# Patient Record
Sex: Male | Born: 1945 | Race: Black or African American | Hispanic: No | Marital: Married | Smoking: Former smoker
Health system: Southern US, Community
[De-identification: ages and names within clinical notes are randomized; demographics above are authoritative.]

## PROBLEM LIST (undated history)

## (undated) DIAGNOSIS — Z9289 Personal history of other medical treatment: Secondary | ICD-10-CM

## (undated) DIAGNOSIS — I10 Essential (primary) hypertension: Secondary | ICD-10-CM

## (undated) DIAGNOSIS — I5043 Acute on chronic combined systolic (congestive) and diastolic (congestive) heart failure: Secondary | ICD-10-CM

## (undated) DIAGNOSIS — IMO0001 Reserved for inherently not codable concepts without codable children: Secondary | ICD-10-CM

## (undated) DIAGNOSIS — W3400XA Accidental discharge from unspecified firearms or gun, initial encounter: Secondary | ICD-10-CM

## (undated) DIAGNOSIS — Z87442 Personal history of urinary calculi: Secondary | ICD-10-CM

## (undated) DIAGNOSIS — S21339A Puncture wound without foreign body of unspecified front wall of thorax with penetration into thoracic cavity, initial encounter: Secondary | ICD-10-CM

## (undated) DIAGNOSIS — M1990 Unspecified osteoarthritis, unspecified site: Secondary | ICD-10-CM

## (undated) DIAGNOSIS — K759 Inflammatory liver disease, unspecified: Secondary | ICD-10-CM

## (undated) DIAGNOSIS — R768 Other specified abnormal immunological findings in serum: Secondary | ICD-10-CM

## (undated) DIAGNOSIS — I4891 Unspecified atrial fibrillation: Secondary | ICD-10-CM

## (undated) DIAGNOSIS — I499 Cardiac arrhythmia, unspecified: Secondary | ICD-10-CM

## (undated) DIAGNOSIS — J189 Pneumonia, unspecified organism: Secondary | ICD-10-CM

## (undated) DIAGNOSIS — Z9889 Other specified postprocedural states: Secondary | ICD-10-CM

## (undated) HISTORY — PX: OTHER SURGICAL HISTORY: SHX169

---

## 1974-06-04 DIAGNOSIS — S21339A Puncture wound without foreign body of unspecified front wall of thorax with penetration into thoracic cavity, initial encounter: Secondary | ICD-10-CM

## 1974-06-04 HISTORY — DX: Puncture wound without foreign body of unspecified front wall of thorax with penetration into thoracic cavity, initial encounter: S21.339A

## 1975-06-05 DIAGNOSIS — M795 Residual foreign body in soft tissue: Secondary | ICD-10-CM

## 2007-02-27 ENCOUNTER — Emergency Department (HOSPITAL_COMMUNITY): Admission: EM | Admit: 2007-02-27 | Discharge: 2007-02-27 | Payer: Self-pay | Admitting: Emergency Medicine

## 2007-04-25 ENCOUNTER — Emergency Department (HOSPITAL_COMMUNITY): Admission: EM | Admit: 2007-04-25 | Discharge: 2007-04-25 | Payer: Self-pay | Admitting: Emergency Medicine

## 2008-05-16 ENCOUNTER — Emergency Department (HOSPITAL_COMMUNITY): Admission: EM | Admit: 2008-05-16 | Discharge: 2008-05-16 | Payer: Self-pay | Admitting: Emergency Medicine

## 2009-03-24 ENCOUNTER — Emergency Department (HOSPITAL_COMMUNITY): Admission: EM | Admit: 2009-03-24 | Discharge: 2009-03-24 | Payer: Self-pay | Admitting: Emergency Medicine

## 2010-10-18 ENCOUNTER — Emergency Department (HOSPITAL_COMMUNITY)
Admission: EM | Admit: 2010-10-18 | Discharge: 2010-10-18 | Disposition: A | Discharge status: Home / Self Care | Payer: Medicare Other | Attending: Emergency Medicine | Admitting: Emergency Medicine

## 2010-10-18 ENCOUNTER — Emergency Department (HOSPITAL_COMMUNITY): Payer: Medicare Other

## 2010-10-18 DIAGNOSIS — S59919A Unspecified injury of unspecified forearm, initial encounter: Secondary | ICD-10-CM | POA: Insufficient documentation

## 2010-10-18 DIAGNOSIS — Z79899 Other long term (current) drug therapy: Secondary | ICD-10-CM | POA: Insufficient documentation

## 2010-10-18 DIAGNOSIS — W010XXA Fall on same level from slipping, tripping and stumbling without subsequent striking against object, initial encounter: Secondary | ICD-10-CM | POA: Insufficient documentation

## 2010-10-18 DIAGNOSIS — M25439 Effusion, unspecified wrist: Secondary | ICD-10-CM | POA: Insufficient documentation

## 2010-10-18 DIAGNOSIS — S52599A Other fractures of lower end of unspecified radius, initial encounter for closed fracture: Secondary | ICD-10-CM | POA: Insufficient documentation

## 2010-10-18 DIAGNOSIS — S59909A Unspecified injury of unspecified elbow, initial encounter: Secondary | ICD-10-CM | POA: Insufficient documentation

## 2010-10-18 DIAGNOSIS — M25539 Pain in unspecified wrist: Secondary | ICD-10-CM | POA: Insufficient documentation

## 2010-10-18 DIAGNOSIS — S6990XA Unspecified injury of unspecified wrist, hand and finger(s), initial encounter: Secondary | ICD-10-CM | POA: Insufficient documentation

## 2010-10-18 DIAGNOSIS — M79609 Pain in unspecified limb: Secondary | ICD-10-CM | POA: Insufficient documentation

## 2010-10-18 DIAGNOSIS — M6250 Muscle wasting and atrophy, not elsewhere classified, unspecified site: Secondary | ICD-10-CM | POA: Insufficient documentation

## 2010-11-13 ENCOUNTER — Other Ambulatory Visit: Payer: Self-pay | Admitting: Family Medicine

## 2010-11-13 ENCOUNTER — Ambulatory Visit
Admission: RE | Admit: 2010-11-13 | Discharge: 2010-11-13 | Disposition: A | Discharge status: Home / Self Care | Payer: Medicare Other | Source: Ambulatory Visit | Attending: Family Medicine | Admitting: Family Medicine

## 2010-11-13 DIAGNOSIS — R05 Cough: Secondary | ICD-10-CM

## 2010-12-08 ENCOUNTER — Ambulatory Visit (HOSPITAL_COMMUNITY)
Admission: RE | Admit: 2010-12-08 | Discharge: 2010-12-08 | Disposition: A | Discharge status: Home / Self Care | Payer: Medicare Other | Source: Ambulatory Visit | Attending: Plastic Surgery | Admitting: Plastic Surgery

## 2010-12-08 ENCOUNTER — Other Ambulatory Visit (HOSPITAL_COMMUNITY): Payer: Self-pay | Admitting: Plastic Surgery

## 2010-12-08 DIAGNOSIS — T148XXA Other injury of unspecified body region, initial encounter: Secondary | ICD-10-CM

## 2010-12-08 DIAGNOSIS — IMO0001 Reserved for inherently not codable concepts without codable children: Secondary | ICD-10-CM | POA: Insufficient documentation

## 2011-01-12 ENCOUNTER — Emergency Department (HOSPITAL_COMMUNITY): Payer: BC Managed Care – PPO

## 2011-01-12 ENCOUNTER — Emergency Department (HOSPITAL_COMMUNITY)
Admission: EM | Admit: 2011-01-12 | Discharge: 2011-01-12 | Disposition: A | Discharge status: Home / Self Care | Payer: BC Managed Care – PPO | Attending: Emergency Medicine | Admitting: Emergency Medicine

## 2011-01-12 DIAGNOSIS — M899 Disorder of bone, unspecified: Secondary | ICD-10-CM | POA: Insufficient documentation

## 2011-01-12 DIAGNOSIS — M25539 Pain in unspecified wrist: Secondary | ICD-10-CM | POA: Insufficient documentation

## 2011-03-15 LAB — DIFFERENTIAL
Basophils Absolute: 0
Basophils Relative: 0
Eosinophils Relative: 4
Monocytes Absolute: 0.9 — ABNORMAL HIGH
Monocytes Relative: 9
Neutrophils Relative %: 60

## 2011-03-15 LAB — I-STAT 8, (EC8 V) (CONVERTED LAB)
Bicarbonate: 25.4 — ABNORMAL HIGH
Glucose, Bld: 107 — ABNORMAL HIGH
HCT: 50
Hemoglobin: 17
Potassium: 4.2
pCO2, Ven: 41.7 — ABNORMAL LOW
pH, Ven: 7.392 — ABNORMAL HIGH

## 2011-03-15 LAB — CBC
RBC: 4.95
RDW: 12.1

## 2012-01-07 ENCOUNTER — Encounter (HOSPITAL_COMMUNITY): Payer: Self-pay | Admitting: *Deleted

## 2012-01-07 ENCOUNTER — Emergency Department (HOSPITAL_COMMUNITY)
Admission: EM | Admit: 2012-01-07 | Discharge: 2012-01-08 | Disposition: A | Discharge status: Home / Self Care | Payer: Medicare Other | Attending: Emergency Medicine | Admitting: Emergency Medicine

## 2012-01-07 ENCOUNTER — Emergency Department (HOSPITAL_COMMUNITY): Payer: Medicare Other

## 2012-01-07 DIAGNOSIS — K115 Sialolithiasis: Secondary | ICD-10-CM | POA: Insufficient documentation

## 2012-01-07 DIAGNOSIS — I10 Essential (primary) hypertension: Secondary | ICD-10-CM | POA: Insufficient documentation

## 2012-01-07 HISTORY — DX: Essential (primary) hypertension: I10

## 2012-01-07 LAB — BASIC METABOLIC PANEL
CO2: 28 mEq/L (ref 19–32)
Chloride: 103 mEq/L (ref 96–112)
GFR calc Af Amer: >90 mL/min (ref >90)
Sodium: 138 mEq/L (ref 135–145)

## 2012-01-07 LAB — CBC WITH DIFFERENTIAL/PLATELET
Basophils Absolute: 0 10*3/uL (ref 0.0–0.1)
Basophils Relative: 1 % (ref 0–1)
Eosinophils Absolute: 0.4 10*3/uL (ref 0.0–0.7)
HCT: 41.2 % (ref 39.0–52.0)
Lymphs Abs: 3.6 10*3/uL (ref 0.7–4.0)
MCV: 93.2 fL (ref 78.0–100.0)
Neutrophils Relative %: 41 % — ABNORMAL LOW (ref 43–77)
Platelets: 159 10*3/uL (ref 150–400)
WBC: 7.7 10*3/uL (ref 4.0–10.5)

## 2012-01-07 MED ORDER — IOHEXOL 300 MG/ML  SOLN
75.0000 mL | Freq: Once | INTRAMUSCULAR | Status: AC | PRN
  Administered 2012-01-07: 75 mL via INTRAVENOUS

## 2012-01-07 NOTE — ED Notes (Signed)
Swollen lymph node lt submandibular area x 5 days,  Has dental pain also, tongue sore.

## 2012-01-08 MED ORDER — AMOXICILLIN-POT CLAVULANATE 500-125 MG PO TABS: 1.0000 | ORAL_TABLET | Freq: Three times a day (TID) | ORAL | Status: AC

## 2012-01-08 NOTE — ED Provider Notes (Signed)
Medical screening examination/treatment/procedure(s) were conducted as a shared visit with non-physician practitioner(s) and myself.  I personally evaluated the patient during the encounter   Patient with several days swelling, left mandibular area, following onset of dental pain, lower jaw. No similar in the past. No fever, or chills. On exam, he has a firm nodule, left submandibular a couple centimeters in front of the ankle of the jaw. The mass is minimally mobile. Dental and oral exam reveals poor dentition left lower. Palpation along the medial mandible reveals a discrete mass separate from the mandible that  is very tender. No drainage in the mouth. No focal dental abscesses appreciated.  MDM : CT scan reviewed by me reveals a calcification within or adjacent to the left submandibular gland. Patient stable for discharge. Treatment will include antibiotics, heat, and  ENT referral  Flint Melter, MD 01/08/12 [phone]
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
## 2012-03-13 ENCOUNTER — Ambulatory Visit (INDEPENDENT_AMBULATORY_CARE_PROVIDER_SITE_OTHER): Payer: Medicare Other | Admitting: Otolaryngology

## 2013-08-23 ENCOUNTER — Encounter (HOSPITAL_COMMUNITY): Payer: Self-pay | Admitting: Emergency Medicine

## 2013-08-23 ENCOUNTER — Emergency Department (HOSPITAL_COMMUNITY): Payer: Medicare HMO

## 2013-08-23 ENCOUNTER — Emergency Department (HOSPITAL_COMMUNITY)
Admission: EM | Admit: 2013-08-23 | Discharge: 2013-08-23 | Disposition: A | Discharge status: Home / Self Care | Payer: Medicare HMO | Attending: Emergency Medicine | Admitting: Emergency Medicine

## 2013-08-23 DIAGNOSIS — Z792 Long term (current) use of antibiotics: Secondary | ICD-10-CM | POA: Insufficient documentation

## 2013-08-23 DIAGNOSIS — R52 Pain, unspecified: Secondary | ICD-10-CM | POA: Insufficient documentation

## 2013-08-23 DIAGNOSIS — Z79899 Other long term (current) drug therapy: Secondary | ICD-10-CM | POA: Insufficient documentation

## 2013-08-23 DIAGNOSIS — J40 Bronchitis, not specified as acute or chronic: Secondary | ICD-10-CM

## 2013-08-23 DIAGNOSIS — R5383 Other fatigue: Secondary | ICD-10-CM

## 2013-08-23 DIAGNOSIS — J209 Acute bronchitis, unspecified: Secondary | ICD-10-CM | POA: Insufficient documentation

## 2013-08-23 DIAGNOSIS — R197 Diarrhea, unspecified: Secondary | ICD-10-CM | POA: Insufficient documentation

## 2013-08-23 DIAGNOSIS — I10 Essential (primary) hypertension: Secondary | ICD-10-CM | POA: Insufficient documentation

## 2013-08-23 DIAGNOSIS — R5381 Other malaise: Secondary | ICD-10-CM | POA: Insufficient documentation

## 2013-08-23 LAB — CBC WITH DIFFERENTIAL/PLATELET
BASOS ABS: 0 10*3/uL (ref 0.0–0.1)
BASOS PCT: 1 % (ref 0–1)
Eosinophils Absolute: 0 10*3/uL (ref 0.0–0.7)
Eosinophils Relative: 0 % (ref 0–5)
HEMATOCRIT: 48.4 % (ref 39.0–52.0)
HEMOGLOBIN: 16.3 g/dL (ref 13.0–17.0)
LYMPHS ABS: 1.2 10*3/uL (ref 0.7–4.0)
LYMPHS PCT: 22 % (ref 12–46)
MCH: 31.5 pg (ref 26.0–34.0)
MCHC: 33.7 g/dL (ref 30.0–36.0)
MCV: 93.6 fL (ref 78.0–100.0)
MONO ABS: 1.3 10*3/uL — AB (ref 0.1–1.0)
MONOS PCT: 22 % — AB (ref 3–12)
NEUTROS PCT: 55 % (ref 43–77)
Neutro Abs: 3.1 10*3/uL (ref 1.7–7.7)
Platelets: 160 10*3/uL (ref 150–400)
RBC: 5.17 MIL/uL (ref 4.22–5.81)
RDW: 12.2 % (ref 11.5–15.5)
WBC: 5.7 10*3/uL (ref 4.0–10.5)

## 2013-08-23 LAB — HEPATIC FUNCTION PANEL
ALT: 28 U/L (ref 0–53)
AST: 35 U/L (ref 0–37)
Albumin: 4 g/dL (ref 3.5–5.2)
Alkaline Phosphatase: 120 U/L — ABNORMAL HIGH (ref 39–117)
BILIRUBIN INDIRECT: 1 mg/dL — AB (ref 0.3–0.9)
Bilirubin, Direct: 0.3 mg/dL (ref 0.0–0.3)
TOTAL PROTEIN: 8.3 g/dL (ref 6.0–8.3)
Total Bilirubin: 1.3 mg/dL — ABNORMAL HIGH (ref 0.3–1.2)

## 2013-08-23 LAB — BASIC METABOLIC PANEL
BUN: 14 mg/dL (ref 6–23)
CALCIUM: 9.7 mg/dL (ref 8.4–10.5)
CO2: 23 mEq/L (ref 19–32)
Chloride: 97 mEq/L (ref 96–112)
Creatinine, Ser: 0.92 mg/dL (ref 0.50–1.35)
GFR calc non Af Amer: 85 mL/min — ABNORMAL LOW (ref >90)
GLUCOSE: 104 mg/dL — AB (ref 70–99)
POTASSIUM: 4.4 meq/L (ref 3.7–5.3)
Sodium: 135 mEq/L — ABNORMAL LOW (ref 137–147)

## 2013-08-23 MED ORDER — LEVOFLOXACIN 500 MG PO TABS: 500.0000 mg | ORAL_TABLET | Freq: Every day | ORAL | Status: DC

## 2013-08-23 MED ORDER — SODIUM CHLORIDE 0.9 % IV BOLUS (SEPSIS)
1000.0000 mL | Freq: Once | INTRAVENOUS | Status: AC
  Administered 2013-08-23: 1000 mL via INTRAVENOUS

## 2013-08-23 MED ORDER — KETOROLAC TROMETHAMINE 30 MG/ML IJ SOLN
30.0000 mg | Freq: Once | INTRAMUSCULAR | Status: AC
  Administered 2013-08-23: 30 mg via INTRAVENOUS
  Filled 2013-08-23: qty 1

## 2013-08-23 NOTE — ED Notes (Signed)
Pt c/o nonproductive cough, fatigue, diarrhea, nausea, and generalized body aches x 3 days.
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
## 2013-08-23 NOTE — Discharge Instructions (Signed)
Rest at home and drink plenty of fluids.  Follow up if not improving.

## 2013-08-26 ENCOUNTER — Encounter (HOSPITAL_COMMUNITY): Payer: Self-pay | Admitting: Emergency Medicine

## 2013-08-26 ENCOUNTER — Emergency Department (HOSPITAL_COMMUNITY)
Admission: EM | Admit: 2013-08-26 | Discharge: 2013-08-26 | Disposition: A | Discharge status: Home / Self Care | Payer: Medicare HMO | Attending: Emergency Medicine | Admitting: Emergency Medicine

## 2013-08-26 DIAGNOSIS — Z79899 Other long term (current) drug therapy: Secondary | ICD-10-CM | POA: Insufficient documentation

## 2013-08-26 DIAGNOSIS — Z792 Long term (current) use of antibiotics: Secondary | ICD-10-CM | POA: Insufficient documentation

## 2013-08-26 DIAGNOSIS — J40 Bronchitis, not specified as acute or chronic: Secondary | ICD-10-CM | POA: Insufficient documentation

## 2013-08-26 DIAGNOSIS — I10 Essential (primary) hypertension: Secondary | ICD-10-CM | POA: Insufficient documentation

## 2013-08-26 MED ORDER — ALBUTEROL SULFATE HFA 108 (90 BASE) MCG/ACT IN AERS
2.0000 | INHALATION_SPRAY | RESPIRATORY_TRACT | Status: DC | PRN
  Administered 2013-08-26: 2 via RESPIRATORY_TRACT
  Filled 2013-08-26: qty 6.7

## 2013-08-26 MED ORDER — HYDROCODONE-HOMATROPINE 5-1.5 MG/5ML PO SYRP
5.0000 mL | ORAL_SOLUTION | Freq: Four times a day (QID) | ORAL | Status: DC | PRN
Start: 2013-08-26 — End: 2014-12-28

## 2013-08-26 NOTE — Discharge Instructions (Signed)

## 2013-08-26 NOTE — ED Notes (Signed)
Respiratory paged for inhaler teaching prior to discharge.

## 2013-08-26 NOTE — ED Notes (Signed)
Pt reports was diagnosed with bronchitis Sunday and is supposed to go back to work tomorrow.  PT says feels some better but doesn't think can go back to work tomorrow.  PT says still having intermittent fevers, cough, and congestion.  Also c/o headaches and unable to tolerate 3 meals/day.

## 2013-08-26 NOTE — ED Provider Notes (Signed)
CSN: [id number]     Arrival date & time 08/26/13  1724 History  This chart was scribed for Dale B. Bernette MayersSheldon, MD by Bennett Scrapehristina Taylor, ED Scribe. This patient was seen in room APA08/APA08 and the patient's care was started at 6:38 PM.   Chief Complaint  Patient presents with  . Cough  . Nasal Congestion     The history is provided by the patient. No language interpreter was used.    HPI Comments: Lynann BeaverLeroy Sanabia is a 68 y.o. male who presents to the Emergency Department complaining of gradual onset, gradually worsening cough for the past week with associated intermittent fevers, chills and congestion. He reports that he was dx with bronchitis 3 days ago in the ED. He had a benign blood work up at the time. He was given antibiotics which he has been taking without improvement. He denies being given any pain medication or cough syrup. He states that he is back, because he has continued to have fevers and chills and the cough has become productive. The symptoms have been worse at night making it difficult to sleep. He denies any other sxs currently.   Past Medical History  Diagnosis Date  . Hypertension    Past Surgical History  Procedure Laterality Date  . Cyst on back     No family history on file. History  Substance Use Topics  . Smoking status: Never Smoker   . Smokeless tobacco: Not on file  . Alcohol Use: No    Review of Systems  A complete 10 system review of systems was obtained and all systems are negative except as noted in the HPI and PMH.    Allergies  Review of patient's allergies indicates no known allergies.  Home Medications   Current Outpatient Rx  Name  Route  Sig  Dispense  Refill  . acetaminophen (TYLENOL) 500 MG tablet   Oral   Take 1,000 mg by mouth as needed for mild pain, moderate pain or headache.          . Dextromethorphan-Guaifenesin (CORICIDIN HBP CONGESTION/COUGH) 10-200 MG CAPS   Oral   Take 2 tablets by mouth daily as needed (for cold and  congestion symptoms).         . fish oil-omega-3 fatty acids 1000 MG capsule   Oral   Take 1 g by mouth daily.          Marland Kitchen. levofloxacin (LEVAQUIN) 500 MG tablet   Oral   Take 1 tablet (500 mg total) by mouth daily.   7 tablet   0   . lisinopril (PRINIVIL,ZESTRIL) 40 MG tablet   Oral   Take 40 mg by mouth daily.         Marland Kitchen. loratadine (CLARITIN) 10 MG tablet   Oral   Take 10 mg by mouth daily.         . Multiple Vitamins-Minerals (ONE-A-DAY 50 PLUS PO)   Oral   Take 1 tablet by mouth daily.         Marland Kitchen. oxyCODONE-acetaminophen (PERCOCET) 10-325 MG per tablet   Oral   Take 1 tablet by mouth every 4 (four) hours as needed. For pain          Triage Vitals: BP 105/67  Pulse 102  Temp(Src) 98.4 F (36.9 C) (Oral)  Resp 20  Ht 5\' 8"  (1.727 m)  Wt 160 lb (72.576 kg)  BMI 24.33 kg/m2  SpO2 100%  Physical Exam  Nursing note and vitals reviewed. Constitutional: He is  oriented to person, place, and time. He appears well-developed and well-nourished.  HENT:  Head: Normocephalic and atraumatic.  Eyes: EOM are normal. Pupils are equal, round, and reactive to light.  Neck: Normal range of motion. Neck supple.  Cardiovascular: Normal rate, regular rhythm, normal heart sounds and intact distal pulses.   Pulmonary/Chest: Effort normal and breath sounds normal.  Abdominal: Bowel sounds are normal. He exhibits no distension. There is no tenderness.  Musculoskeletal: Normal range of motion. He exhibits no edema and no tenderness.  Neurological: He is alert and oriented to person, place, and time. He has normal strength. No cranial nerve deficit or sensory deficit.  Skin: Skin is warm and dry. No rash noted.  Psychiatric: He has a normal mood and affect.    ED Course  Procedures (including critical care time)  DIAGNOSTIC STUDIES: Oxygen Saturation is 100% on RA, normal by my interpretation.    COORDINATION OF CARE: 6:40 PM-Informed pt that bronchitis can last 4 to 6 weeks  and stated that if symptoms are viral, the antibiotics will not help. Discussed discharge plan which includes continuing antibiotics, inhaler and work note with pt and pt agreed to plan. Also advised pt to follow up as needed and pt agreed. Addressed symptoms to return for with pt.   Labs Review Labs Reviewed - No data to display Imaging Review No results found.   EKG Interpretation None      MDM   Final diagnoses:  Bronchitis  I personally performed the services described in this documentation, which was scribed in my presence. The recorded information has been reviewed and is accurate.         Dale B. Bernette Mayers, MD 09/04/13 [phone]

## 2013-08-26 NOTE — ED Provider Notes (Signed)
CSN: [id number]     Arrival date & time 08/26/13  1724 History   First MD Initiated Contact with Patient 08/26/13 1831     Chief Complaint  Patient presents with  . Cough  . Nasal Congestion     (Consider location/radiation/quality/duration/timing/severity/associated sxs/prior Treatment) HPI  Past Medical History  Diagnosis Date  . Hypertension    Past Surgical History  Procedure Laterality Date  . Cyst on back     No family history on file. History  Substance Use Topics  . Smoking status: Never Smoker   . Smokeless tobacco: Not on file  . Alcohol Use: No    Review of Systems    Allergies  Review of patient's allergies indicates no known allergies.  Home Medications   Current Outpatient Rx  Name  Route  Sig  Dispense  Refill  . acetaminophen (TYLENOL) 500 MG tablet   Oral   Take 1,000 mg by mouth as needed for mild pain, moderate pain or headache.          . fish oil-omega-3 fatty acids 1000 MG capsule   Oral   Take 1 g by mouth daily.          Marland Kitchen HYDROcodone-homatropine (HYCODAN) 5-1.5 MG/5ML syrup   Oral   Take 5 mLs by mouth every 6 (six) hours as needed for cough.   120 mL   0   . levofloxacin (LEVAQUIN) 500 MG tablet   Oral   Take 1 tablet (500 mg total) by mouth daily.   7 tablet   0   . lisinopril (PRINIVIL,ZESTRIL) 40 MG tablet   Oral   Take 40 mg by mouth daily.         Marland Kitchen loratadine (CLARITIN) 10 MG tablet   Oral   Take 10 mg by mouth daily.         . Multiple Vitamins-Minerals (ONE-A-DAY 50 PLUS PO)   Oral   Take 1 tablet by mouth daily.          BP 105/67  Pulse 102  Temp(Src) 98.4 F (36.9 C) (Oral)  Resp 20  Ht 5\' 8"  (1.727 m)  Wt 160 lb (72.576 kg)  BMI 24.33 kg/m2  SpO2 100% Physical Exam  ED Course  Procedures (including critical care time) Labs Review Labs Reviewed - No data to display Imaging Review No results found.   EKG Interpretation None      MDM   Final diagnoses:  Bronchitis   Pt  with recent neg workup for same. Has expected course for bronchitis, advised it may be several weeks before he stops coughing. Given cough syrup and albuterol for symptom control. Advised PCP followup.   Clemmie Buelna B. Bernette Mayers, MD 08/26/13 1850

## 2013-12-03 ENCOUNTER — Emergency Department (HOSPITAL_COMMUNITY)
Admission: EM | Admit: 2013-12-03 | Discharge: 2013-12-03 | Disposition: A | Discharge status: Home / Self Care | Payer: Medicare HMO | Attending: Emergency Medicine | Admitting: Emergency Medicine

## 2013-12-03 ENCOUNTER — Encounter (HOSPITAL_COMMUNITY): Payer: Self-pay | Admitting: Emergency Medicine

## 2013-12-03 ENCOUNTER — Emergency Department (HOSPITAL_COMMUNITY): Payer: Medicare HMO

## 2013-12-03 DIAGNOSIS — Z79899 Other long term (current) drug therapy: Secondary | ICD-10-CM | POA: Insufficient documentation

## 2013-12-03 DIAGNOSIS — N201 Calculus of ureter: Secondary | ICD-10-CM | POA: Insufficient documentation

## 2013-12-03 DIAGNOSIS — R109 Unspecified abdominal pain: Secondary | ICD-10-CM | POA: Insufficient documentation

## 2013-12-03 DIAGNOSIS — Z792 Long term (current) use of antibiotics: Secondary | ICD-10-CM | POA: Insufficient documentation

## 2013-12-03 DIAGNOSIS — I10 Essential (primary) hypertension: Secondary | ICD-10-CM | POA: Insufficient documentation

## 2013-12-03 DIAGNOSIS — Z791 Long term (current) use of non-steroidal anti-inflammatories (NSAID): Secondary | ICD-10-CM | POA: Insufficient documentation

## 2013-12-03 LAB — URINALYSIS, ROUTINE W REFLEX MICROSCOPIC
Bilirubin Urine: NEGATIVE
GLUCOSE, UA: NEGATIVE mg/dL
Ketones, ur: NEGATIVE mg/dL
Leukocytes, UA: NEGATIVE
Nitrite: NEGATIVE
Protein, ur: NEGATIVE mg/dL
Specific Gravity, Urine: 1.015 (ref 1.005–1.030)
UROBILINOGEN UA: 0.2 mg/dL (ref 0.0–1.0)
pH: 6 (ref 5.0–8.0)

## 2013-12-03 LAB — BASIC METABOLIC PANEL
Anion gap: 15 (ref 5–15)
BUN: 21 mg/dL (ref 6–23)
CHLORIDE: 104 meq/L (ref 96–112)
CO2: 23 mEq/L (ref 19–32)
Calcium: 9.3 mg/dL (ref 8.4–10.5)
Creatinine, Ser: 1.15 mg/dL (ref 0.50–1.35)
GFR calc non Af Amer: 64 mL/min — ABNORMAL LOW (ref >90)
GFR, EST AFRICAN AMERICAN: 74 mL/min — AB (ref >90)
GLUCOSE: 138 mg/dL — AB (ref 70–99)
Potassium: 3.7 mEq/L (ref 3.7–5.3)
Sodium: 142 mEq/L (ref 137–147)

## 2013-12-03 LAB — CBC
HEMATOCRIT: 45.5 % (ref 39.0–52.0)
HEMOGLOBIN: 15.2 g/dL (ref 13.0–17.0)
MCH: 31 pg (ref 26.0–34.0)
MCHC: 33.4 g/dL (ref 30.0–36.0)
MCV: 92.7 fL (ref 78.0–100.0)
Platelets: 148 10*3/uL — ABNORMAL LOW (ref 150–400)
RBC: 4.91 MIL/uL (ref 4.22–5.81)
RDW: 12.3 % (ref 11.5–15.5)
WBC: 6 10*3/uL (ref 4.0–10.5)

## 2013-12-03 LAB — URINE MICROSCOPIC-ADD ON

## 2013-12-03 MED ORDER — ONDANSETRON HCL 4 MG/2ML IJ SOLN
4.0000 mg | Freq: Once | INTRAMUSCULAR | Status: AC
  Administered 2013-12-03: 4 mg via INTRAVENOUS
  Filled 2013-12-03: qty 2

## 2013-12-03 MED ORDER — HYDROMORPHONE HCL PF 1 MG/ML IJ SOLN
1.0000 mg | INTRAMUSCULAR | Status: DC | PRN
  Administered 2013-12-03: 1 mg via INTRAVENOUS
  Filled 2013-12-03: qty 1

## 2013-12-03 MED ORDER — NAPROXEN 500 MG PO TABS: 500.0000 mg | ORAL_TABLET | Freq: Two times a day (BID) | ORAL | Status: DC

## 2013-12-03 MED ORDER — OXYCODONE-ACETAMINOPHEN 5-325 MG PO TABS: 1.0000 | ORAL_TABLET | ORAL | Status: DC | PRN

## 2013-12-03 MED ORDER — TAMSULOSIN HCL 0.4 MG PO CAPS: 0.4000 mg | ORAL_CAPSULE | Freq: Every day | ORAL | Status: DC

## 2013-12-03 MED ORDER — KETOROLAC TROMETHAMINE 30 MG/ML IJ SOLN
30.0000 mg | Freq: Once | INTRAMUSCULAR | Status: AC
  Administered 2013-12-03: 30 mg via INTRAVENOUS
  Filled 2013-12-03: qty 1

## 2013-12-03 MED ORDER — ONDANSETRON 8 MG PO TBDP: 8.0000 mg | ORAL_TABLET | Freq: Three times a day (TID) | ORAL | Status: DC | PRN

## 2013-12-03 NOTE — ED Provider Notes (Signed)
CSN: [id number]     Arrival date & time 12/03/13  0056 History   First MD Initiated Contact with Patient 12/03/13 0110     Chief Complaint  Patient presents with  . Emesis     HPI Patient reports acute onset left-sided flank pain with radiation towards his left groin.  With associated nausea vomiting.  This began this evening.  He's never had pain like this before.  No history kidney stones.  Denies diarrhea.  No fevers or chills.  No urinary complaints including no urinary frequency.  Pain is moderate to severe in severity.  Having difficulty finding a comfortable position during the history   Past Medical History  Diagnosis Date  . Hypertension    Past Surgical History  Procedure Laterality Date  . Cyst on back     History reviewed. No pertinent family history. History  Substance Use Topics  . Smoking status: Never Smoker   . Smokeless tobacco: Not on file  . Alcohol Use: No    Review of Systems  All other systems reviewed and are negative.     Allergies  Review of patient's allergies indicates no known allergies.  Home Medications   Prior to Admission medications   Medication Sig Start Date End Date Taking? Authorizing Provider  acetaminophen (TYLENOL) 500 MG tablet Take 1,000 mg by mouth as needed for mild pain, moderate pain or headache.    Yes Historical Provider, MD  fish oil-omega-3 fatty acids 1000 MG capsule Take 1 g by mouth daily.    Yes Historical Provider, MD  lisinopril (PRINIVIL,ZESTRIL) 40 MG tablet Take 40 mg by mouth daily.   Yes Historical Provider, MD  loratadine (CLARITIN) 10 MG tablet Take 10 mg by mouth daily. 06/05/13  Yes Historical Provider, MD  Multiple Vitamins-Minerals (ONE-A-DAY 50 PLUS PO) Take 1 tablet by mouth daily.   Yes Historical Provider, MD  HYDROcodone-homatropine (HYCODAN) 5-1.5 MG/5ML syrup Take 5 mLs by mouth every 6 (six) hours as needed for cough. 08/26/13   Charles B. Bernette Mayers, MD  levofloxacin (LEVAQUIN) 500 MG tablet Take 1  tablet (500 mg total) by mouth daily. 08/23/13   Benny Lennert, MD  naproxen (NAPROSYN) 500 MG tablet Take 1 tablet (500 mg total) by mouth 2 (two) times daily. 12/03/13   Lyanne Co, MD  ondansetron (ZOFRAN ODT) 8 MG disintegrating tablet Take 1 tablet (8 mg total) by mouth every 8 (eight) hours as needed for nausea or vomiting. 12/03/13   Lyanne Co, MD  oxyCODONE-acetaminophen (PERCOCET/ROXICET) 5-325 MG per tablet Take 1 tablet by mouth every 4 (four) hours as needed for severe pain. 12/03/13   Lyanne Co, MD  tamsulosin (FLOMAX) 0.4 MG CAPS capsule Take 1 capsule (0.4 mg total) by mouth daily. 12/03/13   Lyanne Co, MD   BP 147/103  Pulse 86  Temp(Src) 98.1 F (36.7 C) (Oral)  Ht 5\' 8"  (1.727 m)  Wt 151 lb (68.493 kg)  BMI 22.96 kg/m2  SpO2 98% Physical Exam  Nursing note and vitals reviewed. Constitutional: He is oriented to person, place, and time. He appears well-developed and well-nourished.  Uncomfortable appearing  HENT:  Head: Normocephalic and atraumatic.  Eyes: EOM are normal.  Neck: Normal range of motion.  Cardiovascular: Normal rate, regular rhythm, normal heart sounds and intact distal pulses.   Pulmonary/Chest: Effort normal and breath sounds normal. No respiratory distress.  Abdominal: Soft. He exhibits no distension. There is no tenderness.  Musculoskeletal: Normal range of motion.  Neurological: He is alert and oriented to person, place, and time.  Skin: Skin is warm and dry.  Psychiatric: He has a normal mood and affect. Judgment normal.    ED Course  Procedures (including critical care time) Labs Review Labs Reviewed  CBC - Abnormal; Notable for the following:    Platelets 148 (*)    All other components within normal limits  BASIC METABOLIC PANEL - Abnormal; Notable for the following:    Glucose, Bld 138 (*)    GFR calc non Af Amer 64 (*)    GFR calc Af Amer 74 (*)    All other components within normal limits  URINALYSIS, ROUTINE W REFLEX  MICROSCOPIC - Abnormal; Notable for the following:    Hgb urine dipstick MODERATE (*)    All other components within normal limits  URINE MICROSCOPIC-ADD ON - Abnormal; Notable for the following:    Squamous Epithelial / LPF FEW (*)    Bacteria, UA FEW (*)    All other components within normal limits    Imaging Review Ct Abdomen Pelvis Wo Contrast  12/03/2013   CLINICAL DATA:  Left flank pain.  EXAM: CT ABDOMEN AND PELVIS WITHOUT CONTRAST  TECHNIQUE: Multidetector CT imaging of the abdomen and pelvis was performed following the standard protocol without IV contrast.  COMPARISON:  None.  FINDINGS: BODY WALL: Unremarkable.  LOWER CHEST:  Mild atelectasis or scar at the left base.  ABDOMEN/PELVIS:  Liver: No focal abnormality.  Biliary: No evidence of biliary obstruction or stone.  Pancreas: Unremarkable.  Spleen: Unremarkable.  Adrenals: Unremarkable.  Kidneys and ureters: Left hydroureter and periureteric edema secondary to a 4 x 2 mm stone in the mid left ureter. No additional urolithiasis.  Bladder: Unremarkable.  Reproductive: Unremarkable.  Bowel: No obstruction. Normal appendix. Mild distal colonic diverticulosis.  Retroperitoneum: No mass or adenopathy.  Peritoneum: No ascites or pneumoperitoneum.  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 This could be postinflammatory given there is no osteophyte formation. There could be small, likely chronic, erosions in the left SI joint. No spondyloarthropathy changes in the spine.  IMPRESSION: 1. 4 mm mid left ureteral calculus causing hydroureter. 2. 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   Electronically Signed   By: Tiburcio PeaJonathan  Watts M.D.   On: 12/03/2013 02:29  I personally reviewed the imaging tests through PACS system I reviewed available ER/hospitalization records through the EMR    EKG Interpretation None       MDM   Final diagnoses:  Left ureteral stone    3:05 AM Pain controlled at this time.  Discharge home in good condition.  Outpatient neurology followup.  Discharge home in good condition.  He understands to return to the ER for new or worsening symptoms.  Standard stone precautions given    Lyanne CoKevin M Deborra Phegley, MD 12/03/13 [phone]

## 2013-12-03 NOTE — ED Notes (Signed)
Patient and patients spouse state understanding of discharge instructions, prescription medications, and follow up care. Patient ambulatory out of department at this time escorted by spouse.

## 2013-12-03 NOTE — ED Notes (Signed)
Patient states n/v X5. Pt c/o abdominal pain to left side that radiates into back. Patient is alert and oriented at this time. Pt vomited X1 once in pt room. Family at bedside at this time.

## 2013-12-03 NOTE — ED Notes (Signed)
Pt woke up with left abd pain/flank pain and vomiting.

## 2014-05-16 ENCOUNTER — Emergency Department (HOSPITAL_COMMUNITY)
Admission: EM | Admit: 2014-05-16 | Discharge: 2014-05-16 | Disposition: A | Discharge status: Home / Self Care | Payer: Medicare HMO | Attending: Emergency Medicine | Admitting: Emergency Medicine

## 2014-05-16 ENCOUNTER — Encounter (HOSPITAL_COMMUNITY): Payer: Self-pay | Admitting: *Deleted

## 2014-05-16 ENCOUNTER — Emergency Department (HOSPITAL_COMMUNITY): Payer: Medicare HMO

## 2014-05-16 DIAGNOSIS — I10 Essential (primary) hypertension: Secondary | ICD-10-CM | POA: Insufficient documentation

## 2014-05-16 DIAGNOSIS — Z791 Long term (current) use of non-steroidal anti-inflammatories (NSAID): Secondary | ICD-10-CM | POA: Insufficient documentation

## 2014-05-16 DIAGNOSIS — Z792 Long term (current) use of antibiotics: Secondary | ICD-10-CM | POA: Insufficient documentation

## 2014-05-16 DIAGNOSIS — R109 Unspecified abdominal pain: Secondary | ICD-10-CM

## 2014-05-16 DIAGNOSIS — Z79899 Other long term (current) drug therapy: Secondary | ICD-10-CM | POA: Insufficient documentation

## 2014-05-16 DIAGNOSIS — N23 Unspecified renal colic: Secondary | ICD-10-CM | POA: Insufficient documentation

## 2014-05-16 LAB — CBC WITH DIFFERENTIAL/PLATELET
Basophils Absolute: 0 10*3/uL (ref 0.0–0.1)
Basophils Relative: 1 % (ref 0–1)
EOS ABS: 0.3 10*3/uL (ref 0.0–0.7)
EOS PCT: 4 % (ref 0–5)
HCT: 42.6 % (ref 39.0–52.0)
Hemoglobin: 14 g/dL (ref 13.0–17.0)
LYMPHS PCT: 35 % (ref 12–46)
Lymphs Abs: 2.3 10*3/uL (ref 0.7–4.0)
MCH: 30.6 pg (ref 26.0–34.0)
MCHC: 32.9 g/dL (ref 30.0–36.0)
MCV: 93.2 fL (ref 78.0–100.0)
Monocytes Absolute: 0.5 10*3/uL (ref 0.1–1.0)
Monocytes Relative: 8 % (ref 3–12)
Neutro Abs: 3.4 10*3/uL (ref 1.7–7.7)
Neutrophils Relative %: 52 % (ref 43–77)
PLATELETS: 176 10*3/uL (ref 150–400)
RBC: 4.57 MIL/uL (ref 4.22–5.81)
RDW: 11.8 % (ref 11.5–15.5)
WBC: 6.6 10*3/uL (ref 4.0–10.5)

## 2014-05-16 LAB — URINALYSIS, ROUTINE W REFLEX MICROSCOPIC
Bilirubin Urine: NEGATIVE
Glucose, UA: NEGATIVE mg/dL
KETONES UR: NEGATIVE mg/dL
NITRITE: NEGATIVE
PROTEIN: NEGATIVE mg/dL
Specific Gravity, Urine: 1.02 (ref 1.005–1.030)
Urobilinogen, UA: 0.2 mg/dL (ref 0.0–1.0)
pH: 5.5 (ref 5.0–8.0)

## 2014-05-16 LAB — BASIC METABOLIC PANEL
ANION GAP: 13 (ref 5–15)
BUN: 23 mg/dL (ref 6–23)
CALCIUM: 9.3 mg/dL (ref 8.4–10.5)
CO2: 25 meq/L (ref 19–32)
Chloride: 105 mEq/L (ref 96–112)
Creatinine, Ser: 1.49 mg/dL — ABNORMAL HIGH (ref 0.50–1.35)
GFR calc Af Amer: 53 mL/min — ABNORMAL LOW (ref >90)
GFR, EST NON AFRICAN AMERICAN: 46 mL/min — AB (ref >90)
Glucose, Bld: 132 mg/dL — ABNORMAL HIGH (ref 70–99)
Potassium: 4 mEq/L (ref 3.7–5.3)
SODIUM: 143 meq/L (ref 137–147)

## 2014-05-16 LAB — URINE MICROSCOPIC-ADD ON

## 2014-05-16 MED ORDER — MORPHINE SULFATE 4 MG/ML IJ SOLN: 6.0000 mg | Freq: Once | INTRAMUSCULAR | Status: DC

## 2014-05-16 MED ORDER — TAMSULOSIN HCL 0.4 MG PO CAPS: 0.4000 mg | ORAL_CAPSULE | Freq: Every day | ORAL | Status: DC

## 2014-05-16 MED ORDER — MORPHINE SULFATE 4 MG/ML IJ SOLN
6.0000 mg | Freq: Once | INTRAMUSCULAR | Status: AC
  Administered 2014-05-16: 6 mg via INTRAVENOUS
  Filled 2014-05-16: qty 2

## 2014-05-16 MED ORDER — HYDROMORPHONE HCL 1 MG/ML IJ SOLN
1.0000 mg | Freq: Once | INTRAMUSCULAR | Status: AC
  Administered 2014-05-16: 1 mg via INTRAVENOUS
  Filled 2014-05-16: qty 1

## 2014-05-16 MED ORDER — ONDANSETRON HCL 4 MG/2ML IJ SOLN
4.0000 mg | Freq: Once | INTRAMUSCULAR | Status: AC
  Administered 2014-05-16: 4 mg via INTRAVENOUS
  Filled 2014-05-16: qty 2

## 2014-05-16 MED ORDER — SODIUM CHLORIDE 0.9 % IV BOLUS (SEPSIS)
1000.0000 mL | Freq: Once | INTRAVENOUS | Status: AC
  Administered 2014-05-16: 1000 mL via INTRAVENOUS

## 2014-05-16 MED ORDER — OXYCODONE-ACETAMINOPHEN 5-325 MG PO TABS: 1.0000 | ORAL_TABLET | ORAL | Status: DC | PRN

## 2014-05-16 NOTE — Discharge Instructions (Signed)
Called the urologist to schedule an appointment to be seen Kidney Stones Kidney stones (urolithiasis) are deposits that form inside your kidneys. The intense pain is caused by the stone moving through the urinary tract. When the stone moves, the ureter goes into spasm around the stone. The stone is usually passed in the urine.  CAUSES   A disorder that makes certain neck glands produce too much parathyroid hormone (primary hyperparathyroidism).  A buildup of uric acid crystals, similar to gout in your joints.  Narrowing (stricture) of the ureter.  A kidney obstruction present at birth (congenital obstruction).  Previous surgery on the kidney or ureters.  Numerous kidney infections. SYMPTOMS   Feeling sick to your stomach (nauseous).  Throwing up (vomiting).  Blood in the urine (hematuria).  Pain that usually spreads (radiates) to the groin.  Frequency or urgency of urination. DIAGNOSIS   Taking a history and physical exam.  Blood or urine tests.  CT scan.  Occasionally, an examination of the inside of the urinary bladder (cystoscopy) is performed. TREATMENT   Observation.  Increasing your fluid intake.  Extracorporeal shock wave lithotripsy--This is a noninvasive procedure that uses shock waves to break up kidney stones.  Surgery may be needed if you have severe pain or persistent obstruction. There are various surgical procedures. Most of the procedures are performed with the use of small instruments. Only small incisions are needed to accommodate these instruments, so recovery time is minimized. The size, location, and chemical composition are all important variables that will determine the proper choice of action for you. Talk to your health care provider to better understand your situation so that you will minimize the risk of injury to yourself and your kidney.  HOME CARE INSTRUCTIONS   Drink enough water and fluids to keep your urine clear or pale yellow. This  will help you to pass the stone or stone fragments.  Strain all urine through the provided strainer. Keep all particulate matter and stones for your health care provider to see. The stone causing the pain may be as small as a grain of salt. It is very important to use the strainer each and every time you pass your urine. The collection of your stone will allow your health care provider to analyze it and verify that a stone has actually passed. The stone analysis will often identify what you can do to reduce the incidence of recurrences.  Only take over-the-counter or prescription medicines for pain, discomfort, or fever as directed by your health care provider.  Make a follow-up appointment with your health care provider as directed.  Get follow-up X-rays if required. The absence of pain does not always mean that the stone has passed. It may have only stopped moving. If the urine remains completely obstructed, it can cause loss of kidney function or even complete destruction of the kidney. It is your responsibility to make sure X-rays and follow-ups are completed. Ultrasounds of the kidney can show blockages and the status of the kidney. Ultrasounds are not associated with any radiation and can be performed easily in a matter of minutes. SEEK MEDICAL CARE IF:  You experience pain that is progressive and unresponsive to any pain medicine you have been prescribed. SEEK IMMEDIATE MEDICAL CARE IF:   Pain cannot be controlled with the prescribed medicine.  You have a fever or shaking chills.  The severity or intensity of pain increases over 18 hours and is not relieved by pain medicine.  You develop a new onset  of abdominal pain.  You feel faint or pass out.  You are unable to urinate. MAKE SURE YOU:   Understand these instructions.  Will watch your condition.  Will get help right away if you are not doing well or get worse. Document Released: 05/21/2005 Document Revised: 01/21/2013  Document Reviewed: 10/22/2012 [facility] Patient Information 2015 [employer] [place]. This information is not intended to replace advice given to you by your health care provider. Make sure you discuss any questions you have with your health care provider.

## 2014-05-16 NOTE — ED Provider Notes (Signed)
10:30 PM Patient signed out to me by Dr. Patria Mane. Patient's abdominal CT shows left-sided hydronephrosis. Patient given additional dose of pain medication here will be placed on Flomax and pain meds and given referral to urology on call.  Toy Baker, MD 05/16/14 [phone]

## 2014-05-16 NOTE — ED Notes (Signed)
Pt states he was seen for kidney stones last time. Pt is c/o left flank pain radiating around to his abdomen. Pt has had n/v

## 2014-05-16 NOTE — ED Provider Notes (Signed)
CSN: [id number]     Arrival date & time 05/16/14  2038 History   First MD Initiated Contact with Patient 05/16/14 2105        HPI Patient ports pain with urination over the past 3 or 4 days and over the past 6-8 hours as developed some new left flank pain with radiation around to his left abdomen.  He's had nausea and vomiting.  No fevers or chills.  He has a history of kidney stones and states this feels similar.  He denies diarrhea.  No hematemesis.  Pain is moderate in severity.  Past Medical History  Diagnosis Date  . Hypertension    Past Surgical History  Procedure Laterality Date  . Cyst on back     History reviewed. No pertinent family history. History  Substance Use Topics  . Smoking status: Never Smoker   . Smokeless tobacco: Not on file  . Alcohol Use: No    Review of Systems  All other systems reviewed and are negative.     Allergies  Review of patient's allergies indicates no known allergies.  Home Medications   Prior to Admission medications   Medication Sig Start Date End Date Taking? Authorizing Provider  acetaminophen (TYLENOL) 500 MG tablet Take 1,000 mg by mouth as needed for mild pain, moderate pain or headache.     Historical Provider, MD  fish oil-omega-3 fatty acids 1000 MG capsule Take 1 g by mouth daily.     Historical Provider, MD  HYDROcodone-homatropine (HYCODAN) 5-1.5 MG/5ML syrup Take 5 mLs by mouth every 6 (six) hours as needed for cough. 08/26/13   Charles B. Bernette Mayers, MD  levofloxacin (LEVAQUIN) 500 MG tablet Take 1 tablet (500 mg total) by mouth daily. 08/23/13   Benny Lennert, MD  lisinopril (PRINIVIL,ZESTRIL) 40 MG tablet Take 40 mg by mouth daily.    Historical Provider, MD  loratadine (CLARITIN) 10 MG tablet Take 10 mg by mouth daily. 06/05/13   Historical Provider, MD  Multiple Vitamins-Minerals (ONE-A-DAY 50 PLUS PO) Take 1 tablet by mouth daily.    Historical Provider, MD  naproxen (NAPROSYN) 500 MG tablet Take 1 tablet (500 mg total)  by mouth 2 (two) times daily. 12/03/13   Lyanne Co, MD  ondansetron (ZOFRAN ODT) 8 MG disintegrating tablet Take 1 tablet (8 mg total) by mouth every 8 (eight) hours as needed for nausea or vomiting. 12/03/13   Lyanne Co, MD  oxyCODONE-acetaminophen (PERCOCET/ROXICET) 5-325 MG per tablet Take 1 tablet by mouth every 4 (four) hours as needed for severe pain. 12/03/13   Lyanne Co, MD  tamsulosin (FLOMAX) 0.4 MG CAPS capsule Take 1 capsule (0.4 mg total) by mouth daily. 12/03/13   Lyanne Co, MD   BP 142/80 mmHg  Pulse 88  Temp(Src) 98.7 F (37.1 C)  Resp 20  Ht 5\' 8"  (1.727 m)  Wt 155 lb (70.308 kg)  BMI 23.57 kg/m2  SpO2 98% Physical Exam  Constitutional: He is oriented to person, place, and time. He appears well-developed and well-nourished.  HENT:  Head: Normocephalic and atraumatic.  Eyes: EOM are normal.  Neck: Normal range of motion.  Cardiovascular: Normal rate, regular rhythm, normal heart sounds and intact distal pulses.   Pulmonary/Chest: Effort normal and breath sounds normal. No respiratory distress.  Abdominal: Soft. He exhibits no distension.  Mild left-sided abdominal tenderness  Musculoskeletal: Normal range of motion.  Neurological: He is alert and oriented to person, place, and time.  Skin: Skin is warm  and dry.  Psychiatric: He has a normal mood and affect. Judgment normal.  Nursing note and vitals reviewed.   ED Course  Procedures (including critical care time) Labs Review Labs Reviewed  CBC WITH DIFFERENTIAL  BASIC METABOLIC PANEL  URINALYSIS, ROUTINE W REFLEX MICROSCOPIC    Imaging Review No results found.   EKG Interpretation None      MDM   Final diagnoses:  Right flank pain   Urine, labs.  CT scan pending at this time.  Will control pain.  IV fluids and nausea as well.  Follow-up on imaging, labs, urine.  Care to Dr. Freida BusmanAllen.    Lyanne CoKevin M Yulia Ulrich, MD 05/16/14 2119

## 2014-05-31 MED FILL — Oxycodone w/ Acetaminophen Tab 5-325 MG: ORAL | Qty: 6 | Status: AC

## 2014-11-19 ENCOUNTER — Ambulatory Visit
Admission: RE | Admit: 2014-11-19 | Discharge: 2014-11-19 | Disposition: A | Discharge status: Home / Self Care | Payer: Medicare HMO | Source: Ambulatory Visit | Attending: Family Medicine | Admitting: Family Medicine

## 2014-11-19 ENCOUNTER — Other Ambulatory Visit: Payer: Self-pay | Admitting: Family Medicine

## 2014-11-19 DIAGNOSIS — M25511 Pain in right shoulder: Secondary | ICD-10-CM

## 2014-11-25 ENCOUNTER — Emergency Department (HOSPITAL_COMMUNITY): Payer: Medicare HMO

## 2014-11-25 ENCOUNTER — Encounter (HOSPITAL_COMMUNITY): Payer: Self-pay | Admitting: *Deleted

## 2014-11-25 ENCOUNTER — Emergency Department (HOSPITAL_COMMUNITY)
Admission: EM | Admit: 2014-11-25 | Discharge: 2014-11-26 | Disposition: A | Discharge status: Home / Self Care | Payer: Medicare HMO | Attending: Emergency Medicine | Admitting: Emergency Medicine

## 2014-11-25 DIAGNOSIS — I10 Essential (primary) hypertension: Secondary | ICD-10-CM | POA: Insufficient documentation

## 2014-11-25 DIAGNOSIS — N390 Urinary tract infection, site not specified: Secondary | ICD-10-CM | POA: Diagnosis not present

## 2014-11-25 DIAGNOSIS — Z79899 Other long term (current) drug therapy: Secondary | ICD-10-CM | POA: Insufficient documentation

## 2014-11-25 DIAGNOSIS — J159 Unspecified bacterial pneumonia: Secondary | ICD-10-CM | POA: Diagnosis not present

## 2014-11-25 DIAGNOSIS — J189 Pneumonia, unspecified organism: Secondary | ICD-10-CM

## 2014-11-25 DIAGNOSIS — R109 Unspecified abdominal pain: Secondary | ICD-10-CM | POA: Diagnosis present

## 2014-11-25 LAB — COMPREHENSIVE METABOLIC PANEL
ALBUMIN: 3.6 g/dL (ref 3.5–5.0)
ALT: 20 U/L (ref 17–63)
ANION GAP: 8 (ref 5–15)
AST: 29 U/L (ref 15–41)
Alkaline Phosphatase: 124 U/L (ref 38–126)
BUN: 17 mg/dL (ref 6–20)
CO2: 27 mmol/L (ref 22–32)
Calcium: 8.8 mg/dL — ABNORMAL LOW (ref 8.9–10.3)
Chloride: 106 mmol/L (ref 101–111)
Creatinine, Ser: 0.99 mg/dL (ref 0.61–1.24)
GFR calc Af Amer: >60 mL/min (ref >60)
GFR calc non Af Amer: >60 mL/min (ref >60)
Glucose, Bld: 94 mg/dL (ref 65–99)
Potassium: 4.2 mmol/L (ref 3.5–5.1)
Sodium: 141 mmol/L (ref 135–145)
TOTAL PROTEIN: 7.3 g/dL (ref 6.5–8.1)
Total Bilirubin: 1.4 mg/dL — ABNORMAL HIGH (ref 0.3–1.2)

## 2014-11-25 LAB — CBC WITH DIFFERENTIAL/PLATELET
BASOS PCT: 0 % (ref 0–1)
Basophils Absolute: 0 10*3/uL (ref 0.0–0.1)
Eosinophils Absolute: 0.2 10*3/uL (ref 0.0–0.7)
Eosinophils Relative: 2 % (ref 0–5)
HCT: 40.5 % (ref 39.0–52.0)
HEMOGLOBIN: 13 g/dL (ref 13.0–17.0)
LYMPHS ABS: 3 10*3/uL (ref 0.7–4.0)
Lymphocytes Relative: 40 % (ref 12–46)
MCH: 29.4 pg (ref 26.0–34.0)
MCHC: 32.1 g/dL (ref 30.0–36.0)
MCV: 91.6 fL (ref 78.0–100.0)
MONOS PCT: 11 % (ref 3–12)
Monocytes Absolute: 0.8 10*3/uL (ref 0.1–1.0)
NEUTROS PCT: 47 % (ref 43–77)
Neutro Abs: 3.4 10*3/uL (ref 1.7–7.7)
PLATELETS: 152 10*3/uL (ref 150–400)
RBC: 4.42 MIL/uL (ref 4.22–5.81)
RDW: 13.1 % (ref 11.5–15.5)
WBC: 7.3 10*3/uL (ref 4.0–10.5)

## 2014-11-25 LAB — URINALYSIS, ROUTINE W REFLEX MICROSCOPIC
Bilirubin Urine: NEGATIVE
Glucose, UA: NEGATIVE mg/dL
Hgb urine dipstick: NEGATIVE
Ketones, ur: NEGATIVE mg/dL
Nitrite: NEGATIVE
PROTEIN: NEGATIVE mg/dL
SPECIFIC GRAVITY, URINE: 1.025 (ref 1.005–1.030)
Urobilinogen, UA: 1 mg/dL (ref 0.0–1.0)
pH: 6.5 (ref 5.0–8.0)

## 2014-11-25 LAB — URINE MICROSCOPIC-ADD ON

## 2014-11-25 LAB — LIPASE, BLOOD: LIPASE: 24 U/L (ref 22–51)

## 2014-11-25 MED ORDER — IOHEXOL 300 MG/ML  SOLN
25.0000 mL | Freq: Once | INTRAMUSCULAR | Status: AC | PRN
  Administered 2014-11-25: 25 mL via ORAL

## 2014-11-25 MED ORDER — IOHEXOL 300 MG/ML  SOLN
75.0000 mL | Freq: Once | INTRAMUSCULAR | Status: AC | PRN
  Administered 2014-11-25: 75 mL via INTRAVENOUS

## 2014-11-25 MED ORDER — DEXTROSE 5 % IV SOLN
1.0000 g | Freq: Once | INTRAVENOUS | Status: AC
  Administered 2014-11-25: 1 g via INTRAVENOUS
  Filled 2014-11-25: qty 10
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
## 2014-11-25 NOTE — ED Notes (Addendum)
Pt reporting pain abdominal pain radiating into both sides since yesterday. Also reporting nausea for past couple days. Reports he's had some URI symptoms for several days.

## 2014-11-25 NOTE — ED Notes (Signed)
   11/25/14 2016  Abdominal  Gastrointestinal (WDL) X  Abdomen Inspection Soft  Tenderness Nontender  Last Bowel Movement Date 11/25/14  pt states intermittent pain to the right flank & abdomen that started 2 days ago. Pt states pain worse when coughs or takes a deep breath.

## 2014-11-26 MED ORDER — ONDANSETRON 8 MG PO TBDP: 8.0000 mg | ORAL_TABLET | Freq: Three times a day (TID) | ORAL | Status: DC | PRN

## 2014-11-26 MED ORDER — FENTANYL CITRATE (PF) 100 MCG/2ML IJ SOLN
INTRAMUSCULAR | Status: AC
  Filled 2014-11-26: qty 2

## 2014-11-26 MED ORDER — LEVOFLOXACIN 500 MG PO TABS: 500.0000 mg | ORAL_TABLET | Freq: Every day | ORAL | Status: DC

## 2014-11-26 MED ORDER — MORPHINE SULFATE 4 MG/ML IJ SOLN: 4.0000 mg | Freq: Once | INTRAMUSCULAR | Status: DC

## 2014-11-26 MED ORDER — OXYCODONE-ACETAMINOPHEN 5-325 MG PO TABS: 1.0000 | ORAL_TABLET | Freq: Once | ORAL | Status: DC

## 2014-11-26 MED ORDER — FENTANYL CITRATE (PF) 100 MCG/2ML IJ SOLN
50.0000 ug | Freq: Once | INTRAMUSCULAR | Status: AC
  Administered 2014-11-26: 50 ug via INTRAVENOUS

## 2014-11-26 MED ORDER — DEXTROSE 5 % IV SOLN
500.0000 mg | Freq: Once | INTRAVENOUS | Status: AC
  Administered 2014-11-26: 500 mg via INTRAVENOUS
  Filled 2014-11-26: qty 500

## 2014-11-26 NOTE — Discharge Instructions (Signed)
You have pneumonia on the left lung.  Additionally you have a minor urinary infection. Prescription for antibiotic and nausea medication. You can take your pain medicine at home as needed.  Return if worse or follow-up your primary care physician

## 2014-11-27 LAB — URINE CULTURE
Culture: NO GROWTH
Special Requests: NORMAL

## 2014-12-03 DIAGNOSIS — J189 Pneumonia, unspecified organism: Secondary | ICD-10-CM

## 2014-12-03 HISTORY — DX: Pneumonia, unspecified organism: J18.9

## 2014-12-28 ENCOUNTER — Encounter (HOSPITAL_COMMUNITY): Payer: Self-pay

## 2014-12-28 ENCOUNTER — Emergency Department (HOSPITAL_COMMUNITY): Payer: Medicare HMO

## 2014-12-28 ENCOUNTER — Emergency Department (HOSPITAL_COMMUNITY)
Admission: EM | Admit: 2014-12-28 | Discharge: 2014-12-28 | Disposition: A | Discharge status: Home / Self Care | Payer: Medicare HMO | Attending: Emergency Medicine | Admitting: Emergency Medicine

## 2014-12-28 DIAGNOSIS — Z87828 Personal history of other (healed) physical injury and trauma: Secondary | ICD-10-CM | POA: Insufficient documentation

## 2014-12-28 DIAGNOSIS — R0600 Dyspnea, unspecified: Secondary | ICD-10-CM | POA: Diagnosis not present

## 2014-12-28 DIAGNOSIS — I10 Essential (primary) hypertension: Secondary | ICD-10-CM | POA: Diagnosis not present

## 2014-12-28 DIAGNOSIS — R079 Chest pain, unspecified: Secondary | ICD-10-CM | POA: Diagnosis not present

## 2014-12-28 DIAGNOSIS — R05 Cough: Secondary | ICD-10-CM | POA: Diagnosis not present

## 2014-12-28 DIAGNOSIS — Z792 Long term (current) use of antibiotics: Secondary | ICD-10-CM | POA: Insufficient documentation

## 2014-12-28 DIAGNOSIS — M1990 Unspecified osteoarthritis, unspecified site: Secondary | ICD-10-CM | POA: Diagnosis not present

## 2014-12-28 DIAGNOSIS — Z79899 Other long term (current) drug therapy: Secondary | ICD-10-CM | POA: Diagnosis not present

## 2014-12-28 DIAGNOSIS — R7989 Other specified abnormal findings of blood chemistry: Secondary | ICD-10-CM | POA: Diagnosis not present

## 2014-12-28 DIAGNOSIS — R0602 Shortness of breath: Secondary | ICD-10-CM | POA: Diagnosis present

## 2014-12-28 HISTORY — DX: Accidental discharge from unspecified firearms or gun, initial encounter: W34.00XA

## 2014-12-28 HISTORY — DX: Puncture wound without foreign body of unspecified front wall of thorax with penetration into thoracic cavity, initial encounter: S21.339A

## 2014-12-28 HISTORY — DX: Unspecified osteoarthritis, unspecified site: M19.90

## 2014-12-28 LAB — CBC WITH DIFFERENTIAL/PLATELET
Basophils Absolute: 0 10*3/uL (ref 0.0–0.1)
Basophils Relative: 0 % (ref 0–1)
Eosinophils Absolute: 0.2 10*3/uL (ref 0.0–0.7)
Eosinophils Relative: 4 % (ref 0–5)
HEMATOCRIT: 39.1 % (ref 39.0–52.0)
Hemoglobin: 12.5 g/dL — ABNORMAL LOW (ref 13.0–17.0)
Lymphocytes Relative: 50 % — ABNORMAL HIGH (ref 12–46)
Lymphs Abs: 3 10*3/uL (ref 0.7–4.0)
MCH: 28.8 pg (ref 26.0–34.0)
MCHC: 32 g/dL (ref 30.0–36.0)
MCV: 90.1 fL (ref 78.0–100.0)
Monocytes Absolute: 0.6 10*3/uL (ref 0.1–1.0)
Monocytes Relative: 9 % (ref 3–12)
Neutro Abs: 2.2 10*3/uL (ref 1.7–7.7)
Neutrophils Relative %: 37 % — ABNORMAL LOW (ref 43–77)
Platelets: 148 10*3/uL — ABNORMAL LOW (ref 150–400)
RBC: 4.34 MIL/uL (ref 4.22–5.81)
RDW: 13 % (ref 11.5–15.5)
WBC: 6 10*3/uL (ref 4.0–10.5)

## 2014-12-28 LAB — BASIC METABOLIC PANEL
ANION GAP: 7 (ref 5–15)
BUN: 22 mg/dL — ABNORMAL HIGH (ref 6–20)
CALCIUM: 8.7 mg/dL — AB (ref 8.9–10.3)
CO2: 24 mmol/L (ref 22–32)
Chloride: 107 mmol/L (ref 101–111)
Creatinine, Ser: 1.01 mg/dL (ref 0.61–1.24)
GFR calc non Af Amer: >60 mL/min (ref >60)
GLUCOSE: 93 mg/dL (ref 65–99)
POTASSIUM: 3.9 mmol/L (ref 3.5–5.1)
Sodium: 138 mmol/L (ref 135–145)

## 2014-12-28 LAB — BRAIN NATRIURETIC PEPTIDE: B NATRIURETIC PEPTIDE 5: 338 pg/mL — AB (ref 0.0–100.0)

## 2014-12-28 LAB — TROPONIN I: Troponin I: <0.03 ng/mL (ref <0.031)

## 2014-12-28 LAB — LACTIC ACID, PLASMA: Lactic Acid, Venous: 0.7 mmol/L (ref 0.5–2.0)

## 2014-12-28 MED ORDER — ALBUTEROL SULFATE (2.5 MG/3ML) 0.083% IN NEBU
5.0000 mg | INHALATION_SOLUTION | Freq: Once | RESPIRATORY_TRACT | Status: AC
  Administered 2014-12-28: 5 mg via RESPIRATORY_TRACT
  Filled 2014-12-28: qty 6

## 2014-12-28 MED ORDER — FUROSEMIDE 20 MG PO TABS: 20.0000 mg | ORAL_TABLET | Freq: Every day | ORAL | Status: DC

## 2014-12-28 MED ORDER — ALBUTEROL SULFATE HFA 108 (90 BASE) MCG/ACT IN AERS
2.0000 | INHALATION_SPRAY | RESPIRATORY_TRACT | Status: DC | PRN
  Administered 2014-12-28: 2 via RESPIRATORY_TRACT
  Filled 2014-12-28: qty 6.7

## 2014-12-28 NOTE — ED Notes (Signed)
Patient states he was diagnosed with pneumonia 3 weeks ago and prescribed antibiotics and an inhaler. Patient states he improved initially, but has had an increase in shortness of breath and pain to the right chest.

## 2014-12-28 NOTE — Discharge Instructions (Signed)
Albuterol MDI: 2 puffs every 4 hours as needed for shortness of breath or wheezing.  Lasix as prescribed.  Follow-up with your primary Dr. in the next several days. He may want to order an echocardiogram to further evaluate her cardiac function.   Shortness of Breath Shortness of breath means you have trouble breathing. It could also mean that you have a medical problem. You should get immediate medical care for shortness of breath. CAUSES   Not enough oxygen in the air such as with high altitudes or a smoke-filled room.  Certain lung diseases, infections, or problems.  Heart disease or conditions, such as angina or heart failure.  Low red blood cells (anemia).  Poor physical fitness, which can cause shortness of breath when you exercise.  Chest or back injuries or stiffness.  Being overweight.  Smoking.  Anxiety, which can make you feel like you are not getting enough air. DIAGNOSIS  Serious medical problems can often be found during your physical exam. Tests may also be done to determine why you are having shortness of breath. Tests may include:  Chest X-rays.  Lung function tests.  Blood tests.  An electrocardiogram (ECG).  An ambulatory electrocardiogram. An ambulatory ECG records your heartbeat patterns over a 24-hour period.  Exercise testing.  A transthoracic echocardiogram (TTE). During echocardiography, sound waves are used to evaluate how blood flows through your heart.  A transesophageal echocardiogram (TEE).  Imaging scans. Your health care provider may not be able to find a cause for your shortness of breath after your exam. In this case, it is important to have a follow-up exam with your health care provider as directed.  TREATMENT  Treatment for shortness of breath depends on the cause of your symptoms and can vary greatly. HOME CARE INSTRUCTIONS   Do not smoke. Smoking is a common cause of shortness of breath. If you smoke, ask for help to  quit.  Avoid being around chemicals or things that may bother your breathing, such as paint fumes and dust.  Rest as needed. Slowly resume your usual activities.  If medicines were prescribed, take them as directed for the full length of time directed. This includes oxygen and any inhaled medicines.  Keep all follow-up appointments as directed by your health care provider. SEEK MEDICAL CARE IF:   Your condition does not improve in the time expected.  You have a hard time doing your normal activities even with rest.  You have any new symptoms. SEEK IMMEDIATE MEDICAL CARE IF:   Your shortness of breath gets worse.  You feel light-headed, faint, or develop a cough not controlled with medicines.  You start coughing up blood.  You have pain with breathing.  You have chest pain or pain in your arms, shoulders, or abdomen.  You have a fever.  You are unable to walk up stairs or exercise the way you normally do. MAKE SURE YOU:  Understand these instructions.  Will watch your condition.  Will get help right away if you are not doing well or get worse. Document Released: 02/13/2001 Document Revised: 05/26/2013 Document Reviewed: 08/06/2011 [facility] Patient Information 2015 [place], [place]. This information is not intended to replace advice given to you by your health care provider. Make sure you discuss any questions you have with your health care provider.
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
## 2015-03-05 ENCOUNTER — Emergency Department (HOSPITAL_COMMUNITY): Payer: Medicare HMO

## 2015-03-05 ENCOUNTER — Observation Stay (HOSPITAL_COMMUNITY)
Admission: EM | Admit: 2015-03-05 | Discharge: 2015-03-09 | Disposition: A | Discharge status: Home / Self Care | Payer: Medicare HMO | Attending: Cardiovascular Disease | Admitting: Cardiovascular Disease

## 2015-03-05 ENCOUNTER — Encounter (HOSPITAL_COMMUNITY): Payer: Self-pay | Admitting: Emergency Medicine

## 2015-03-05 DIAGNOSIS — Z87891 Personal history of nicotine dependence: Secondary | ICD-10-CM | POA: Diagnosis not present

## 2015-03-05 DIAGNOSIS — M1990 Unspecified osteoarthritis, unspecified site: Secondary | ICD-10-CM | POA: Insufficient documentation

## 2015-03-05 DIAGNOSIS — I428 Other cardiomyopathies: Secondary | ICD-10-CM | POA: Diagnosis not present

## 2015-03-05 DIAGNOSIS — I480 Paroxysmal atrial fibrillation: Principal | ICD-10-CM | POA: Insufficient documentation

## 2015-03-05 DIAGNOSIS — I5043 Acute on chronic combined systolic (congestive) and diastolic (congestive) heart failure: Secondary | ICD-10-CM | POA: Diagnosis not present

## 2015-03-05 DIAGNOSIS — Z792 Long term (current) use of antibiotics: Secondary | ICD-10-CM | POA: Insufficient documentation

## 2015-03-05 DIAGNOSIS — R109 Unspecified abdominal pain: Secondary | ICD-10-CM | POA: Diagnosis not present

## 2015-03-05 DIAGNOSIS — I10 Essential (primary) hypertension: Secondary | ICD-10-CM | POA: Diagnosis present

## 2015-03-05 DIAGNOSIS — I4891 Unspecified atrial fibrillation: Secondary | ICD-10-CM

## 2015-03-05 DIAGNOSIS — R0602 Shortness of breath: Secondary | ICD-10-CM | POA: Diagnosis present

## 2015-03-05 DIAGNOSIS — Z79899 Other long term (current) drug therapy: Secondary | ICD-10-CM | POA: Diagnosis not present

## 2015-03-05 DIAGNOSIS — R0600 Dyspnea, unspecified: Secondary | ICD-10-CM

## 2015-03-05 DIAGNOSIS — I5042 Chronic combined systolic (congestive) and diastolic (congestive) heart failure: Secondary | ICD-10-CM

## 2015-03-05 DIAGNOSIS — G8194 Hemiplegia, unspecified affecting left nondominant side: Secondary | ICD-10-CM | POA: Insufficient documentation

## 2015-03-05 DIAGNOSIS — I248 Other forms of acute ischemic heart disease: Secondary | ICD-10-CM | POA: Insufficient documentation

## 2015-03-05 DIAGNOSIS — Z79891 Long term (current) use of opiate analgesic: Secondary | ICD-10-CM | POA: Diagnosis not present

## 2015-03-05 HISTORY — DX: Acute on chronic combined systolic (congestive) and diastolic (congestive) heart failure: I50.43

## 2015-03-05 HISTORY — DX: Unspecified atrial fibrillation: I48.91

## 2015-03-05 LAB — CBC WITH DIFFERENTIAL/PLATELET
Basophils Absolute: 0 10*3/uL (ref 0.0–0.1)
Basophils Relative: 0 %
Eosinophils Absolute: 0.2 10*3/uL (ref 0.0–0.7)
Eosinophils Relative: 3 %
HEMATOCRIT: 38.9 % — AB (ref 39.0–52.0)
HEMOGLOBIN: 12.3 g/dL — AB (ref 13.0–17.0)
Lymphocytes Relative: 42 %
Lymphs Abs: 2.2 10*3/uL (ref 0.7–4.0)
MCH: 28.3 pg (ref 26.0–34.0)
MCHC: 31.6 g/dL (ref 30.0–36.0)
MCV: 89.6 fL (ref 78.0–100.0)
MONOS PCT: 10 %
Monocytes Absolute: 0.5 10*3/uL (ref 0.1–1.0)
NEUTROS ABS: 2.4 10*3/uL (ref 1.7–7.7)
NEUTROS PCT: 45 %
Platelets: 149 10*3/uL — ABNORMAL LOW (ref 150–400)
RBC: 4.34 MIL/uL (ref 4.22–5.81)
RDW: 13.5 % (ref 11.5–15.5)
WBC: 5.4 10*3/uL (ref 4.0–10.5)

## 2015-03-05 LAB — D-DIMER, QUANTITATIVE: D-Dimer, Quant: 0.59 ug/mL-FEU — ABNORMAL HIGH (ref 0.00–0.48)

## 2015-03-05 MED ORDER — IPRATROPIUM-ALBUTEROL 0.5-2.5 (3) MG/3ML IN SOLN
3.0000 mL | Freq: Once | RESPIRATORY_TRACT | Status: DC
  Filled 2015-03-05: qty 3

## 2015-03-05 MED ORDER — DILTIAZEM HCL 100 MG IV SOLR
5.0000 mg/h | INTRAVENOUS | Status: DC
  Administered 2015-03-05: 5 mg/h via INTRAVENOUS
  Filled 2015-03-05: qty 100

## 2015-03-05 MED ORDER — DILTIAZEM LOAD VIA INFUSION
10.0000 mg | Freq: Once | INTRAVENOUS | Status: AC
  Administered 2015-03-05: 10 mg via INTRAVENOUS
  Filled 2015-03-05: qty 10

## 2015-03-05 NOTE — ED Notes (Signed)
Patient complaining of shortness of breath that has been progressively worsening over the past 3 weeks. States is worse tonight. Denies pain. Also complains of dizziness.

## 2015-03-05 NOTE — ED Notes (Signed)
   03/05/15 2322  Respiratory  Respiratory (WDL) X  Bilateral Breath Sounds Diminished (in bases)  Respiratory Pattern Regular;Unlabored  Chest Assessment Chest expansion symmetrical  O2 Device Room Air  pt states increased SOB for the past several weeks & getting worse. Pt says sweating & a slight cough.
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
## 2015-03-05 NOTE — Progress Notes (Signed)
HR 214 neb held patient placed on oxygen

## 2015-03-06 ENCOUNTER — Emergency Department (HOSPITAL_COMMUNITY): Payer: Medicare HMO

## 2015-03-06 ENCOUNTER — Observation Stay (HOSPITAL_BASED_OUTPATIENT_CLINIC_OR_DEPARTMENT_OTHER): Payer: Medicare HMO

## 2015-03-06 DIAGNOSIS — I10 Essential (primary) hypertension: Secondary | ICD-10-CM | POA: Diagnosis not present

## 2015-03-06 DIAGNOSIS — I248 Other forms of acute ischemic heart disease: Secondary | ICD-10-CM | POA: Diagnosis not present

## 2015-03-06 DIAGNOSIS — S21339A Puncture wound without foreign body of unspecified front wall of thorax with penetration into thoracic cavity, initial encounter: Secondary | ICD-10-CM

## 2015-03-06 DIAGNOSIS — I5043 Acute on chronic combined systolic (congestive) and diastolic (congestive) heart failure: Secondary | ICD-10-CM | POA: Diagnosis not present

## 2015-03-06 DIAGNOSIS — I5042 Chronic combined systolic (congestive) and diastolic (congestive) heart failure: Secondary | ICD-10-CM

## 2015-03-06 DIAGNOSIS — W3400XA Accidental discharge from unspecified firearms or gun, initial encounter: Secondary | ICD-10-CM | POA: Insufficient documentation

## 2015-03-06 DIAGNOSIS — R0600 Dyspnea, unspecified: Secondary | ICD-10-CM | POA: Diagnosis present

## 2015-03-06 DIAGNOSIS — M1990 Unspecified osteoarthritis, unspecified site: Secondary | ICD-10-CM | POA: Insufficient documentation

## 2015-03-06 DIAGNOSIS — I4891 Unspecified atrial fibrillation: Secondary | ICD-10-CM | POA: Diagnosis present

## 2015-03-06 LAB — CBC
HEMATOCRIT: 37.2 % — AB (ref 39.0–52.0)
HEMOGLOBIN: 11.8 g/dL — AB (ref 13.0–17.0)
MCH: 28.5 pg (ref 26.0–34.0)
MCHC: 31.7 g/dL (ref 30.0–36.0)
MCV: 89.9 fL (ref 78.0–100.0)
PLATELETS: 156 10*3/uL (ref 150–400)
RBC: 4.14 MIL/uL — AB (ref 4.22–5.81)
RDW: 13.6 % (ref 11.5–15.5)
WBC: 5.6 10*3/uL (ref 4.0–10.5)

## 2015-03-06 LAB — BASIC METABOLIC PANEL
Anion gap: 6 (ref 5–15)
Anion gap: 7 (ref 5–15)
BUN: 16 mg/dL (ref 6–20)
BUN: 19 mg/dL (ref 6–20)
CHLORIDE: 108 mmol/L (ref 101–111)
CHLORIDE: 109 mmol/L (ref 101–111)
CO2: 24 mmol/L (ref 22–32)
CO2: 23 mmol/L (ref 22–32)
CREATININE: 1.21 mg/dL (ref 0.61–1.24)
Calcium: 8.1 mg/dL — ABNORMAL LOW (ref 8.9–10.3)
Calcium: 8.7 mg/dL — ABNORMAL LOW (ref 8.9–10.3)
Creatinine, Ser: 1.02 mg/dL (ref 0.61–1.24)
GFR calc Af Amer: >60 mL/min (ref >60)
GFR calc non Af Amer: 59 mL/min — ABNORMAL LOW (ref >60)
GFR calc non Af Amer: >60 mL/min (ref >60)
Glucose, Bld: 133 mg/dL — ABNORMAL HIGH (ref 65–99)
Glucose, Bld: 162 mg/dL — ABNORMAL HIGH (ref 65–99)
POTASSIUM: 4.5 mmol/L (ref 3.5–5.1)
Potassium: 3.8 mmol/L (ref 3.5–5.1)
SODIUM: 138 mmol/L (ref 135–145)
SODIUM: 139 mmol/L (ref 135–145)

## 2015-03-06 LAB — TSH: TSH: 2.025 u[IU]/mL (ref 0.350–4.500)

## 2015-03-06 LAB — TROPONIN I
TROPONIN I: 0.13 ng/mL — AB (ref <0.031)
TROPONIN I: 0.19 ng/mL — AB (ref <0.031)
TROPONIN I: 0.18 ng/mL — AB (ref <0.031)
Troponin I: 0.04 ng/mL — ABNORMAL HIGH (ref <0.031)

## 2015-03-06 LAB — LIPID PANEL
CHOLESTEROL: 139 mg/dL (ref 0–200)
HDL: 38 mg/dL — ABNORMAL LOW (ref >40)
LDL Cholesterol: 94 mg/dL (ref 0–99)
Total CHOL/HDL Ratio: 3.7 RATIO
Triglycerides: 37 mg/dL (ref <150)
VLDL: 7 mg/dL (ref 0–40)

## 2015-03-06 LAB — BRAIN NATRIURETIC PEPTIDE: B Natriuretic Peptide: 823 pg/mL — ABNORMAL HIGH (ref 0.0–100.0)

## 2015-03-06 MED ORDER — FUROSEMIDE 20 MG PO TABS
20.0000 mg | ORAL_TABLET | Freq: Every day | ORAL | Status: DC
  Administered 2015-03-06 – 2015-03-09 (×4): 20 mg via ORAL
  Filled 2015-03-06 (×4): qty 1

## 2015-03-06 MED ORDER — ONDANSETRON HCL 4 MG/2ML IJ SOLN: 4.0000 mg | Freq: Once | INTRAMUSCULAR | Status: DC

## 2015-03-06 MED ORDER — LISINOPRIL 5 MG PO TABS
5.0000 mg | ORAL_TABLET | Freq: Every day | ORAL | Status: DC
  Filled 2015-03-06: qty 1

## 2015-03-06 MED ORDER — ENOXAPARIN SODIUM 80 MG/0.8ML ~~LOC~~ SOLN
1.0000 mg/kg | Freq: Two times a day (BID) | SUBCUTANEOUS | Status: DC
  Administered 2015-03-06 (×2): 65 mg via SUBCUTANEOUS
  Filled 2015-03-06 (×2): qty 0.8

## 2015-03-06 MED ORDER — SODIUM CHLORIDE 0.9 % IV SOLN: 250.0000 mL | INTRAVENOUS | Status: DC | PRN

## 2015-03-06 MED ORDER — LISINOPRIL 10 MG PO TABS
40.0000 mg | ORAL_TABLET | Freq: Every day | ORAL | Status: DC
  Administered 2015-03-06: 40 mg via ORAL
  Filled 2015-03-06: qty 4

## 2015-03-06 MED ORDER — ACETAMINOPHEN 325 MG PO TABS: 650.0000 mg | ORAL_TABLET | ORAL | Status: DC | PRN

## 2015-03-06 MED ORDER — DILTIAZEM HCL 60 MG PO TABS
60.0000 mg | ORAL_TABLET | Freq: Three times a day (TID) | ORAL | Status: DC
  Administered 2015-03-06: 60 mg via ORAL
  Filled 2015-03-06: qty 1

## 2015-03-06 MED ORDER — ALBUTEROL SULFATE (2.5 MG/3ML) 0.083% IN NEBU
3.0000 mL | INHALATION_SOLUTION | RESPIRATORY_TRACT | Status: DC | PRN
Start: 2015-03-06 — End: 2015-03-09

## 2015-03-06 MED ORDER — HYDROMORPHONE HCL 1 MG/ML IJ SOLN
1.0000 mg | Freq: Once | INTRAMUSCULAR | Status: AC
  Administered 2015-03-06: 1 mg via INTRAVENOUS
  Filled 2015-03-06: qty 1

## 2015-03-06 MED ORDER — ASPIRIN EC 81 MG PO TBEC
81.0000 mg | DELAYED_RELEASE_TABLET | Freq: Every day | ORAL | Status: DC
  Administered 2015-03-06 – 2015-03-07 (×2): 81 mg via ORAL
  Filled 2015-03-06 (×2): qty 1

## 2015-03-06 MED ORDER — OXYCODONE-ACETAMINOPHEN 5-325 MG PO TABS
1.0000 | ORAL_TABLET | ORAL | Status: DC | PRN
  Administered 2015-03-06 – 2015-03-07 (×2): 1 via ORAL
  Filled 2015-03-06 (×2): qty 1

## 2015-03-06 MED ORDER — LORATADINE 10 MG PO TABS: 10.0000 mg | ORAL_TABLET | Freq: Every day | ORAL | Status: DC | PRN

## 2015-03-06 MED ORDER — SODIUM CHLORIDE 0.9 % IJ SOLN
3.0000 mL | Freq: Two times a day (BID) | INTRAMUSCULAR | Status: DC
  Administered 2015-03-06 – 2015-03-07 (×4): 3 mL via INTRAVENOUS

## 2015-03-06 MED ORDER — ATORVASTATIN CALCIUM 40 MG PO TABS
40.0000 mg | ORAL_TABLET | Freq: Every day | ORAL | Status: DC
  Administered 2015-03-06 – 2015-03-07 (×2): 40 mg via ORAL
  Filled 2015-03-06 (×2): qty 1

## 2015-03-06 MED ORDER — AMIODARONE HCL 200 MG PO TABS
400.0000 mg | ORAL_TABLET | Freq: Two times a day (BID) | ORAL | Status: DC
  Administered 2015-03-06 – 2015-03-09 (×7): 400 mg via ORAL
  Filled 2015-03-06 (×9): qty 2

## 2015-03-06 MED ORDER — ONDANSETRON HCL 4 MG/2ML IJ SOLN
4.0000 mg | Freq: Four times a day (QID) | INTRAMUSCULAR | Status: DC | PRN
  Administered 2015-03-06 – 2015-03-08 (×4): 4 mg via INTRAVENOUS
  Filled 2015-03-06 (×4): qty 2

## 2015-03-06 MED ORDER — ASPIRIN 81 MG PO CHEW: 324.0000 mg | CHEWABLE_TABLET | Freq: Once | ORAL | Status: DC

## 2015-03-06 MED ORDER — IOHEXOL 350 MG/ML SOLN
100.0000 mL | Freq: Once | INTRAVENOUS | Status: AC | PRN
  Administered 2015-03-06: 100 mL via INTRAVENOUS

## 2015-03-06 MED ORDER — ENOXAPARIN SODIUM 80 MG/0.8ML ~~LOC~~ SOLN
1.0000 mg/kg | Freq: Once | SUBCUTANEOUS | Status: AC
  Administered 2015-03-06: 70 mg via SUBCUTANEOUS
  Filled 2015-03-06: qty 0.8

## 2015-03-06 MED ORDER — SODIUM CHLORIDE 0.9 % IJ SOLN: 3.0000 mL | INTRAMUSCULAR | Status: DC | PRN

## 2015-03-06 NOTE — Progress Notes (Signed)
Amiodarone dropped in floor, another dose removed and given.
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
## 2015-03-06 NOTE — Progress Notes (Signed)
  Echocardiogram 2D Echocardiogram has been performed.  Aris Everts 03/06/2015, 9:41 AM

## 2015-03-06 NOTE — Progress Notes (Signed)
Vomited large amount of yellow fluid with undigested eggs and sausage.  Zofran given.
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
## 2015-03-06 NOTE — Progress Notes (Addendum)
PROGRESS NOTE  Dale [name] FAO:[id number] DOB: 1945/12/12 DOA: 03/05/2015 PCP: Karie Chimera, MD  Summary: 68yom presented with SOB/DOE for weeks and was found to be in rapid afib on admission with VR over 200. Subsequently converted to SR with diltiazem.   Assessment/Plan: 1. Atrial fibrillation with RVR converted to SR with diltiazem. CHA2DS2-VASc = 2. TSH WNL. Likely long-standing by history. On therapeutic Lovenox. 2. Profound systolic dysfunction, acute systolic/diastolic CHF, LVEF 15% with diffuse hypokinesis, grade 2 diastolic dysfunction. BP stable, no evidence of volume overload. Suspect tachycardia induced.  3. Elevated troponin, suspect demand ischemia/rate-related. No evidence of ACS. EKG with LVH with repolarization abnormalities which are chronic. 4. HTN 5. Tobacco use disorder in remission   Appears stable, pain free, no evidence of volume overload.  Continue to trend troponin  Add low dose ASA  Continue lisinopril and Lasix  Discussed above with Dr. Gala Romney, he recommends:  Amiodarone 400 mg BID  NOAC after cath  Remain at [facility] today, cardiology consult in AM with planned transfer to [facility] under cardiology.  Code Status: FULL DVT prophylaxis: Lovenox Family Communication: patient is alone, care plan was discussed. No questions at this time.  Disposition Plan: Home once improved   Brendia Sacks, MD  [facility]  Pager [phone] If 7PM-7AM, please contact night-coverage at [email or web address], password [facility] 03/06/2015, 6:12 AM    Consultants:  Cardiology  Procedures:  Echo Study Conclusions  - Left ventricle: The cavity size was severely dilated. Systolic function was normal. The estimated ejection fraction was 15%. Diffuse hypokinesis. There is akinesis of the entireinferoseptal myocardium. There is akinesis of the basalinferior myocardium. There is akinesis of the basal-midanteroseptal myocardium. Features are consistent with a  pseudonormal left ventricular filling pattern, with concomitant abnormal relaxation and increased filling pressure (grade 2 diastolic dysfunction). Doppler parameters are consistent with high ventricular filling pressure. - Aortic valve: Trileaflet; mildly thickened leaflets. - Mitral valve: There was mild regurgitation. - Left atrium: The atrium was severely dilated. - Recommendations: Cannot evaluate apex adequately enough to comment on apical LV thrombus. recommend limited study with definity contrast to further delineate.  Antibiotics:    HPI/Subjective: Feels okay, got some rest last night. Breathing better. No CP. Episode of emesis, thinks because of the pain medicine. Eating okay.    Objective: Filed Vitals:   03/06/15 0102 03/06/15 0130 03/06/15 0240 03/06/15 0243  BP: 100/73 112/75 113/81   Pulse: 100 103 100   Temp:   97.7 F (36.5 C)   TempSrc:   Oral   Resp: Height:    (1.727 m)   Weight:   65.59 kg (144 lb 9.6 oz)   SpO2: 100% 99% 100% 99%    Intake/Output Summary (Last 24 hours) at 03/06/15 0612 Last data filed at 03/06/15 0405  Gross per 24 hour  Intake      0 ml  Output    250 ml  Net   -250 ml     Filed Weights   03/05/15 2327 03/06/15 0240  Weight: 70.308 kg (155 lb) 65.59 kg (144 lb 9.6 oz)    Exam:     VSS, afebrile  General:  Appears comfortable, calm. Sitting up in chair  Eyes: PERRL, normal lids, irises ENT: grossly normal hearing, lips, tongue Cardiovascular: Regular rate and rhythm, no murmur, rub or gallop. No lower extremity edema. Telemetry: Sinus rhythm, 6 beat VT Respiratory: Clear to auscultation bilaterally, no wheezes, rales or rhonchi. Normal respiratory effort. Psychiatric: grossly normal  mood and affect, speech fluent and appropriate   New data reviewed:  EKG 10/2: SR, PAC, LVH with repolarization abnormality, LAE, cannot r/o septal MI, old  Troponin trending up 0.19  BMP and CBC  unremarkable  ECHO - Left ventricle: The cavity size was severely dilated. Systolic function was normal. The estimated ejection fraction was 15%. Diffuse hypokinesis. There is akinesis of the entireinferoseptal myocardium. There is akinesis of the basalinferior myocardium. There is akinesis of the basal-midanteroseptal myocardium. Features are consistent with a pseudonormal left ventricular filling pattern, with concomitant abnormal relaxation and increased filling pressure (grade 2 diastolic dysfunction). Doppler parameters are consistent with high ventricular filling pressure. - Aortic valve: Trileaflet; mildly thickened leaflets. - Mitral valve: There was mild regurgitation. - Left atrium: The atrium was severely dilated. - Recommendations: Cannot evaluate apex adequately enough to comment on apical LV thrombus. recommend limited study with definity contrast to further delineate.  Pertinent data since admission:  CTA chest: no PE, no CHF  CXR: independently reviewed, minimal interstitial edema, metal fragments, s/p sternotomy, thoracic scoliosis, osteopenia.  EKG 10/1: afib VR 206, LAD, LVH with repol abnormality, septal MI, lateral ST depression consider ischemia. Compared to 12/28/14, LVH with repol abn and lateral ST depression is old  Pending data:    Scheduled Meds: . aspirin EC  81 mg Oral Daily  . diltiazem  60 mg Oral 3 times per day  . furosemide  20 mg Oral Daily  . lisinopril  40 mg Oral Daily  . sodium chloride  3 mL Intravenous Q12H   Continuous Infusions:   Principal Problem:   Atrial fibrillation with RVR (HCC) Active Problems:   Hypertension   Acute on chronic combined systolic and diastolic CHF (congestive heart failure) (HCC)   Demand ischemia (HCC)   Time spent 35 minutes, >50% in counseling and coordination of care  By signing my name below, I, Arielle Khosrowpour, attest that this documentation has been prepared under the  direction and in the presence of Elfie Costanza P. Irene Limbo, MD. Electronically signed: Dawayne Cirri. 03/06/2015 10:31 AM   I personally performed the services described in this documentation. All medical record entries made by the scribe were at my direction. I have reviewed the chart and agree that the record reflects my personal performance and is accurate and complete. Brendia Sacks, MD

## 2015-03-07 ENCOUNTER — Encounter (HOSPITAL_COMMUNITY)
Admission: EM | Disposition: A | Discharge status: Home / Self Care | Payer: Self-pay | Source: Home / Self Care | Attending: Cardiovascular Disease

## 2015-03-07 ENCOUNTER — Encounter (HOSPITAL_COMMUNITY): Payer: Self-pay | Admitting: Adult Health

## 2015-03-07 DIAGNOSIS — I5043 Acute on chronic combined systolic (congestive) and diastolic (congestive) heart failure: Secondary | ICD-10-CM

## 2015-03-07 DIAGNOSIS — I739 Peripheral vascular disease, unspecified: Secondary | ICD-10-CM

## 2015-03-07 DIAGNOSIS — R0600 Dyspnea, unspecified: Secondary | ICD-10-CM

## 2015-03-07 DIAGNOSIS — I4891 Unspecified atrial fibrillation: Secondary | ICD-10-CM

## 2015-03-07 DIAGNOSIS — I248 Other forms of acute ischemic heart disease: Secondary | ICD-10-CM

## 2015-03-07 DIAGNOSIS — I519 Heart disease, unspecified: Secondary | ICD-10-CM

## 2015-03-07 DIAGNOSIS — I10 Essential (primary) hypertension: Secondary | ICD-10-CM

## 2015-03-07 HISTORY — PX: CARDIAC CATHETERIZATION: SHX172

## 2015-03-07 LAB — BASIC METABOLIC PANEL
Anion gap: 6 (ref 5–15)
BUN: 22 mg/dL — ABNORMAL HIGH (ref 6–20)
CALCIUM: 8.4 mg/dL — AB (ref 8.9–10.3)
CO2: 26 mmol/L (ref 22–32)
CREATININE: 1.09 mg/dL (ref 0.61–1.24)
Chloride: 108 mmol/L (ref 101–111)
Glucose, Bld: 116 mg/dL — ABNORMAL HIGH (ref 65–99)
Potassium: 4.3 mmol/L (ref 3.5–5.1)
SODIUM: 140 mmol/L (ref 135–145)

## 2015-03-07 LAB — CBC
HCT: 38.4 % — ABNORMAL LOW (ref 39.0–52.0)
Hemoglobin: 11.8 g/dL — ABNORMAL LOW (ref 13.0–17.0)
MCH: 27.9 pg (ref 26.0–34.0)
MCHC: 30.7 g/dL (ref 30.0–36.0)
MCV: 90.8 fL (ref 78.0–100.0)
PLATELETS: 160 10*3/uL (ref 150–400)
RBC: 4.23 MIL/uL (ref 4.22–5.81)
RDW: 13.7 % (ref 11.5–15.5)
WBC: 5.2 10*3/uL (ref 4.0–10.5)

## 2015-03-07 LAB — POCT I-STAT 3, ART BLOOD GAS (G3+)
ACID-BASE DEFICIT: 1 mmol/L (ref 0.0–2.0)
BICARBONATE: 25.1 meq/L — AB (ref 20.0–24.0)
O2 Saturation: 98 %
PH ART: 7.36 (ref 7.350–7.450)
TCO2: 26 mmol/L (ref 0–100)
pCO2 arterial: 44.4 mmHg (ref 35.0–45.0)
pO2, Arterial: 106 mmHg — ABNORMAL HIGH (ref 80.0–100.0)

## 2015-03-07 LAB — POCT I-STAT 3, VENOUS BLOOD GAS (G3P V)
BICARBONATE: 25.9 meq/L — AB (ref 20.0–24.0)
O2 SAT: 59 %
PCO2 VEN: 48 mmHg (ref 45.0–50.0)
PH VEN: 7.339 — AB (ref 7.250–7.300)
TCO2: 27 mmol/L (ref 0–100)
pO2, Ven: 33 mmHg (ref 30.0–45.0)

## 2015-03-07 SURGERY — RIGHT/LEFT HEART CATH AND CORONARY ANGIOGRAPHY

## 2015-03-07 MED ORDER — SODIUM CHLORIDE 0.9 % WEIGHT BASED INFUSION
1.0000 mL/kg/h | INTRAVENOUS | Status: AC
  Administered 2015-03-07: 1 mL/kg/h via INTRAVENOUS

## 2015-03-07 MED ORDER — CALCIUM CARBONATE ANTACID 500 MG PO CHEW
1.0000 | CHEWABLE_TABLET | ORAL | Status: DC | PRN
  Administered 2015-03-07: 200 mg via ORAL
  Filled 2015-03-07: qty 1

## 2015-03-07 MED ORDER — FENTANYL CITRATE (PF) 100 MCG/2ML IJ SOLN
INTRAMUSCULAR | Status: AC
  Filled 2015-03-07: qty 4

## 2015-03-07 MED ORDER — METOPROLOL SUCCINATE ER 25 MG PO TB24
25.0000 mg | ORAL_TABLET | Freq: Every day | ORAL | Status: DC
Start: 2015-03-08 — End: 2015-03-08
  Administered 2015-03-08: 25 mg via ORAL
  Filled 2015-03-07: qty 1

## 2015-03-07 MED ORDER — LIDOCAINE HCL (PF) 1 % IJ SOLN
INTRAMUSCULAR | Status: AC
  Filled 2015-03-07: qty 30

## 2015-03-07 MED ORDER — SODIUM CHLORIDE 0.9 % IJ SOLN: 3.0000 mL | INTRAMUSCULAR | Status: DC | PRN

## 2015-03-07 MED ORDER — MIDAZOLAM HCL 2 MG/2ML IJ SOLN
INTRAMUSCULAR | Status: DC | PRN
  Administered 2015-03-07: 1 mg via INTRAVENOUS

## 2015-03-07 MED ORDER — HEPARIN SODIUM (PORCINE) 5000 UNIT/ML IJ SOLN
5000.0000 [IU] | Freq: Three times a day (TID) | INTRAMUSCULAR | Status: DC
  Administered 2015-03-08: 5000 [IU] via SUBCUTANEOUS
  Filled 2015-03-07: qty 1

## 2015-03-07 MED ORDER — ONDANSETRON HCL 4 MG/2ML IJ SOLN
4.0000 mg | Freq: Four times a day (QID) | INTRAMUSCULAR | Status: DC | PRN
  Filled 2015-03-07: qty 2

## 2015-03-07 MED ORDER — MIDAZOLAM HCL 2 MG/2ML IJ SOLN
INTRAMUSCULAR | Status: AC
  Filled 2015-03-07: qty 4

## 2015-03-07 MED ORDER — METOPROLOL TARTRATE 25 MG PO TABS
12.5000 mg | ORAL_TABLET | Freq: Two times a day (BID) | ORAL | Status: DC
  Administered 2015-03-07: 12.5 mg via ORAL
  Filled 2015-03-07: qty 1

## 2015-03-07 MED ORDER — ASPIRIN 81 MG PO CHEW
81.0000 mg | CHEWABLE_TABLET | ORAL | Status: DC
  Filled 2015-03-07: qty 1

## 2015-03-07 MED ORDER — BISACODYL 10 MG RE SUPP: 10.0000 mg | Freq: Every day | RECTAL | Status: DC | PRN

## 2015-03-07 MED ORDER — MORPHINE SULFATE (PF) 2 MG/ML IV SOLN
2.0000 mg | INTRAVENOUS | Status: DC | PRN
  Administered 2015-03-07: 2 mg via INTRAVENOUS
  Filled 2015-03-07: qty 1

## 2015-03-07 MED ORDER — SODIUM CHLORIDE 0.9 % IV SOLN: INTRAVENOUS | Status: DC

## 2015-03-07 MED ORDER — ACETAMINOPHEN 325 MG PO TABS: 650.0000 mg | ORAL_TABLET | ORAL | Status: DC | PRN

## 2015-03-07 MED ORDER — OXYCODONE-ACETAMINOPHEN 10-325 MG PO TABS
1.0000 | ORAL_TABLET | ORAL | Status: DC | PRN
  Filled 2015-03-07: qty 2

## 2015-03-07 MED ORDER — SODIUM CHLORIDE 0.9 % IJ SOLN
3.0000 mL | Freq: Two times a day (BID) | INTRAMUSCULAR | Status: DC
  Administered 2015-03-07 – 2015-03-08 (×3): 3 mL via INTRAVENOUS

## 2015-03-07 MED ORDER — SODIUM CHLORIDE 0.9 % IV SOLN: 250.0000 mL | INTRAVENOUS | Status: DC | PRN

## 2015-03-07 MED ORDER — LIDOCAINE HCL (PF) 1 % IJ SOLN
INTRAMUSCULAR | Status: DC | PRN
  Administered 2015-03-07: 16:00:00

## 2015-03-07 MED ORDER — FENTANYL CITRATE (PF) 100 MCG/2ML IJ SOLN
INTRAMUSCULAR | Status: DC | PRN
  Administered 2015-03-07: 25 ug via INTRAVENOUS

## 2015-03-07 MED ORDER — SIMETHICONE 80 MG PO CHEW: 80.0000 mg | CHEWABLE_TABLET | Freq: Four times a day (QID) | ORAL | Status: DC | PRN

## 2015-03-07 MED ORDER — ASPIRIN 81 MG PO CHEW
81.0000 mg | CHEWABLE_TABLET | Freq: Every day | ORAL | Status: DC
  Administered 2015-03-08: 81 mg via ORAL
  Filled 2015-03-07: qty 1

## 2015-03-07 MED ORDER — HEPARIN (PORCINE) IN NACL 2-0.9 UNIT/ML-% IJ SOLN
INTRAMUSCULAR | Status: AC
  Filled 2015-03-07: qty 1000

## 2015-03-07 MED ORDER — SODIUM CHLORIDE 0.9 % IJ SOLN: 3.0000 mL | Freq: Two times a day (BID) | INTRAMUSCULAR | Status: DC

## 2015-03-07 MED ORDER — LIDOCAINE HCL (PF) 1 % IJ SOLN
INTRAMUSCULAR | Status: DC | PRN
  Administered 2015-03-07: 15 mL

## 2015-03-07 MED ORDER — OXYCODONE-ACETAMINOPHEN 5-325 MG PO TABS: 1.0000 | ORAL_TABLET | ORAL | Status: DC | PRN

## 2015-03-07 MED ORDER — FLUTICASONE PROPIONATE 50 MCG/ACT NA SUSP
1.0000 | Freq: Every day | NASAL | Status: DC
  Administered 2015-03-08: 1 via NASAL
  Filled 2015-03-07: qty 16

## 2015-03-07 MED ORDER — OXYCODONE HCL 5 MG PO TABS
5.0000 mg | ORAL_TABLET | ORAL | Status: DC | PRN
  Administered 2015-03-07: 10 mg via ORAL
  Administered 2015-03-08: 5 mg via ORAL
  Filled 2015-03-07 (×2): qty 2

## 2015-03-07 MED ORDER — LISINOPRIL 5 MG PO TABS
2.5000 mg | ORAL_TABLET | Freq: Every day | ORAL | Status: DC
Start: 2015-03-07 — End: 2015-03-07
  Filled 2015-03-07: qty 1

## 2015-03-07 SURGICAL SUPPLY — 16 items
CATH INFINITI 5FR MULTPACK ANG (CATHETERS) ×3 IMPLANT
CATH SWAN GANZ 7F STRAIGHT (CATHETERS) ×3 IMPLANT
DEVICE CLOSURE MYNXGRIP 5F (Vascular Products) ×3 IMPLANT
DEVICE RAD COMP TR BAND LRG (VASCULAR PRODUCTS) ×3 IMPLANT
GLIDESHEATH SLEND A-KIT 6F 22G (SHEATH) ×3 IMPLANT
KIT HEART LEFT (KITS) ×3 IMPLANT
KIT HEART RIGHT NAMIC (KITS) ×3 IMPLANT
PACK CARDIAC CATHETERIZATION (CUSTOM PROCEDURE TRAY) ×3 IMPLANT
SHEATH PINNACLE 5F 10CM (SHEATH) ×3 IMPLANT
SHEATH PINNACLE 7F 10CM (SHEATH) ×3 IMPLANT
SYR MEDRAD MARK V 150ML (SYRINGE) ×3 IMPLANT
TRANSDUCER W/STOPCOCK (MISCELLANEOUS) ×3 IMPLANT
TUBING CIL FLEX 10 FLL-RA (TUBING) ×3 IMPLANT
WIRE EMERALD 3MM-J .025X260CM (WIRE) ×3 IMPLANT
WIRE EMERALD 3MM-J .035X150CM (WIRE) ×3 IMPLANT
WIRE HI TORQ VERSACORE-J 145CM (WIRE) ×3 IMPLANT

## 2015-03-07 NOTE — Care Management Note (Signed)
Case Management Note  Patient Details  Name: Dale [name] MRN: [id number] Date of Birth: 03-13-1946  Subjective/Objective:                  Pt admitted from home with CP. Pt lives with his wife and will return home at discharge. Pt is independent with ADL's.  Action/Plan: Pt for transfer to [facility] for cath. CM on receiving unit to follow for discharge planning needs.  Expected Discharge Date:  03/08/15               Expected Discharge Plan:  Acute to Acute Transfer  In-House Referral:  NA  Discharge planning Services  CM Consult  Post Acute Care Choice:  NA Choice offered to:  NA  DME Arranged:    DME Agency:     HH Arranged:    HH Agency:     Status of Service:  Completed, signed off  Medicare Important Message Given:    Date Medicare IM Given:    Medicare IM give by:    Date Additional Medicare IM Given:    Additional Medicare Important Message give by:     If discussed at Long Length of Stay Meetings, dates discussed:    Additional Comments:  Cheryl Flash, RN 03/07/2015, 10:35 AM
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
## 2015-03-07 NOTE — Progress Notes (Signed)
PROGRESS NOTE  Dale [name] ZOX:[id number] DOB: 08-01-1945 DOA: 03/05/2015 PCP: Karie Chimera, MD  Summary: 68yom presented with SOB/DOE for weeks and was found to be in rapid afib on admission with VR over 200. Subsequently converted to SR with diltiazem.   Assessment/Plan: 1. Atrial fibrillation with RVR converted to SR with diltiazem. Remains in SR. CHA2DS2-VASc = 4. TSH WNL. Likely long-standing by history. On therapeutic Lovenox. 2. Profound systolic dysfunction, acute systolic/diastolic CHF, LVEF 15% with diffuse hypokinesis, grade 2 diastolic dysfunction. BP remains stable, and there continues to be no evidence of volume overload. Suspect tachycardia induced. Appears compensated.  3. Elevated troponin, suspect demand ischemia/rate-related. Remains SR/ST. No evidence of ACS. EKG with LVH with repolarization abnormalities which are chronic. 4. HTN 5. Tobacco use disorder in remission.    Overall doing well.  Continue cardiac medications  Transfer to [facility] under cardiac service.   Code Status: FULL DVT prophylaxis: Lovenox Family Communication: patient is alone, care plan was discussed. No questions at this time.  Disposition Plan: Transfer to [facility]   Brendia Sacks, MD  [facility]  Pager [phone] If 7PM-7AM, please contact night-coverage at [email or web address], password [facility] 03/07/2015, 6:33 AM    Consultants:  Cardiology  Procedures:  Echo Study Conclusions  - Left ventricle: The cavity size was severely dilated. Systolic function was normal. The estimated ejection fraction was 15%. Diffuse hypokinesis. There is akinesis of the entireinferoseptal myocardium. There is akinesis of the basalinferior myocardium. There is akinesis of the basal-midanteroseptal myocardium. Features are consistent with a pseudonormal left ventricular filling pattern, with concomitant abnormal relaxation and increased filling pressure (grade 2 diastolic  dysfunction). Doppler parameters are consistent with high ventricular filling pressure. - Aortic valve: Trileaflet; mildly thickened leaflets. - Mitral valve: There was mild regurgitation. - Left atrium: The atrium was severely dilated. - Recommendations: Cannot evaluate apex adequately enough to comment on apical LV thrombus. recommend limited study with definity contrast to further delineate.  Antibiotics:    HPI/Subjective: Breathing has improved. Denies any nausea, vomiting. Was given a TUMs this morning that seemed to help.   Objective: Filed Vitals:   03/06/15 1514 03/06/15 2147 03/07/15 0542 03/07/15 0554  BP: 110/69 103/73 105/70   Pulse: 95 94 96   Temp: 97.7 F (36.5 C) 98.2 F (36.8 C) 98.6 F (37 C)   TempSrc: Oral Oral Oral   Resp: 20 20    Height:      Weight:    64.5 kg (142 lb 3.2 oz)  SpO2: 99% 99% 99%     Intake/Output Summary (Last 24 hours) at 03/07/15 0633 Last data filed at 03/06/15 1747  Gross per 24 hour  Intake    723 ml  Output    300 ml  Net    423 ml     Filed Weights   03/05/15 2327 03/06/15 0240 03/07/15 0554  Weight: 70.308 kg (155 lb) 65.59 kg (144 lb 9.6 oz) 64.5 kg (142 lb 3.2 oz)    Exam:  VSS, afebrile, [place]  General:  Appears calm and comfortable. Sitting on the edge of the bed.  Cardiovascular: RRR, no m/r/g. No LE edema. Telemetry: SR, no arrhythmias  Respiratory: CTA bilaterally, no w/r/r. Normal respiratory effort. Psychiatric: grossly normal mood and affect, speech fluent and appropriate  New data reviewed:  BMP and CBC unremarkable  Troponin remains flat .18  Pertinent data since admission:  CTA chest: no PE, no CHF  CXR: independently reviewed, minimal interstitial edema, metal fragments, s/p sternotomy, thoracic scoliosis,  osteopenia.  EKG 10/1: afib VR 206, LAD, LVH with repol abnormality, septal MI, lateral ST depression consider ischemia. Compared to 12/28/14, LVH with repol abn and lateral ST  depression is old  Pending data:    Scheduled Meds: . amiodarone  400 mg Oral BID  . aspirin EC  81 mg Oral Daily  . atorvastatin  40 mg Oral q1800  . enoxaparin (LOVENOX) injection  1 mg/kg Subcutaneous Q12H  . furosemide  20 mg Oral Daily  . lisinopril  5 mg Oral Daily  . sodium chloride  3 mL Intravenous Q12H   Continuous Infusions:   Principal Problem:   Atrial fibrillation with RVR (HCC) Active Problems:   Hypertension   Acute on chronic combined systolic and diastolic CHF (congestive heart failure) (HCC)   Demand ischemia (HCC)  Time spent: 25 minutes    By signing my name below, I, Zadie Cleverly attest that this documentation has been prepared under the direction and in the presence of Brendia Sacks, MD Electronically signed: Zadie Cleverly  03/07/2015 9:40AM    I personally performed the services described in this documentation. All medical record entries made by the scribe were at my direction. I have reviewed the chart and agree that the record reflects my personal performance and is accurate and complete. Brendia Sacks, MD

## 2015-03-07 NOTE — Interval H&P Note (Signed)
Cath Lab Visit (complete for each Cath Lab visit)  Clinical Evaluation Leading to the Procedure:   ACS: Yes.    Non-ACS:    Anginal Classification: CCS IV  Anti-ischemic medical therapy: No Therapy  Non-Invasive Test Results: No non-invasive testing performed  Prior CABG: No previous CABG      History and Physical Interval Note:  03/07/2015 3:18 PM  Dale [name]  has presented today for surgery, with the diagnosis of chf  The various methods of treatment have been discussed with the patient and family. After consideration of risks, benefits and other options for treatment, the patient has consented to  Procedure(s): Right/Left Heart Cath and Coronary Angiography (N/A) as a surgical intervention .  The patient's history has been reviewed, patient examined, no change in status, stable for surgery.  I have reviewed the patient's chart and labs.  Questions were answered to the patient's satisfaction.     Nanetta Batty
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
## 2015-03-08 ENCOUNTER — Encounter (HOSPITAL_COMMUNITY): Payer: Self-pay | Admitting: Cardiovascular Disease

## 2015-03-08 DIAGNOSIS — I4891 Unspecified atrial fibrillation: Secondary | ICD-10-CM | POA: Diagnosis not present

## 2015-03-08 LAB — BASIC METABOLIC PANEL
ANION GAP: 8 (ref 5–15)
BUN: 24 mg/dL — AB (ref 6–20)
CO2: 26 mmol/L (ref 22–32)
Calcium: 9 mg/dL (ref 8.9–10.3)
Chloride: 106 mmol/L (ref 101–111)
Creatinine, Ser: 1.29 mg/dL — ABNORMAL HIGH (ref 0.61–1.24)
GFR, EST NON AFRICAN AMERICAN: 55 mL/min — AB (ref >60)
Glucose, Bld: 100 mg/dL — ABNORMAL HIGH (ref 65–99)
POTASSIUM: 4.2 mmol/L (ref 3.5–5.1)
SODIUM: 140 mmol/L (ref 135–145)

## 2015-03-08 LAB — CBC
HEMATOCRIT: 37.8 % — AB (ref 39.0–52.0)
HEMOGLOBIN: 11.7 g/dL — AB (ref 13.0–17.0)
MCH: 28.1 pg (ref 26.0–34.0)
MCHC: 31 g/dL (ref 30.0–36.0)
MCV: 90.6 fL (ref 78.0–100.0)
PLATELETS: 163 10*3/uL (ref 150–400)
RBC: 4.17 MIL/uL — ABNORMAL LOW (ref 4.22–5.81)
RDW: 13.9 % (ref 11.5–15.5)
WBC: 6.7 10*3/uL (ref 4.0–10.5)

## 2015-03-08 MED ORDER — SENNOSIDES-DOCUSATE SODIUM 8.6-50 MG PO TABS
2.0000 | ORAL_TABLET | Freq: Once | ORAL | Status: AC
  Administered 2015-03-08: 2 via ORAL
  Filled 2015-03-08: qty 2

## 2015-03-08 MED ORDER — LISINOPRIL 2.5 MG PO TABS
2.5000 mg | ORAL_TABLET | Freq: Every day | ORAL | Status: DC
  Administered 2015-03-09: 2.5 mg via ORAL
  Filled 2015-03-08 (×2): qty 1

## 2015-03-08 MED ORDER — OFF THE BEAT BOOK
Freq: Once | Status: AC
  Administered 2015-03-08: 17:00:00
  Filled 2015-03-08: qty 1

## 2015-03-08 MED ORDER — APIXABAN 5 MG PO TABS
5.0000 mg | ORAL_TABLET | Freq: Two times a day (BID) | ORAL | Status: DC
  Administered 2015-03-08 – 2015-03-09 (×3): 5 mg via ORAL
  Filled 2015-03-08 (×3): qty 1

## 2015-03-08 MED ORDER — CARVEDILOL 3.125 MG PO TABS
3.1250 mg | ORAL_TABLET | Freq: Two times a day (BID) | ORAL | Status: DC
  Administered 2015-03-08 – 2015-03-09 (×2): 3.125 mg via ORAL
  Filled 2015-03-08 (×2): qty 1

## 2015-03-08 NOTE — Progress Notes (Signed)
Patient complaining of constipation.  Patient stated he would like to try something oral to help relieve his constipation.  RN text paged this information to [facility].

## 2015-03-08 NOTE — Progress Notes (Signed)
RN into administer evening medications and patient stated he needed to throw up.  RN offered PRN Zofran.  Patient asked if that medicine would keep him from throwing up and RN stated it should.  Patient refused the Zofran stating he wanted to throw up stating if he did then he would feel better.  RN instructed patient if that's what he wanted to do and if he was able to throw up to please not flush the toilet so RN could assess vomit.  Patient stated understanding and RN left the room.  RN returned a bit later and patient stated it was a false alarm and he did not throw up.  RN asked patient if he wanted to try the Zofran at this time and patient continued to refuse Zofran.  RN asked patient if he wanted to try some graham crackers or regular crackers to snack on with his evening medications because one of them was his amiodarone and that medication can cause patient nausea/vomiting and patient replied no he would take the pills by themselves.  Patient stated if he ate anything at this time that would just make the nausea worse.  Patient then took evening medications.  RN instructed patient that if he did get sick to please call for RN and do not flush the toilet.  Patient stated understanding.
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
## 2015-03-08 NOTE — Care Management Note (Addendum)
Case Management Note  Patient Details  Name: Dale [name] MRN: [id number] Date of Birth: 1946-02-09  Subjective/Objective:    Pt admitted for A Fib RVR-Plan to d/c home on Eliquis.             Action/Plan: CM did provide pt with 30 day free card. Benefits check in process and will make pt aware of cost once completed. CM did call [facility] in [place] and medication is available. No further needs from CM at this time.    Expected Discharge Date:  03/08/15               Expected Discharge Plan:  Acute to Acute Transfer  In-House Referral:  NA  Discharge planning Services  CM Consult  Post Acute Care Choice:  NA Choice offered to:  NA  DME Arranged:  N/A DME Agency:  NA  HH Arranged:  NA HH Agency:  NA  Status of Service:  Completed, signed off  Medicare Important Message Given:    Date Medicare IM Given:    Medicare IM give by:    Date Additional Medicare IM Given:    Additional Medicare Important Message give by:     If discussed at Long Length of Stay Meetings, dates discussed:    Additional Comments: 1200 03-08-15 Tomi Bamberger, RN,BSN [phone] Pt copay will be $47- prior auth is not required  Gala Lewandowsky, RN 03/08/2015, 9:49 AM

## 2015-03-08 NOTE — Progress Notes (Signed)
Patient had a 12 beat run of Vtach.  RN into check on patient and patient was asymptomatic.  No BMP noted to be ordered to check with AM labs in the morning.  Cardiology text paged with all information in this note.

## 2015-03-09 ENCOUNTER — Encounter (HOSPITAL_COMMUNITY): Payer: Self-pay | Admitting: Student

## 2015-03-09 DIAGNOSIS — I4891 Unspecified atrial fibrillation: Secondary | ICD-10-CM | POA: Diagnosis not present

## 2015-03-09 LAB — BASIC METABOLIC PANEL
Anion gap: 9 (ref 5–15)
BUN: 24 mg/dL — AB (ref 6–20)
CALCIUM: 8.9 mg/dL (ref 8.9–10.3)
CO2: 26 mmol/L (ref 22–32)
CREATININE: 1.19 mg/dL (ref 0.61–1.24)
Chloride: 100 mmol/L — ABNORMAL LOW (ref 101–111)
GFR calc non Af Amer: >60 mL/min (ref >60)
Glucose, Bld: 124 mg/dL — ABNORMAL HIGH (ref 65–99)
Potassium: 4.5 mmol/L (ref 3.5–5.1)
SODIUM: 135 mmol/L (ref 135–145)

## 2015-03-09 LAB — CBC
HCT: 36.1 % — ABNORMAL LOW (ref 39.0–52.0)
Hemoglobin: 11.4 g/dL — ABNORMAL LOW (ref 13.0–17.0)
MCH: 28.6 pg (ref 26.0–34.0)
MCHC: 31.6 g/dL (ref 30.0–36.0)
MCV: 90.7 fL (ref 78.0–100.0)
Platelets: 145 10*3/uL — ABNORMAL LOW (ref 150–400)
RBC: 3.98 MIL/uL — ABNORMAL LOW (ref 4.22–5.81)
RDW: 13.8 % (ref 11.5–15.5)
WBC: 5.2 10*3/uL (ref 4.0–10.5)

## 2015-03-09 LAB — MAGNESIUM: MAGNESIUM: 1.9 mg/dL (ref 1.7–2.4)

## 2015-03-09 MED ORDER — APIXABAN 5 MG PO TABS: 5.0000 mg | ORAL_TABLET | Freq: Two times a day (BID) | ORAL | Status: DC

## 2015-03-09 MED ORDER — AMIODARONE HCL 200 MG PO TABS: ORAL_TABLET | ORAL | Status: DC

## 2015-03-09 MED ORDER — CARVEDILOL 3.125 MG PO TABS: 3.1250 mg | ORAL_TABLET | Freq: Two times a day (BID) | ORAL | Status: DC

## 2015-03-09 MED ORDER — LISINOPRIL 2.5 MG PO TABS: 2.5000 mg | ORAL_TABLET | Freq: Every day | ORAL | Status: DC

## 2015-03-09 NOTE — Progress Notes (Signed)
Patient discharge instructions and medications reviewed in detail with patient. Patient's questions answered to patients satisfaction. Patient discharged to home with wife by wheelchair.
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
## 2015-03-09 NOTE — Discharge Instructions (Addendum)
Information on my medicine - ELIQUIS (apixaban)  This medication education was reviewed with me or my healthcare representative as part of my discharge preparation.  The pharmacist that spoke with me during my hospital stay was:  Arman Filter, [facility]  Why was Eliquis prescribed for you? Eliquis was prescribed for you to reduce the risk of a blood clot forming that can cause a stroke if you have a medical condition called atrial fibrillation (a type of irregular heartbeat).  What do You need to know about Eliquis ? Take your Eliquis TWICE DAILY - one tablet in the morning and one tablet in the evening with or without food. If you have difficulty swallowing the tablet whole please discuss with your pharmacist how to take the medication safely.  Take Eliquis exactly as prescribed by your doctor and DO NOT stop taking Eliquis without talking to the doctor who prescribed the medication.  Stopping may increase your risk of developing a stroke.  Refill your prescription before you run out.  After discharge, you should have regular check-up appointments with your healthcare provider that is prescribing your Eliquis.  In the future your dose may need to be changed if your kidney function or weight changes by a significant amount or as you get older.  What do you do if you miss a dose? If you miss a dose, take it as soon as you remember on the same day and resume taking twice daily.  Do not take more than one dose of ELIQUIS at the same time to make up a missed dose.  Important Safety Information A possible side effect of Eliquis is bleeding. You should call your healthcare provider right away if you experience any of the following: ? Bleeding from an injury or your nose that does not stop. ? Unusual colored urine (red or dark brown) or unusual colored stools (red or black). ? Unusual bruising for unknown reasons. ? A serious fall or if you hit your head (even if there is no  bleeding).  Some medicines may interact with Eliquis and might increase your risk of bleeding or clotting while on Eliquis. To help avoid this, consult your healthcare provider or pharmacist prior to using any new prescription or non-prescription medications, including herbals, vitamins, non-steroidal anti-inflammatory drugs (NSAIDs) and supplements.  This website has more information on Eliquis (apixaban): http://www.eliquis.com/eliquis/home      PLEASE REMEMBER TO BRING ALL OF YOUR MEDICATIONS TO EACH OF YOUR FOLLOW-UP OFFICE VISITS.  PLEASE ATTEND ALL SCHEDULED FOLLOW-UP APPOINTMENTS.   Activity: Increase activity slowly as tolerated. You may shower, but no soaking baths (or swimming) for 1 week. No driving for 24 hours. No lifting over 5 lbs for 1 week. No sexual activity for 1 week.   You May Return to Work: in 1 week (if applicable)  Wound Care: You may wash cath site gently with soap and water. Keep cath site clean and dry. If you notice pain, swelling, bleeding or pus at your cath site, please call [phone]. Atrial Fibrillation Atrial fibrillation is a type of irregular or rapid heartbeat (arrhythmia). In atrial fibrillation, the heart quivers continuously in a chaotic pattern. This occurs when parts of the heart receive disorganized signals that make the heart unable to pump blood normally. This can increase the risk for stroke, heart failure, and other heart-related conditions. There are different types of atrial fibrillation, including:  Paroxysmal atrial fibrillation. This type starts suddenly, and it usually stops on its own shortly after it starts.  Persistent atrial fibrillation. This type often lasts longer than a week. It may stop on its own or with treatment.  Long-lasting persistent atrial fibrillation. This type lasts longer than 12 months.  Permanent atrial fibrillation. This type does not go away. Talk with your health care provider to learn about the type of  atrial fibrillation that you have. CAUSES This condition is caused by some heart-related conditions or procedures, including:  A heart attack.  Coronary artery disease.  Heart failure.  Heart valve conditions.  High blood pressure.  Inflammation of the sac that surrounds the heart (pericarditis).  Heart surgery.  Certain heart rhythm disorders, such as Wolf-Parkinson-White syndrome. Other causes include:  Pneumonia.  Obstructive sleep apnea.  Blockage of an artery in the lungs (pulmonary embolism, or PE).  Lung cancer.  Chronic lung disease.  Thyroid problems, especially if the thyroid is overactive (hyperthyroidism).  Caffeine.  Excessive alcohol use or illegal drug use.  Use of some medicines, including certain decongestants and diet pills. Sometimes, the cause cannot be found. RISK FACTORS This condition is more likely to develop in:  People who are older in age.  People who smoke.  People who have diabetes mellitus.  People who are overweight (obese).  Athletes who exercise vigorously. SYMPTOMS Symptoms of this condition include:  A feeling that your heart is beating rapidly or irregularly.  A feeling of discomfort or pain in your chest.  Shortness of breath.  Sudden light-headedness or weakness.  Getting tired easily during exercise. In some cases, there are no symptoms. DIAGNOSIS Your health care provider may be able to detect atrial fibrillation when taking your pulse. If detected, this condition may be diagnosed with:  An electrocardiogram (ECG).  A Holter monitor test that records your heartbeat patterns over a 24-hour period.  Transthoracic echocardiogram (TTE) to evaluate how blood flows through your heart.  Transesophageal echocardiogram (TEE) to view more detailed images of your heart.  A stress test.  Imaging tests, such as a CT scan or chest X-ray.  Blood tests. TREATMENT The main goals of treatment are to prevent blood  clots from forming and to keep your heart beating at a normal rate and rhythm. The type of treatment that you receive depends on many factors, such as your underlying medical conditions and how you feel when you are experiencing atrial fibrillation. This condition may be treated with:  Medicine to slow down the heart rate, bring the heart's rhythm back to normal, or prevent clots from forming.  Electrical cardioversion. This is a procedure that resets your heart's rhythm by delivering a controlled, low-energy shock to the heart through your skin.  Different types of ablation, such as catheter ablation, catheter ablation with pacemaker, or surgical ablation. These procedures destroy the heart tissues that send abnormal signals. When the pacemaker is used, it is placed under your skin to help your heart beat in a regular rhythm. HOME CARE INSTRUCTIONS  Take over-the counter and prescription medicines only as told by your health care provider.  If your health care provider prescribed a blood-thinning medicine (anticoagulant), take it exactly as told. Taking too much blood-thinning medicine can cause bleeding. If you do not take enough blood-thinning medicine, you will not have the protection that you need against stroke and other problems.  Do not use tobacco products, including cigarettes, chewing tobacco, and e-cigarettes. If you need help quitting, ask your health care provider.  If you have obstructive sleep apnea, manage your condition as told by your health care  provider.  Do not drink alcohol.  Do not drink beverages that contain caffeine, such as coffee, soda, and tea.  Maintain a healthy weight. Do not use diet pills unless your health care provider approves. Diet pills may make heart problems worse.  Follow diet instructions as told by your health care provider.  Exercise regularly as told by your health care provider.  Keep all follow-up visits as told by your health care  provider. This is important. PREVENTION  Avoid drinking beverages that contain caffeine or alcohol.  Avoid certain medicines, especially medicines that are used for breathing problems.  Avoid certain herbs and herbal medicines, such as those that contain ephedra or ginseng.  Do not use illegal drugs, such as cocaine and amphetamines.  Do not smoke.  Manage your high blood pressure. SEEK MEDICAL CARE IF:  You notice a change in the rate, rhythm, or strength of your heartbeat.  You are taking an anticoagulant and you notice increased bruising.  You tire more easily when you exercise or exert yourself. SEEK IMMEDIATE MEDICAL CARE IF:  You have chest pain, abdominal pain, sweating, or weakness.  You feel nauseous.  You notice blood in your vomit, bowel movement, or urine.  You have shortness of breath.  You suddenly have swollen feet and ankles.  You feel dizzy.  You have sudden weakness or numbness of the face, arm, or leg, especially on one side of the body.  You have trouble speaking, trouble understanding, or both (aphasia).  Your face or your eyelid droops on one side. These symptoms may represent a serious problem that is an emergency. Do not wait to see if the symptoms will go away. Get medical help right away. Call your local emergency services (911 in the [place] Do not drive yourself to the hospital.   This information is not intended to replace advice given to you by your health care provider. Make sure you discuss any questions you have with your health care provider.   Document Released: 05/21/2005 Document Revised: 02/09/2015 Document Reviewed: 09/15/2014 Else[employer] Interactive Patient Education [employer]
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
## 2015-03-19 ENCOUNTER — Emergency Department (HOSPITAL_COMMUNITY)
Admission: EM | Admit: 2015-03-19 | Discharge: 2015-03-19 | Disposition: A | Discharge status: Home / Self Care | Payer: Medicare HMO | Attending: Emergency Medicine | Admitting: Emergency Medicine

## 2015-03-19 ENCOUNTER — Emergency Department (HOSPITAL_COMMUNITY): Payer: Medicare HMO

## 2015-03-19 ENCOUNTER — Encounter (HOSPITAL_COMMUNITY): Payer: Self-pay | Admitting: Emergency Medicine

## 2015-03-19 DIAGNOSIS — R0600 Dyspnea, unspecified: Secondary | ICD-10-CM | POA: Diagnosis present

## 2015-03-19 DIAGNOSIS — Z9889 Other specified postprocedural states: Secondary | ICD-10-CM | POA: Diagnosis not present

## 2015-03-19 DIAGNOSIS — I4891 Unspecified atrial fibrillation: Secondary | ICD-10-CM | POA: Diagnosis not present

## 2015-03-19 DIAGNOSIS — Z7951 Long term (current) use of inhaled steroids: Secondary | ICD-10-CM | POA: Insufficient documentation

## 2015-03-19 DIAGNOSIS — M1990 Unspecified osteoarthritis, unspecified site: Secondary | ICD-10-CM | POA: Insufficient documentation

## 2015-03-19 DIAGNOSIS — R0602 Shortness of breath: Secondary | ICD-10-CM | POA: Diagnosis not present

## 2015-03-19 DIAGNOSIS — I5043 Acute on chronic combined systolic (congestive) and diastolic (congestive) heart failure: Secondary | ICD-10-CM | POA: Diagnosis not present

## 2015-03-19 DIAGNOSIS — Z87828 Personal history of other (healed) physical injury and trauma: Secondary | ICD-10-CM | POA: Diagnosis not present

## 2015-03-19 DIAGNOSIS — R109 Unspecified abdominal pain: Secondary | ICD-10-CM | POA: Diagnosis not present

## 2015-03-19 DIAGNOSIS — Z7901 Long term (current) use of anticoagulants: Secondary | ICD-10-CM | POA: Diagnosis not present

## 2015-03-19 DIAGNOSIS — I10 Essential (primary) hypertension: Secondary | ICD-10-CM | POA: Insufficient documentation

## 2015-03-19 DIAGNOSIS — Z79899 Other long term (current) drug therapy: Secondary | ICD-10-CM | POA: Insufficient documentation

## 2015-03-19 DIAGNOSIS — R630 Anorexia: Secondary | ICD-10-CM | POA: Diagnosis not present

## 2015-03-19 DIAGNOSIS — K5900 Constipation, unspecified: Secondary | ICD-10-CM | POA: Insufficient documentation

## 2015-03-19 HISTORY — DX: Other specified postprocedural states: Z98.890

## 2015-03-19 LAB — COMPREHENSIVE METABOLIC PANEL
ALK PHOS: 85 U/L (ref 38–126)
ALT: 21 U/L (ref 17–63)
ANION GAP: 7 (ref 5–15)
AST: 28 U/L (ref 15–41)
Albumin: 3.7 g/dL (ref 3.5–5.0)
BILIRUBIN TOTAL: 1.3 mg/dL — AB (ref 0.3–1.2)
BUN: 21 mg/dL — AB (ref 6–20)
CALCIUM: 8.8 mg/dL — AB (ref 8.9–10.3)
CO2: 24 mmol/L (ref 22–32)
Chloride: 106 mmol/L (ref 101–111)
Creatinine, Ser: 1.24 mg/dL (ref 0.61–1.24)
GFR calc Af Amer: >60 mL/min (ref >60)
GFR, EST NON AFRICAN AMERICAN: 58 mL/min — AB (ref >60)
Glucose, Bld: 92 mg/dL (ref 65–99)
POTASSIUM: 4.4 mmol/L (ref 3.5–5.1)
Sodium: 137 mmol/L (ref 135–145)
TOTAL PROTEIN: 6.9 g/dL (ref 6.5–8.1)

## 2015-03-19 LAB — CBC WITH DIFFERENTIAL/PLATELET
Basophils Absolute: 0 10*3/uL (ref 0.0–0.1)
Basophils Relative: 0 %
Eosinophils Absolute: 0.1 10*3/uL (ref 0.0–0.7)
Eosinophils Relative: 2 %
HEMATOCRIT: 31.9 % — AB (ref 39.0–52.0)
HEMOGLOBIN: 10.2 g/dL — AB (ref 13.0–17.0)
LYMPHS PCT: 37 %
Lymphs Abs: 2.1 10*3/uL (ref 0.7–4.0)
MCH: 28.1 pg (ref 26.0–34.0)
MCHC: 32 g/dL (ref 30.0–36.0)
MCV: 87.9 fL (ref 78.0–100.0)
MONO ABS: 0.5 10*3/uL (ref 0.1–1.0)
MONOS PCT: 10 %
NEUTROS ABS: 2.9 10*3/uL (ref 1.7–7.7)
NEUTROS PCT: 51 %
Platelets: 174 10*3/uL (ref 150–400)
RBC: 3.63 MIL/uL — ABNORMAL LOW (ref 4.22–5.81)
RDW: 13.8 % (ref 11.5–15.5)
WBC: 5.6 10*3/uL (ref 4.0–10.5)

## 2015-03-19 LAB — BRAIN NATRIURETIC PEPTIDE: B NATRIURETIC PEPTIDE 5: 1285 pg/mL — AB (ref 0.0–100.0)

## 2015-03-19 LAB — TROPONIN I: Troponin I: <0.03 ng/mL (ref <0.031)

## 2015-03-19 MED ORDER — FUROSEMIDE 10 MG/ML IJ SOLN
40.0000 mg | Freq: Once | INTRAMUSCULAR | Status: AC
  Administered 2015-03-19: 40 mg via INTRAVENOUS
  Filled 2015-03-19: qty 4

## 2015-03-19 MED ORDER — SENNOSIDES-DOCUSATE SODIUM 8.6-50 MG PO TABS: 2.0000 | ORAL_TABLET | Freq: Every day | ORAL | Status: DC

## 2015-03-19 MED ORDER — FUROSEMIDE 20 MG PO TABS: 40.0000 mg | ORAL_TABLET | Freq: Every day | ORAL | Status: DC

## 2015-03-19 NOTE — ED Notes (Signed)
PT c/o increasing SOB with exertion with non-productive cough x3 days with tightness feeling in chest. PT states recent admission to hospital for Afib.

## 2015-03-19 NOTE — Discharge Instructions (Signed)
As discussed, it is important that you follow up Wednesday with your physician for continued management of your condition.  If you develop any new, or concerning changes in your condition, please return to the emergency department immediately.

## 2015-03-19 NOTE — ED Notes (Signed)
Discharge papers reviewed -- new script for constipation obtained from MD as well as lasixs - instructed on use of both . Pt and wife verbalized understanding. Ambulated off unit
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
## 2015-03-23 ENCOUNTER — Encounter: Payer: Self-pay | Admitting: Physician Assistant

## 2015-03-23 ENCOUNTER — Encounter: Payer: Self-pay | Admitting: *Deleted

## 2015-03-23 ENCOUNTER — Ambulatory Visit (INDEPENDENT_AMBULATORY_CARE_PROVIDER_SITE_OTHER): Payer: Medicare HMO | Admitting: Physician Assistant

## 2015-03-23 VITALS — BP 100/60 | HR 69 | Ht 68.0 in | Wt 145.2 lb

## 2015-03-23 DIAGNOSIS — I5043 Acute on chronic combined systolic (congestive) and diastolic (congestive) heart failure: Secondary | ICD-10-CM

## 2015-03-23 DIAGNOSIS — I4891 Unspecified atrial fibrillation: Secondary | ICD-10-CM

## 2015-03-23 DIAGNOSIS — I5042 Chronic combined systolic (congestive) and diastolic (congestive) heart failure: Secondary | ICD-10-CM

## 2015-03-23 DIAGNOSIS — I480 Paroxysmal atrial fibrillation: Secondary | ICD-10-CM

## 2015-03-23 DIAGNOSIS — I429 Cardiomyopathy, unspecified: Secondary | ICD-10-CM

## 2015-03-23 DIAGNOSIS — I428 Other cardiomyopathies: Secondary | ICD-10-CM | POA: Insufficient documentation

## 2015-03-23 NOTE — Assessment & Plan Note (Signed)
EF 15%, normal coronary arteries. We'll repeat echo in 3 months. If his EF is still down he may need a defibrillator. Follow-up with Dr. Kirtland Bouchard in 6 weeks.

## 2015-03-23 NOTE — Assessment & Plan Note (Signed)
Patient converted to normal sinus rhythm on amiodarone 200 mg once daily and low-dose Coreg.

## 2015-03-23 NOTE — Patient Instructions (Addendum)
Your physician recommends that you schedule a follow-up appointment in: 6 Weeks with Dr. Purvis Sheffield  Your physician recommends that you weigh, daily, at the same time every day, and in the same amount of clothing. Please record your daily weights on the handout provided and bring it to your next appointment. Please call if you have a weight gain of 2 or more pounds over night.   You have been given a copy of a Low Sodium Diet  You have been a note for work today.   Your physician has requested that you have an echocardiogram in 3 Months. Echocardiography is a painless test that uses sound waves to create images of your heart. It provides your doctor with information about the size and shape of your heart and how well your heart's chambers and valves are working. This procedure takes approximately one hour. There are no restrictions for this procedure.  If you need a refill on your cardiac medications before your next appointment, please call your pharmacy.  Thank you for choosing [facility]!
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
## 2015-03-23 NOTE — Assessment & Plan Note (Signed)
Patient's heart failure is compensated. His blood pressure is quite low. He has chronic dyspnea on exertion due to his nonischemic cardiomyopathy EF of 15%. I can't titrate his Coreg up today because of his low blood pressure. Continue low-dose Lasix. 2 g sodium diet. Will not repeat labs today because he just had a mini emergency room 3 days ago. Renal function was stable. Will give a note for him to stay out of work at this time.

## 2015-04-01 ENCOUNTER — Other Ambulatory Visit: Payer: Self-pay | Admitting: *Deleted

## 2015-04-04 MED ORDER — FUROSEMIDE 20 MG PO TABS: 40.0000 mg | ORAL_TABLET | Freq: Every day | ORAL | Status: DC

## 2015-04-05 NOTE — Telephone Encounter (Signed)
Yes, you may refill his Lasix. Same dose

## 2015-04-07 ENCOUNTER — Other Ambulatory Visit: Payer: Self-pay | Admitting: Physician Assistant

## 2015-04-07 NOTE — Telephone Encounter (Signed)
Pt calling stating that he is having problems getting his furosemide 20 mg prescription filled. Pt was in the ED on 03/19/15 and his furosemide 20 mg stated to take 1 tablet by mouth daily. Herma Carson, PA saw the pt on 03/23/15 and it was ordered as furosemide 20 mg taking 2 tablet by mouth daily dispensing 14 tablets. Please clarify how pt is suppose to be taking this medication and how much is suppose to be dispense. Please advise

## 2015-04-11 MED ORDER — FUROSEMIDE 20 MG PO TABS: 40.0000 mg | ORAL_TABLET | Freq: Every day | ORAL | Status: DC

## 2015-04-11 NOTE — Telephone Encounter (Signed)
Patient was on Lasix 20 mg 2 a day. Please verify what he is taking. May give a script for 30 or 90 day supply whichever he prefers

## 2015-04-11 NOTE — Telephone Encounter (Signed)
Called pt and left message asking pt to give our office a call back to clarify how the pt is taking the medication furosemide 20 mg, is pt taking 1 tablet daily or 2 tablets daily.

## 2015-04-11 NOTE — Telephone Encounter (Signed)
Pt called back to clarify that he is taking Lasix 20 mg twice a day. Pt's Rx was sent to his pharmacy

## 2015-04-18 ENCOUNTER — Telehealth: Payer: Self-pay | Admitting: Physician Assistant

## 2015-04-18 NOTE — Telephone Encounter (Signed)
Pt has had low BP all morning w/ readings at 9:30 93/54 and at 12:30 99/54

## 2015-04-18 NOTE — Telephone Encounter (Signed)
Called patient.  No answer.

## 2015-04-19 ENCOUNTER — Telehealth: Payer: Self-pay

## 2015-04-19 NOTE — Telephone Encounter (Signed)
If he is not dizzy/lightheaded, would not change meds. His heart function is very weak and he is on a very low dose of medications to help strengthen heart function.

## 2015-04-19 NOTE — Telephone Encounter (Signed)
Spoke to pt, and he was a little concerned about his blood pressures. He was thinking that 99/64 was a little too low. He stated that he has been having the readings that low for the past couple of days, but does not have any problems with it.

## 2015-04-19 NOTE — Telephone Encounter (Signed)
Pt has had low BP all morning w/ readings at 9:30 93/54 and at 12:30 99/54. ( THIS WAS THE PHONE NOTE FROM YESTERDAY. NOT SURE WHY IT DID NOT COPY INTO THE 1 THAT I SENT.Marland Kitchen)

## 2015-04-19 NOTE — Telephone Encounter (Signed)
Pt denies any sx's ,will continue coreg as directed

## 2015-04-27 ENCOUNTER — Ambulatory Visit (INDEPENDENT_AMBULATORY_CARE_PROVIDER_SITE_OTHER): Payer: Medicare HMO | Admitting: Cardiovascular Disease

## 2015-04-27 VITALS — BP 88/50 | HR 80 | Ht 68.0 in | Wt 142.6 lb

## 2015-04-27 DIAGNOSIS — Z79899 Other long term (current) drug therapy: Secondary | ICD-10-CM | POA: Diagnosis not present

## 2015-04-27 DIAGNOSIS — I519 Heart disease, unspecified: Secondary | ICD-10-CM

## 2015-04-27 DIAGNOSIS — R531 Weakness: Secondary | ICD-10-CM

## 2015-04-27 DIAGNOSIS — I9589 Other hypotension: Secondary | ICD-10-CM

## 2015-04-27 DIAGNOSIS — I4891 Unspecified atrial fibrillation: Secondary | ICD-10-CM

## 2015-04-27 DIAGNOSIS — I428 Other cardiomyopathies: Secondary | ICD-10-CM

## 2015-04-27 DIAGNOSIS — I429 Cardiomyopathy, unspecified: Secondary | ICD-10-CM

## 2015-04-27 DIAGNOSIS — I5042 Chronic combined systolic (congestive) and diastolic (congestive) heart failure: Secondary | ICD-10-CM | POA: Diagnosis not present

## 2015-04-27 MED ORDER — METOPROLOL SUCCINATE ER 25 MG PO TB24: 12.5000 mg | ORAL_TABLET | Freq: Every day | ORAL | Status: DC

## 2015-04-27 NOTE — Patient Instructions (Addendum)
Your physician recommends that you schedule a follow-up appointment in: 3 months with Dr Reggy Eye have been referred to [facility] Failure Clinic in [place]    STOP Lisinopril  STOP Coreg   START Toprol XL 12.5 mg daily    Your physician recommends that you return for lab work in: TSH and LFT's next week     If you need a refill on your cardiac medications before your next appointment, please call your pharmacy.      Thank you for choosing [facility] !
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
## 2015-05-11 ENCOUNTER — Encounter: Payer: Self-pay | Admitting: Cardiovascular Disease

## 2015-05-25 ENCOUNTER — Ambulatory Visit (HOSPITAL_COMMUNITY)
Admission: RE | Admit: 2015-05-25 | Discharge: 2015-05-25 | Disposition: A | Discharge status: Home / Self Care | Payer: Medicare HMO | Source: Ambulatory Visit | Attending: Internal Medicine | Admitting: Internal Medicine

## 2015-05-25 VITALS — BP 112/69 | HR 71 | Wt 149.4 lb

## 2015-05-25 DIAGNOSIS — I4891 Unspecified atrial fibrillation: Secondary | ICD-10-CM | POA: Diagnosis not present

## 2015-05-25 DIAGNOSIS — I429 Cardiomyopathy, unspecified: Secondary | ICD-10-CM

## 2015-05-25 DIAGNOSIS — I480 Paroxysmal atrial fibrillation: Secondary | ICD-10-CM | POA: Diagnosis not present

## 2015-05-25 DIAGNOSIS — I428 Other cardiomyopathies: Secondary | ICD-10-CM

## 2015-05-25 DIAGNOSIS — I5042 Chronic combined systolic (congestive) and diastolic (congestive) heart failure: Secondary | ICD-10-CM | POA: Diagnosis present

## 2015-05-25 MED ORDER — FUROSEMIDE 40 MG PO TABS: 40.0000 mg | ORAL_TABLET | Freq: Every day | ORAL | Status: DC

## 2015-05-25 MED ORDER — SPIRONOLACTONE 25 MG PO TABS: 12.5000 mg | ORAL_TABLET | Freq: Every day | ORAL | Status: DC

## 2015-05-25 MED ORDER — DIGOXIN 125 MCG PO TABS: 0.1250 mg | ORAL_TABLET | Freq: Every day | ORAL | Status: DC
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
## 2015-05-25 NOTE — Patient Instructions (Addendum)
Start Spironolactone 12.5 mg (1/2 tab) daily  Start Digoxin 0.125 mg daily  Change Furosemide (Lasix) to 40 mg daily, we have sent you in a new prescription for 40 mg tablets  Labs today  Your physician has requested that you have a cardiac MRI. Cardiac MRI uses a computer to create images of your heart as its beating, producing both still and moving pictures of your heart and major blood vessels. For further information please visit [email or web address]. Please follow the instruction sheet given to you today for more information.  Your physician recommends that you schedule a follow-up appointment in: 2-3 weeks

## 2015-05-25 NOTE — Addendum Note (Signed)
Encounter addended by: Chyrl Civatte, RN on: 05/25/2015  1:05 PM<BR>     Documentation filed: Visit Diagnoses, Dx Association, Patient Instructions Section, Orders

## 2015-05-25 NOTE — Progress Notes (Signed)
Advanced Heart Failure Medication Review by a Pharmacist  Does the patient  feel that his/her medications are working for him/her?  yes  Has the patient been experiencing any side effects to the medications prescribed?  no  Does the patient measure his/her own blood pressure or blood glucose at home?  no   Does the patient have any problems obtaining medications due to transportation or finances?   no  Understanding of regimen: good Understanding of indications: good Potential of compliance: good Patient understands to avoid NSAIDs. Patient understands to avoid decongestants.  Issues to address at subsequent visits: None   Pharmacist comments:  Dale [name] is pleasant 69 yo M presenting with his wife and a current medication list. He reports excellent compliance with his regimen and did not have any specific medication-related questions or concerns for me at this time.   Dale [name]. Bonnye Fava, PharmD, BCPS, CPP Clinical Pharmacist Pager: [phone] Phone: [phone] 05/25/2015 12:27 PM      Time with patient: 8 minutes Preparation and documentation time: 2 minutes Total time: 10 minutes

## 2015-05-26 LAB — PROTEIN ELECTRO, RANDOM URINE
Albumin ELP, Urine: 100 %
Alpha-1-Globulin, U: 0 %
Alpha-2-Globulin, U: 0 %
Beta Globulin, U: 0 %
GAMMA GLOBULIN, U: 0 %
Total Protein, Urine: 5.2 mg/dL

## 2015-06-05 DIAGNOSIS — R768 Other specified abnormal immunological findings in serum: Secondary | ICD-10-CM

## 2015-06-05 HISTORY — DX: Other specified abnormal immunological findings in serum: R76.8

## 2015-06-08 ENCOUNTER — Telehealth (HOSPITAL_COMMUNITY): Payer: Self-pay | Admitting: *Deleted

## 2015-06-08 NOTE — Telephone Encounter (Signed)
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  [employer] had faxed in a release of info for pt on 06/02/15 for dates 05/05/15-07/02/15 our only records during that time frame where OV note from 12/21 that was faxed back to Candis Musa at [phone], pt aware

## 2015-06-09 ENCOUNTER — Encounter (HOSPITAL_COMMUNITY): Payer: Self-pay | Admitting: Internal Medicine

## 2015-06-09 ENCOUNTER — Ambulatory Visit (HOSPITAL_COMMUNITY)
Admission: RE | Admit: 2015-06-09 | Discharge: 2015-06-09 | Disposition: A | Discharge status: Home / Self Care | Payer: Medicare HMO | Source: Ambulatory Visit | Attending: Internal Medicine | Admitting: Internal Medicine

## 2015-06-09 VITALS — BP 98/56 | HR 62 | Wt 151.2 lb

## 2015-06-09 DIAGNOSIS — I5042 Chronic combined systolic (congestive) and diastolic (congestive) heart failure: Secondary | ICD-10-CM

## 2015-06-09 DIAGNOSIS — B1920 Unspecified viral hepatitis C without hepatic coma: Secondary | ICD-10-CM | POA: Diagnosis not present

## 2015-06-09 DIAGNOSIS — I5043 Acute on chronic combined systolic (congestive) and diastolic (congestive) heart failure: Secondary | ICD-10-CM | POA: Insufficient documentation

## 2015-06-09 DIAGNOSIS — I428 Other cardiomyopathies: Secondary | ICD-10-CM | POA: Diagnosis present

## 2015-06-09 DIAGNOSIS — B182 Chronic viral hepatitis C: Secondary | ICD-10-CM

## 2015-06-09 DIAGNOSIS — I5023 Acute on chronic systolic (congestive) heart failure: Secondary | ICD-10-CM | POA: Diagnosis not present

## 2015-06-09 DIAGNOSIS — I429 Cardiomyopathy, unspecified: Secondary | ICD-10-CM

## 2015-06-09 DIAGNOSIS — I110 Hypertensive heart disease with heart failure: Secondary | ICD-10-CM | POA: Diagnosis not present

## 2015-06-09 DIAGNOSIS — Z7902 Long term (current) use of antithrombotics/antiplatelets: Secondary | ICD-10-CM | POA: Diagnosis not present

## 2015-06-09 DIAGNOSIS — Z79899 Other long term (current) drug therapy: Secondary | ICD-10-CM | POA: Diagnosis not present

## 2015-06-09 DIAGNOSIS — Z87891 Personal history of nicotine dependence: Secondary | ICD-10-CM | POA: Diagnosis not present

## 2015-06-09 DIAGNOSIS — I480 Paroxysmal atrial fibrillation: Secondary | ICD-10-CM | POA: Diagnosis not present

## 2015-06-09 LAB — BASIC METABOLIC PANEL
Anion gap: 5 (ref 5–15)
BUN: 15 mg/dL (ref 6–20)
CHLORIDE: 106 mmol/L (ref 101–111)
CO2: 27 mmol/L (ref 22–32)
CREATININE: 1.29 mg/dL — AB (ref 0.61–1.24)
Calcium: 9 mg/dL (ref 8.9–10.3)
GFR calc Af Amer: >60 mL/min (ref >60)
GFR calc non Af Amer: 55 mL/min — ABNORMAL LOW (ref >60)
GLUCOSE: 93 mg/dL (ref 65–99)
POTASSIUM: 4.3 mmol/L (ref 3.5–5.1)
SODIUM: 138 mmol/L (ref 135–145)
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
## 2015-06-09 NOTE — Patient Instructions (Signed)
Labs today  Your physician has recommended that you have a cardiopulmonary stress test (CPX). CPX testing is a non-invasive measurement of heart and lung function. It replaces a traditional treadmill stress test. This type of test provides a tremendous amount of information that relates not only to your present condition but also for future outcomes. This test combines measurements of you ventilation, respiratory gas exchange in the lungs, electrocardiogram (EKG), blood pressure and physical response before, during, and following an exercise protocol.  Your physician recommends that you schedule a follow-up appointment in: 2 weeks

## 2015-06-09 NOTE — Progress Notes (Signed)
REDS VEST READING= 23 CHEST RULER=13  VEST FITTING TASKS: POSTURE=standing HEIGHT MARKER=tall CENTER STRIP=align  COMMENTS:sensible approved

## 2015-06-10 DIAGNOSIS — I5023 Acute on chronic systolic (congestive) heart failure: Secondary | ICD-10-CM | POA: Insufficient documentation

## 2015-06-10 DIAGNOSIS — B182 Chronic viral hepatitis C: Secondary | ICD-10-CM | POA: Insufficient documentation

## 2015-06-10 LAB — HIV ANTIBODY (ROUTINE TESTING W REFLEX): HIV Screen 4th Generation wRfx: NONREACTIVE

## 2015-06-14 ENCOUNTER — Telehealth (HOSPITAL_COMMUNITY): Payer: Self-pay

## 2015-06-14 NOTE — Telephone Encounter (Signed)
Disability and Leave forms along with requested medical records faxed to [facility] at provided # [phone] Claim # [id number] Copy of request scanned into patient's electronic medical record.  Ave Filter

## 2015-06-15 ENCOUNTER — Telehealth (HOSPITAL_COMMUNITY): Payer: Self-pay | Admitting: Cardiology

## 2015-06-15 NOTE — Telephone Encounter (Signed)
Patients wife called requesting why patient was told to pack a bag for procedure advised dr. Gala Romney is expecting him to no perform well on cpx based on symptoms and finding at last office visit Patient may need RHC/admission for further answers and that can be done right after the CPX, provider wanted patient/family to be prepared in the event admission is needed  Patients wife aware and voiced understanding

## 2015-06-16 ENCOUNTER — Inpatient Hospital Stay (HOSPITAL_COMMUNITY)
Admission: AD | Admit: 2015-06-16 | Discharge: 2015-06-24 | DRG: 286 | Disposition: A | Discharge status: Home Health | Payer: Medicare HMO | Source: Ambulatory Visit | Attending: Internal Medicine | Admitting: Internal Medicine

## 2015-06-16 ENCOUNTER — Encounter (HOSPITAL_COMMUNITY): Payer: Medicare HMO

## 2015-06-16 ENCOUNTER — Other Ambulatory Visit (HOSPITAL_COMMUNITY): Payer: Self-pay | Admitting: Adult Health

## 2015-06-16 ENCOUNTER — Telehealth (HOSPITAL_COMMUNITY): Payer: Self-pay | Admitting: *Deleted

## 2015-06-16 DIAGNOSIS — Z515 Encounter for palliative care: Secondary | ICD-10-CM | POA: Diagnosis not present

## 2015-06-16 DIAGNOSIS — I509 Heart failure, unspecified: Secondary | ICD-10-CM | POA: Diagnosis not present

## 2015-06-16 DIAGNOSIS — Z23 Encounter for immunization: Secondary | ICD-10-CM | POA: Diagnosis not present

## 2015-06-16 DIAGNOSIS — R0600 Dyspnea, unspecified: Secondary | ICD-10-CM

## 2015-06-16 DIAGNOSIS — I5043 Acute on chronic combined systolic (congestive) and diastolic (congestive) heart failure: Secondary | ICD-10-CM | POA: Diagnosis not present

## 2015-06-16 DIAGNOSIS — N183 Chronic kidney disease, stage 3 (moderate): Secondary | ICD-10-CM | POA: Diagnosis present

## 2015-06-16 DIAGNOSIS — Z87891 Personal history of nicotine dependence: Secondary | ICD-10-CM

## 2015-06-16 DIAGNOSIS — Z7901 Long term (current) use of anticoagulants: Secondary | ICD-10-CM | POA: Diagnosis not present

## 2015-06-16 DIAGNOSIS — Z6823 Body mass index (BMI) 23.0-23.9, adult: Secondary | ICD-10-CM | POA: Diagnosis not present

## 2015-06-16 DIAGNOSIS — K31819 Angiodysplasia of stomach and duodenum without bleeding: Secondary | ICD-10-CM | POA: Diagnosis not present

## 2015-06-16 DIAGNOSIS — D649 Anemia, unspecified: Secondary | ICD-10-CM | POA: Diagnosis not present

## 2015-06-16 DIAGNOSIS — I480 Paroxysmal atrial fibrillation: Secondary | ICD-10-CM | POA: Diagnosis present

## 2015-06-16 DIAGNOSIS — K921 Melena: Secondary | ICD-10-CM | POA: Diagnosis not present

## 2015-06-16 DIAGNOSIS — Z8249 Family history of ischemic heart disease and other diseases of the circulatory system: Secondary | ICD-10-CM

## 2015-06-16 DIAGNOSIS — Q2733 Arteriovenous malformation of digestive system vessel: Secondary | ICD-10-CM

## 2015-06-16 DIAGNOSIS — N179 Acute kidney failure, unspecified: Secondary | ICD-10-CM | POA: Diagnosis present

## 2015-06-16 DIAGNOSIS — B1920 Unspecified viral hepatitis C without hepatic coma: Secondary | ICD-10-CM | POA: Diagnosis present

## 2015-06-16 DIAGNOSIS — Z79899 Other long term (current) drug therapy: Secondary | ICD-10-CM | POA: Diagnosis not present

## 2015-06-16 DIAGNOSIS — I130 Hypertensive heart and chronic kidney disease with heart failure and stage 1 through stage 4 chronic kidney disease, or unspecified chronic kidney disease: Secondary | ICD-10-CM | POA: Diagnosis present

## 2015-06-16 DIAGNOSIS — E440 Moderate protein-calorie malnutrition: Secondary | ICD-10-CM | POA: Diagnosis present

## 2015-06-16 DIAGNOSIS — G8324 Monoplegia of upper limb affecting left nondominant side: Secondary | ICD-10-CM | POA: Diagnosis present

## 2015-06-16 DIAGNOSIS — B182 Chronic viral hepatitis C: Secondary | ICD-10-CM | POA: Diagnosis not present

## 2015-06-16 DIAGNOSIS — D509 Iron deficiency anemia, unspecified: Secondary | ICD-10-CM | POA: Diagnosis present

## 2015-06-16 DIAGNOSIS — Z7189 Other specified counseling: Secondary | ICD-10-CM | POA: Insufficient documentation

## 2015-06-16 DIAGNOSIS — I429 Cardiomyopathy, unspecified: Secondary | ICD-10-CM | POA: Diagnosis present

## 2015-06-16 DIAGNOSIS — I5023 Acute on chronic systolic (congestive) heart failure: Secondary | ICD-10-CM | POA: Diagnosis not present

## 2015-06-16 DIAGNOSIS — Z91030 Bee allergy status: Secondary | ICD-10-CM | POA: Diagnosis not present

## 2015-06-16 DIAGNOSIS — R195 Other fecal abnormalities: Secondary | ICD-10-CM | POA: Diagnosis not present

## 2015-06-16 HISTORY — DX: Other specified abnormal immunological findings in serum: R76.8

## 2015-06-16 LAB — CBC
HCT: 25.2 % — ABNORMAL LOW (ref 39.0–52.0)
Hemoglobin: 7.1 g/dL — ABNORMAL LOW (ref 13.0–17.0)
MCH: 19.8 pg — ABNORMAL LOW (ref 26.0–34.0)
MCHC: 28.2 g/dL — AB (ref 30.0–36.0)
MCV: 70.2 fL — ABNORMAL LOW (ref 78.0–100.0)
PLATELETS: 193 10*3/uL (ref 150–400)
RBC: 3.59 MIL/uL — ABNORMAL LOW (ref 4.22–5.81)
RDW: 18.8 % — AB (ref 11.5–15.5)
WBC: 6.5 10*3/uL (ref 4.0–10.5)

## 2015-06-16 LAB — CBC WITH DIFFERENTIAL/PLATELET
BASOS ABS: 0 10*3/uL (ref 0.0–0.1)
Basophils Relative: 0 %
EOS ABS: 0.2 10*3/uL (ref 0.0–0.7)
EOS PCT: 3 %
HCT: 27 % — ABNORMAL LOW (ref 39.0–52.0)
Hemoglobin: 7.7 g/dL — ABNORMAL LOW (ref 13.0–17.0)
LYMPHS ABS: 2.3 10*3/uL (ref 0.7–4.0)
Lymphocytes Relative: 39 %
MCH: 20.1 pg — ABNORMAL LOW (ref 26.0–34.0)
MCHC: 28.5 g/dL — ABNORMAL LOW (ref 30.0–36.0)
MCV: 70.3 fL — AB (ref 78.0–100.0)
MONO ABS: 0.6 10*3/uL (ref 0.1–1.0)
Monocytes Relative: 11 %
NEUTROS PCT: 47 %
Neutro Abs: 2.8 10*3/uL (ref 1.7–7.7)
PLATELETS: 209 10*3/uL (ref 150–400)
RBC: 3.84 MIL/uL — AB (ref 4.22–5.81)
RDW: 18.7 % — AB (ref 11.5–15.5)
WBC: 5.9 10*3/uL (ref 4.0–10.5)

## 2015-06-16 LAB — COMPREHENSIVE METABOLIC PANEL
ALT: 20 U/L (ref 17–63)
AST: 34 U/L (ref 15–41)
Albumin: 3.2 g/dL — ABNORMAL LOW (ref 3.5–5.0)
Alkaline Phosphatase: 115 U/L (ref 38–126)
Anion gap: 9 (ref 5–15)
BUN: 17 mg/dL (ref 6–20)
CHLORIDE: 106 mmol/L (ref 101–111)
CO2: 26 mmol/L (ref 22–32)
Calcium: 8.6 mg/dL — ABNORMAL LOW (ref 8.9–10.3)
Creatinine, Ser: 1.34 mg/dL — ABNORMAL HIGH (ref 0.61–1.24)
GFR calc Af Amer: >60 mL/min (ref >60)
GFR, EST NON AFRICAN AMERICAN: 52 mL/min — AB (ref >60)
Glucose, Bld: 100 mg/dL — ABNORMAL HIGH (ref 65–99)
POTASSIUM: 4.2 mmol/L (ref 3.5–5.1)
SODIUM: 141 mmol/L (ref 135–145)
Total Bilirubin: 0.9 mg/dL (ref 0.3–1.2)
Total Protein: 6.6 g/dL (ref 6.5–8.1)

## 2015-06-16 LAB — DIFFERENTIAL
Basophils Absolute: 0.1 10*3/uL (ref 0.0–0.1)
Basophils Relative: 1 %
EOS PCT: 3 %
Eosinophils Absolute: 0.2 10*3/uL (ref 0.0–0.7)
LYMPHS PCT: 41 %
Lymphs Abs: 2.7 10*3/uL (ref 0.7–4.0)
MONOS PCT: 10 %
Monocytes Absolute: 0.7 10*3/uL (ref 0.1–1.0)
NEUTROS ABS: 2.8 10*3/uL (ref 1.7–7.7)
NEUTROS PCT: 45 %

## 2015-06-16 LAB — BASIC METABOLIC PANEL
Anion gap: 6 (ref 5–15)
BUN: 18 mg/dL (ref 6–20)
CALCIUM: 8.5 mg/dL — AB (ref 8.9–10.3)
CO2: 28 mmol/L (ref 22–32)
Chloride: 106 mmol/L (ref 101–111)
Creatinine, Ser: 1.45 mg/dL — ABNORMAL HIGH (ref 0.61–1.24)
GFR calc Af Amer: 55 mL/min — ABNORMAL LOW (ref >60)
GFR, EST NON AFRICAN AMERICAN: 47 mL/min — AB (ref >60)
GLUCOSE: 128 mg/dL — AB (ref 65–99)
Potassium: 3.9 mmol/L (ref 3.5–5.1)
SODIUM: 140 mmol/L (ref 135–145)

## 2015-06-16 LAB — PREPARE RBC (CROSSMATCH)

## 2015-06-16 LAB — IRON AND TIBC
Iron: 19 ug/dL — ABNORMAL LOW (ref 45–182)
SATURATION RATIOS: 4 % — AB (ref 17.9–39.5)
TIBC: 493 ug/dL — AB (ref 250–450)
UIBC: 474 ug/dL

## 2015-06-16 LAB — MRSA PCR SCREENING: MRSA by PCR: NEGATIVE

## 2015-06-16 LAB — FERRITIN: FERRITIN: 6 ng/mL — AB (ref 24–336)

## 2015-06-16 LAB — DIGOXIN LEVEL: Digoxin Level: 1 ng/mL (ref 0.8–2.0)

## 2015-06-16 LAB — PROTIME-INR
INR: 1.3 (ref 0.00–1.49)
PROTHROMBIN TIME: 16.3 s — AB (ref 11.6–15.2)

## 2015-06-16 LAB — TSH: TSH: 1.239 u[IU]/mL (ref 0.350–4.500)

## 2015-06-16 LAB — BRAIN NATRIURETIC PEPTIDE: B NATRIURETIC PEPTIDE 5: 208.4 pg/mL — AB (ref 0.0–100.0)

## 2015-06-16 LAB — MAGNESIUM: MAGNESIUM: 2 mg/dL (ref 1.7–2.4)

## 2015-06-16 MED ORDER — ASPIRIN 81 MG PO CHEW
81.0000 mg | CHEWABLE_TABLET | ORAL | Status: AC
  Administered 2015-06-17: 81 mg via ORAL
  Filled 2015-06-16: qty 1

## 2015-06-16 MED ORDER — OXYCODONE-ACETAMINOPHEN 5-325 MG PO TABS
1.0000 | ORAL_TABLET | ORAL | Status: DC | PRN
  Administered 2015-06-22: 1 via ORAL
  Filled 2015-06-16: qty 1

## 2015-06-16 MED ORDER — SODIUM CHLORIDE 0.9 % IJ SOLN
3.0000 mL | Freq: Two times a day (BID) | INTRAMUSCULAR | Status: DC
  Administered 2015-06-17 – 2015-06-24 (×12): 3 mL via INTRAVENOUS

## 2015-06-16 MED ORDER — DIGOXIN 125 MCG PO TABS
0.1250 mg | ORAL_TABLET | Freq: Every day | ORAL | Status: DC
  Administered 2015-06-17 – 2015-06-24 (×8): 0.125 mg via ORAL
  Filled 2015-06-16 (×8): qty 1

## 2015-06-16 MED ORDER — ALBUTEROL SULFATE (2.5 MG/3ML) 0.083% IN NEBU
2.5000 mg | INHALATION_SOLUTION | RESPIRATORY_TRACT | Status: DC | PRN
Start: 2015-06-16 — End: 2015-06-24

## 2015-06-16 MED ORDER — SODIUM CHLORIDE 0.9 % IV SOLN: Freq: Once | INTRAVENOUS | Status: DC

## 2015-06-16 MED ORDER — AMIODARONE HCL 200 MG PO TABS
200.0000 mg | ORAL_TABLET | Freq: Every day | ORAL | Status: DC
Start: 2015-06-17 — End: 2015-06-24
  Administered 2015-06-17 – 2015-06-24 (×8): 200 mg via ORAL
  Filled 2015-06-16 (×8): qty 1

## 2015-06-16 MED ORDER — SODIUM CHLORIDE 0.9 % IJ SOLN
3.0000 mL | Freq: Two times a day (BID) | INTRAMUSCULAR | Status: DC
Start: 2015-06-16 — End: 2015-06-17
  Administered 2015-06-16: 3 mL via INTRAVENOUS

## 2015-06-16 MED ORDER — FLUTICASONE PROPIONATE 50 MCG/ACT NA SUSP
1.0000 | Freq: Every day | NASAL | Status: DC
  Administered 2015-06-19 – 2015-06-24 (×6): 1 via NASAL
  Filled 2015-06-16: qty 16

## 2015-06-16 MED ORDER — OXYCODONE HCL 5 MG PO TABS: 5.0000 mg | ORAL_TABLET | ORAL | Status: DC | PRN

## 2015-06-16 MED ORDER — ALPRAZOLAM 0.5 MG PO TABS
1.0000 mg | ORAL_TABLET | Freq: Three times a day (TID) | ORAL | Status: DC
  Administered 2015-06-16 – 2015-06-24 (×17): 1 mg via ORAL
  Filled 2015-06-16 (×19): qty 2

## 2015-06-16 MED ORDER — OXYCODONE-ACETAMINOPHEN 10-325 MG PO TABS
1.0000 | ORAL_TABLET | ORAL | Status: DC | PRN
Start: 2015-06-16 — End: 2015-06-16

## 2015-06-16 MED ORDER — APIXABAN 5 MG PO TABS: 5.0000 mg | ORAL_TABLET | Freq: Two times a day (BID) | ORAL | Status: DC

## 2015-06-16 MED ORDER — ONDANSETRON HCL 4 MG/2ML IJ SOLN
4.0000 mg | Freq: Four times a day (QID) | INTRAMUSCULAR | Status: DC | PRN
  Filled 2015-06-16: qty 2

## 2015-06-16 MED ORDER — ACETAMINOPHEN 325 MG PO TABS: 650.0000 mg | ORAL_TABLET | ORAL | Status: DC | PRN

## 2015-06-16 MED ORDER — SODIUM CHLORIDE 0.9 % IV SOLN: 250.0000 mL | INTRAVENOUS | Status: DC | PRN

## 2015-06-16 MED ORDER — ASPIRIN 81 MG PO CHEW: 81.0000 mg | CHEWABLE_TABLET | Freq: Once | ORAL | Status: DC

## 2015-06-16 MED ORDER — SODIUM CHLORIDE 0.9 % IJ SOLN
3.0000 mL | INTRAMUSCULAR | Status: DC | PRN
Start: 2015-06-16 — End: 2015-06-17

## 2015-06-16 MED ORDER — SODIUM CHLORIDE 0.9 % IJ SOLN: 3.0000 mL | INTRAMUSCULAR | Status: DC | PRN

## 2015-06-16 MED ORDER — ALBUTEROL SULFATE 108 (90 BASE) MCG/ACT IN AEPB: 1.0000 | INHALATION_SPRAY | RESPIRATORY_TRACT | Status: DC | PRN

## 2015-06-16 MED ORDER — SODIUM CHLORIDE 0.9 % IV SOLN
INTRAVENOUS | Status: DC
Start: 2015-06-17 — End: 2015-06-17

## 2015-06-16 MED ORDER — SODIUM CHLORIDE 0.9 % IV SOLN
250.0000 mL | INTRAVENOUS | Status: DC | PRN
Start: 2015-06-16 — End: 2015-06-17

## 2015-06-16 NOTE — Telephone Encounter (Signed)
Pt's wife called concerned about CPX test, she feels pt is much worse than last week and feels he is not able to walk on treadmill.  She states she has noticed pt is more SOB with the slightest little activity and is much more weak and tired than he has been.  Discussed w/Dr Bensimhon via phone, he recommends pt can be direct admitted today if feeling that bad.  Pt's wife aware, she discussed w/pt and they are in agreement for admit today.  2H stepdown bed has been requested, they will await call to come to hospital, cpx cancelled
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
## 2015-06-16 NOTE — Progress Notes (Signed)
@  approx 2330 Dr. Gala Romney paged this RN to telephone order 2 Units PRBC for pt's anemia and to cancel Heart Cath in AM. MD also requested pt stool be tested for occult blood.
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
## 2015-06-17 ENCOUNTER — Encounter (HOSPITAL_COMMUNITY)
Admission: AD | Disposition: A | Discharge status: Home Health | Payer: Self-pay | Source: Ambulatory Visit | Attending: Internal Medicine

## 2015-06-17 ENCOUNTER — Inpatient Hospital Stay (HOSPITAL_COMMUNITY): Payer: Medicare HMO

## 2015-06-17 ENCOUNTER — Ambulatory Visit (HOSPITAL_COMMUNITY): Admission: RE | Admit: 2015-06-17 | Payer: Medicare HMO | Source: Ambulatory Visit | Admitting: Internal Medicine

## 2015-06-17 ENCOUNTER — Encounter (HOSPITAL_COMMUNITY): Payer: Self-pay | Admitting: *Deleted

## 2015-06-17 ENCOUNTER — Other Ambulatory Visit (HOSPITAL_COMMUNITY): Payer: Self-pay | Admitting: Internal Medicine

## 2015-06-17 DIAGNOSIS — D509 Iron deficiency anemia, unspecified: Secondary | ICD-10-CM

## 2015-06-17 DIAGNOSIS — I509 Heart failure, unspecified: Secondary | ICD-10-CM

## 2015-06-17 DIAGNOSIS — B182 Chronic viral hepatitis C: Secondary | ICD-10-CM

## 2015-06-17 DIAGNOSIS — D649 Anemia, unspecified: Secondary | ICD-10-CM

## 2015-06-17 DIAGNOSIS — R195 Other fecal abnormalities: Secondary | ICD-10-CM

## 2015-06-17 LAB — BASIC METABOLIC PANEL
ANION GAP: 7 (ref 5–15)
BUN: 15 mg/dL (ref 6–20)
CALCIUM: 8.6 mg/dL — AB (ref 8.9–10.3)
CO2: 25 mmol/L (ref 22–32)
CREATININE: 1.19 mg/dL (ref 0.61–1.24)
Chloride: 108 mmol/L (ref 101–111)
GFR calc Af Amer: >60 mL/min (ref >60)
GLUCOSE: 87 mg/dL (ref 65–99)
Potassium: 4.1 mmol/L (ref 3.5–5.1)
Sodium: 140 mmol/L (ref 135–145)

## 2015-06-17 LAB — CBC
HCT: 33.3 % — ABNORMAL LOW (ref 39.0–52.0)
HEMOGLOBIN: 9.9 g/dL — AB (ref 13.0–17.0)
MCH: 21.8 pg — ABNORMAL LOW (ref 26.0–34.0)
MCHC: 29.7 g/dL — AB (ref 30.0–36.0)
MCV: 73.2 fL — ABNORMAL LOW (ref 78.0–100.0)
PLATELETS: 217 10*3/uL (ref 150–400)
RBC: 4.55 MIL/uL (ref 4.22–5.81)
RDW: 20.9 % — AB (ref 11.5–15.5)
WBC: 5.4 10*3/uL (ref 4.0–10.5)

## 2015-06-17 LAB — CARBOXYHEMOGLOBIN
Carboxyhemoglobin: 2.1 % — ABNORMAL HIGH (ref 0.5–1.5)
Methemoglobin: 1.2 % (ref 0.0–1.5)
O2 SAT: 65.2 %
TOTAL HEMOGLOBIN: 10 g/dL — AB (ref 13.5–18.0)

## 2015-06-17 LAB — ABO/RH: ABO/RH(D): O POS

## 2015-06-17 LAB — OCCULT BLOOD X 1 CARD TO LAB, STOOL: FECAL OCCULT BLD: POSITIVE — AB

## 2015-06-17 SURGERY — RIGHT HEART CATH

## 2015-06-17 MED ORDER — SODIUM CHLORIDE 0.9 % IJ SOLN
10.0000 mL | INTRAMUSCULAR | Status: DC | PRN
  Administered 2015-06-21: 10 mL
  Filled 2015-06-17: qty 40

## 2015-06-17 MED ORDER — PEG-KCL-NACL-NASULF-NA ASC-C 100 G PO SOLR: 1.0000 | Freq: Once | ORAL | Status: DC

## 2015-06-17 MED ORDER — PEG 3350-KCL-NABCB-NACL-NASULF 236 G PO SOLR: 4000.0000 mL | Freq: Once | ORAL | Status: DC

## 2015-06-17 MED ORDER — PEG-KCL-NACL-NASULF-NA ASC-C 100 G PO SOLR
0.5000 | Freq: Once | ORAL | Status: AC
  Administered 2015-06-17: 100 g via ORAL
  Filled 2015-06-17: qty 1

## 2015-06-17 MED ORDER — SODIUM CHLORIDE 0.9 % IV SOLN
510.0000 mg | Freq: Once | INTRAVENOUS | Status: AC
  Administered 2015-06-17: 510 mg via INTRAVENOUS
  Filled 2015-06-17: qty 17

## 2015-06-17 MED ORDER — PEG 3350-KCL-NABCB-NACL-NASULF 236 G PO SOLR
4000.0000 mL | Freq: Once | ORAL | Status: DC
  Filled 2015-06-17: qty 4000

## 2015-06-17 MED ORDER — PEG-KCL-NACL-NASULF-NA ASC-C 100 G PO SOLR
0.5000 | Freq: Once | ORAL | Status: AC
  Administered 2015-06-18: 100 g via ORAL

## 2015-06-17 MED ORDER — PANTOPRAZOLE SODIUM 40 MG IV SOLR
40.0000 mg | Freq: Every day | INTRAVENOUS | Status: DC
  Administered 2015-06-17 – 2015-06-20 (×4): 40 mg via INTRAVENOUS
  Filled 2015-06-17 (×4): qty 40

## 2015-06-17 MED ORDER — PNEUMOCOCCAL VAC POLYVALENT 25 MCG/0.5ML IJ INJ
0.5000 mL | INJECTION | INTRAMUSCULAR | Status: AC
  Administered 2015-06-18: 0.5 mL via INTRAMUSCULAR
  Filled 2015-06-17: qty 0.5

## 2015-06-17 NOTE — Progress Notes (Signed)
Pt denies hematemesis or current black tarry stools but endorses that concurrently with the start of Eliquis in October his stools did become black but have since returned brown, hard, and small. Will convey to day RN and MD during rounds.

## 2015-06-17 NOTE — Progress Notes (Signed)
Initial Encounter with LVAD Team and MCS Introduction:  Dale [name] is a 14 yr. year old African American.   Clinical course with heart failure: Admitted with acute on chronic systolic HF r/t NICM. Was being managed by cardiology in [place], became hypotensive and intolerant of ACE/ARB etc. Chronic AFib. Referred to Dr. Gala [name] recently and has been seen in the clinic a few times.   VAD educational packet including "HM II Patient Handbook", "HM II Left Ventricular Assist System" packet, and "[place] HM II Patient Education" reviewed in detail with me and left at bedside for continued reference. Patient education DVD was given to her as well for reference should she want to see more about the equipment at home after reviewing the information I left her.   Explained that LVAD can be implanted for two indications in the setting of advanced left ventricular heart failure treatment:  1. Bridge to transplant - used for patients who cannot safely wait for heart transplant without this device.  Or   2. Destination therapy - used for patients until end of life or recovery of heart function.  Discussed that at this point the patient would be considered for Destination therapy should the patient be deemed an acceptable VAD candidate.   Provided brief equipment overview of the HeartMate II pump and discussed placement, surgical procedure, peripheral equipment, life-long coumadin therapy, importance of medication adherence and clinic follow up for as long as patient is living on support, life-style modifications, as well as need for caregiver to be successful with this therapy.   The patient is hoping it will not come to this but is willing to do whatever Dr. Gala [name] recommends; she was initially very tearful when she asked to see the pump, however we discussed that he is fortunate to have options at this time considering his goals and wishes for quality and longevity of life. We discussed the  process of the evaluation period and how a decision was made by the [facility] team whether she would be an appropriate candidate for therapy or not. Evaluation consent was reviewed and given for reference while the patient makes his/her decision to proceed with candidacy evaluation.   Caregiver Support: Wife   Home Inspection Checklist: not discussed at this time  Advised the patient review the materials, contact either myself or Dale [name] with questions and we will plan on meeting with her at next scheduled clinic appointment. Verbalized he/she would review the evaluation consent and make a decision regarding the evaluation process.   At this point after briefly reviewing her chart some points that would exclude the patient or make them extremely high risk to have LVAD surgery include:   Limitations with LUE however this has been his normal for >30 years and he has adjusted and is very independent  GI bleeding currently  Will FU with them again next week after RHC is performed to discuss options. 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      Session Time: 40 min  Dale Alberts, RN VAD Coordinator   Office: [phone] 24/7 VAD Pager: [phone]

## 2015-06-17 NOTE — Progress Notes (Signed)
UR COMPLETED  

## 2015-06-17 NOTE — Progress Notes (Signed)
Peripherally Inserted Central Catheter/Midline Placement  The IV Nurse has discussed with the patient and/or persons authorized to consent for the patient, the purpose of this procedure and the potential benefits and risks involved with this procedure.  The benefits include less needle sticks, lab draws from the catheter and patient may be discharged home with the catheter.  Risks include, but not limited to, infection, bleeding, blood clot (thrombus formation), and puncture of an artery; nerve damage and irregular heat beat.  Alternatives to this procedure were also discussed.  Consent obtained by Reginia Forts, RN  PICC/Midline Placement Documentation        Dale [name], Dale [name] 06/17/2015, 3:00 PM
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
## 2015-06-17 NOTE — Progress Notes (Signed)
Pt provided with cups of jello, and he has clear beverage from home.

## 2015-06-17 NOTE — Progress Notes (Signed)
  Patient's hemoglobin continues to drop. No active source of bleeding at this moment.  Transfuse 2u RBCs. Stop Eliquis. Check FOBT. Appears iron deficient - will likely need Feraheme in am.  Will consult Gi in am.  Cancel RHC.   Lacrystal Barbe,MD 12:07 AM

## 2015-06-17 NOTE — Progress Notes (Signed)
  Echocardiogram 2D Echocardiogram has been performed.  Dale [name] 06/17/2015, 10:48 AM
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
## 2015-06-18 ENCOUNTER — Encounter (HOSPITAL_COMMUNITY): Payer: Self-pay | Admitting: *Deleted

## 2015-06-18 ENCOUNTER — Encounter (HOSPITAL_COMMUNITY)
Admission: AD | Disposition: A | Discharge status: Home Health | Payer: Self-pay | Source: Ambulatory Visit | Attending: Internal Medicine

## 2015-06-18 DIAGNOSIS — I5043 Acute on chronic combined systolic (congestive) and diastolic (congestive) heart failure: Secondary | ICD-10-CM

## 2015-06-18 DIAGNOSIS — I130 Hypertensive heart and chronic kidney disease with heart failure and stage 1 through stage 4 chronic kidney disease, or unspecified chronic kidney disease: Secondary | ICD-10-CM | POA: Diagnosis not present

## 2015-06-18 DIAGNOSIS — N179 Acute kidney failure, unspecified: Secondary | ICD-10-CM

## 2015-06-18 HISTORY — PX: COLONOSCOPY: SHX5424

## 2015-06-18 HISTORY — PX: ESOPHAGOGASTRODUODENOSCOPY: SHX5428

## 2015-06-18 LAB — BASIC METABOLIC PANEL
ANION GAP: 9 (ref 5–15)
BUN: 10 mg/dL (ref 6–20)
CHLORIDE: 110 mmol/L (ref 101–111)
CO2: 21 mmol/L — AB (ref 22–32)
Calcium: 8.7 mg/dL — ABNORMAL LOW (ref 8.9–10.3)
Creatinine, Ser: 1.06 mg/dL (ref 0.61–1.24)
GFR calc non Af Amer: >60 mL/min (ref >60)
Glucose, Bld: 74 mg/dL (ref 65–99)
Potassium: 4.4 mmol/L (ref 3.5–5.1)
Sodium: 140 mmol/L (ref 135–145)

## 2015-06-18 LAB — TYPE AND SCREEN
ABO/RH(D): O POS
ANTIBODY SCREEN: NEGATIVE
UNIT DIVISION: 0
Unit division: 0

## 2015-06-18 LAB — HEMOGLOBIN AND HEMATOCRIT, BLOOD
HEMATOCRIT: 33.1 % — AB (ref 39.0–52.0)
Hemoglobin: 10.2 g/dL — ABNORMAL LOW (ref 13.0–17.0)

## 2015-06-18 SURGERY — COLONOSCOPY
Anesthesia: Moderate Sedation

## 2015-06-18 MED ORDER — SODIUM CHLORIDE 0.9 % IV SOLN: INTRAVENOUS | Status: DC

## 2015-06-18 MED ORDER — FENTANYL CITRATE (PF) 100 MCG/2ML IJ SOLN
INTRAMUSCULAR | Status: DC | PRN
  Administered 2015-06-18 (×3): 25 ug via INTRAVENOUS

## 2015-06-18 MED ORDER — ENOXAPARIN SODIUM 40 MG/0.4ML ~~LOC~~ SOLN
40.0000 mg | SUBCUTANEOUS | Status: DC
  Administered 2015-06-18: 40 mg via SUBCUTANEOUS
  Filled 2015-06-18: qty 0.4

## 2015-06-18 MED ORDER — MIDAZOLAM HCL 5 MG/5ML IJ SOLN
INTRAMUSCULAR | Status: DC | PRN
  Administered 2015-06-18: 1 mg via INTRAVENOUS
  Administered 2015-06-18: 2 mg via INTRAVENOUS

## 2015-06-18 MED ORDER — MIDAZOLAM HCL 10 MG/2ML IJ SOLN
INTRAMUSCULAR | Status: DC | PRN
  Administered 2015-06-18: 2 mg via INTRAVENOUS

## 2015-06-18 MED ORDER — FENTANYL CITRATE (PF) 100 MCG/2ML IJ SOLN
INTRAMUSCULAR | Status: AC
  Filled 2015-06-18: qty 2

## 2015-06-18 MED ORDER — MIDAZOLAM HCL 5 MG/ML IJ SOLN
INTRAMUSCULAR | Status: AC
  Filled 2015-06-18: qty 2

## 2015-06-18 MED ORDER — DIPHENHYDRAMINE HCL 50 MG/ML IJ SOLN
INTRAMUSCULAR | Status: DC | PRN
Start: 2015-06-18 — End: 2015-06-18
  Administered 2015-06-18 (×2): 12.5 mg via INTRAVENOUS

## 2015-06-18 MED ORDER — DIPHENHYDRAMINE HCL 50 MG/ML IJ SOLN
INTRAMUSCULAR | Status: AC
  Filled 2015-06-18: qty 1

## 2015-06-18 NOTE — Interval H&P Note (Signed)
History and Physical Interval Note:  06/18/2015 12:08 PM  Dale [name]  has presented today for surgery, with the diagnosis of anemia, FOBT +  The various methods of treatment have been discussed with the patient and family. After consideration of risks, benefits and other options for treatment, the patient has consented to  Procedure(s): COLONOSCOPY (N/A) ESOPHAGOGASTRODUODENOSCOPY (EGD) (N/A) as a surgical intervention .  The patient's history has been reviewed, patient examined, no change in status, stable for surgery.  I have reviewed the patient's chart and labs.  Questions were answered to the patient's satisfaction.     Asiyah Pineau D

## 2015-06-18 NOTE — Care Management Note (Addendum)
Case Management Note  Patient Details  Name: Dale [name] MRN: [id number] Date of Birth: 01/16/1946  Subjective/Objective:                Admitted with  A/C systolic heart failure and suspected low output physiology. Hx of  CHF,microcytic anemia, HCV. Lives with wife. Wife minimally assists with ADL's. No DME usage.    Action/Plan:   Plan:GI following- colonoscopy and upper endoscopy scheduled, and  CHF team/ Dr.B.- RHC to be done.  Return to home when medically stable. CM to f/u with d/c needs.   Expected Discharge Date:                  Expected Discharge Plan:  Home/Self Care  In-House Referral:     Discharge planning Services  CM Consult  Post Acute Care Choice:    Choice offered to:     DME Arranged:    DME Agency:     HH Arranged:    HH Agency:     Status of Service:  In process, will continue to follow  Medicare Important Message Given:    Date Medicare IM Given:    Medicare IM give by:    Date Additional Medicare IM Given:    Additional Medicare Important Message give by:     If discussed at Long Length of Stay Meetings, dates discussed:    Additional Comments: CM received consult : Heart failure home health screen and may place order for PT/OT eval and treat if indicated. CM spoke to pt /wife regarding HF. Wife stated is very much aware of managing CHF and teaching has been done in the past with she and husband. Wife states own scale and pt weighs daily, and Dr. Gala Romney follows pt's CHF. PT/OT consults placed per CM . CM to f/u with needs.  Rigel Koyanagi (Spouse) [phone], [place] Ponder (Daughter)  [phone]    Epifanio Lesches, [place] [phone] 06/18/2015, 9:26 AM
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
## 2015-06-18 NOTE — Op Note (Signed)
[name] [address] [place], [address]   ENDOSCOPY PROCEDURE REPORT  PATIENT: Dale [name], Dale [name]  MR#: [id number] BIRTHDATE: 11-03-1945 , 69  yrs. old GENDER: male ENDOSCOPIST:Chancey Cullinane Elnoria Howard, MD REFERRED BY: PROCEDURE DATE:  2015/07/16 PROCEDURE:   EGD, diagnostic ASA CLASS:    Class III INDICATIONS: Iron deficiency anemia MEDICATION: Versed 5 mg IV, Fentanyl 75 mcg IV, and Benadryl 25 mg IV TOPICAL ANESTHETIC:   none  DESCRIPTION OF PROCEDURE:   After the risks and benefits of the procedure were explained, informed consent was obtained.  The endoscope was introduced through the mouth  and advanced to the second portion of the duodenum .  The instrument was slowly withdrawn as the mucosa was fully examined. Estimated blood loss is zero unless otherwise noted in this procedure report.    FINDINGS: The esophagus and gastroesophageal junction were completely normal in appearance.  The stomach was entered and closely examined.The antrum, angularis, and lesser curvature were well visualized, including a retroflexed view of the cardia and fundus.  The stomach wall was normally distensable.  The scope passed easily through the pylorus into the duodenum.    Retroflexed views revealed no abnormalities.    The scope was then withdrawn from the patient and the procedure completed.  COMPLICATIONS: There were no immediate complications.  ENDOSCOPIC IMPRESSION: 1) Normal EGD.  RECOMMENDATIONS: 1) Proceed with the colonoscopy.   _______________________________ eSignedJeani Hawking, MD 2015/07/16 12:47 PM     cc:  CPT CODES: ICD CODES:  The ICD and CPT codes recommended by this software are interpretations from the data that the clinical staff has captured with the software.  The verification of the translation of this report to the ICD and CPT codes and modifiers is the sole responsibility of the health care institution and  practicing physician where this report was generated.  [employer] will not be held responsible for the validity of the ICD and CPT codes included on this report.  AMA assumes no liability for data contained or not contained herein. CPT is a Publishing rights manager of the [employer].

## 2015-06-18 NOTE — Progress Notes (Signed)
RN noted that patient continually disconnects himself from the monitor for frequent toileting needs (d/t GI prep from this AM) and continues to be forgetful in reconnecting himself. Patient expressed frustration regarding "being tied up to the monitor". RN voiced concern that patients heart failure needed to be monitored and patient and RN came to the conclusion that RN will "spot check" patient Q 4 hours, recording vital signs and rhythm. Pt expressed pleasure and compliance with suggested regimen. RN will continue to monitor.

## 2015-06-18 NOTE — Progress Notes (Signed)
I spoke with Dr. Gala Romney and his family.  The patient has significant cardiac issues and requires further evaluation and treatment.  He cannot go home to have an outpatient capsule endoscopy.  The GI tract needs to be cleared before the cardiac evaluation/treatment can proceed.  I will have him undergo a capsule endoscopy on Monday.  There is no availability for the capsules tomorrow.
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
## 2015-06-19 DIAGNOSIS — K921 Melena: Secondary | ICD-10-CM

## 2015-06-19 LAB — BASIC METABOLIC PANEL
ANION GAP: 8 (ref 5–15)
BUN: 11 mg/dL (ref 6–20)
CALCIUM: 8.8 mg/dL — AB (ref 8.9–10.3)
CO2: 25 mmol/L (ref 22–32)
CREATININE: 1.15 mg/dL (ref 0.61–1.24)
Chloride: 106 mmol/L (ref 101–111)
GFR calc Af Amer: >60 mL/min (ref >60)
GFR calc non Af Amer: >60 mL/min (ref >60)
GLUCOSE: 77 mg/dL (ref 65–99)
Potassium: 4.3 mmol/L (ref 3.5–5.1)
Sodium: 139 mmol/L (ref 135–145)

## 2015-06-19 LAB — HEPATITIS C GENOTYPE

## 2015-06-19 MED ORDER — APIXABAN 5 MG PO TABS
5.0000 mg | ORAL_TABLET | Freq: Two times a day (BID) | ORAL | Status: DC
  Administered 2015-06-19 – 2015-06-21 (×5): 5 mg via ORAL
  Filled 2015-06-19 (×5): qty 1

## 2015-06-19 NOTE — Progress Notes (Signed)
Subjective: No complaints.  Feeling well.  Objective: Vital signs in last 24 hours: Temp:  [97.4 F (36.3 C)-98.5 F (36.9 C)] 98.2 F (36.8 C) (01/15 0356) Pulse Rate:  [56-79] 67 (01/15 0356) Resp:  [10-22] 15 (01/15 0356) BP: (99-215)/(41-84) 120/41 mmHg (01/15 0356) SpO2:  [95 %-100 %] 100 % (01/15 0356) Weight:  [66.452 kg (146 lb 8 oz)] 66.452 kg (146 lb 8 oz) (01/15 0356)    Intake/Output from previous day: 01/14 0701 - 01/15 0700 In: 120 [P.O.:120] Out: -  Intake/Output this shift:    General appearance: alert and no distress GI: soft, non-tender; bowel sounds normal; no masses,  no organomegaly  Lab Results:  Recent Labs  06/16/15 1944 06/16/15 2245 06/17/15 0845 06/18/15 0306  WBC 5.9 6.5 5.4  --   HGB 7.7* 7.1* 9.9* 10.2*  HCT 27.0* 25.2* 33.3* 33.1*  PLT 209 193 217  --    BMET  Recent Labs  06/16/15 2245 06/17/15 0845 06/18/15 0306  NA 140 140 140  K 3.9 4.1 4.4  CL 106 108 110  CO2 28 25 21*  GLUCOSE 128* 87 74  BUN 18 15 10   CREATININE 1.45* 1.19 1.06  CALCIUM 8.5* 8.6* 8.7*   LFT  Recent Labs  06/16/15 1944  PROT 6.6  ALBUMIN 3.2*  AST 34  ALT 20  ALKPHOS 115  BILITOT 0.9   PT/INR  Recent Labs  06/16/15 2245  LABPROT 16.3*  INR 1.30   Hepatitis Panel No results for input(s): HEPBSAG, HCVAB, HEPAIGM, HEPBIGM in the last 72 hours. C-Diff No results for input(s): CDIFFTOX in the last 72 hours. Fecal Lactopherrin No results for input(s): FECLLACTOFRN in the last 72 hours.  Studies/Results: No results found.  Medications:  Scheduled: . ALPRAZolam  1 mg Oral TID  . amiodarone  200 mg Oral Daily  . digoxin  0.125 mg Oral Daily  . enoxaparin (LOVENOX) injection  40 mg Subcutaneous Q24H  . fluticasone  1 spray Each Nare Daily  . pantoprazole (PROTONIX) IV  40 mg Intravenous Daily  . sodium chloride  3 mL Intravenous Q12H   Continuous: . sodium chloride      Assessment/Plan: 1) Severe IDA - ? Source. 2) Severe  CHF.   No complaints post procedure.  No evidence of any malignant source for the bleeding.  He is scheduled for a capsule endoscopy tomorrow.  Plan: 1) Capsule endo tomorrow. 2) Continue with supportive care.  LOS: 3 days   Nahima Ales D 06/19/2015, 7:25 AM
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
## 2015-06-19 NOTE — Progress Notes (Signed)
ANTICOAGULATION CONSULT NOTE - Follow Up Consult  Pharmacy Consult for Eliquis Indication: atrial fibrillation  Allergies  Allergen Reactions  . Bee Venom Anaphylaxis    Patient Measurements: Height: 5\' 8"  (172.7 cm) Weight: 146 lb 8 oz (66.452 kg) IBW/kg (Calculated) : 68.4  Vital Signs: Temp: 98.3 F (36.8 C) (01/15 1520) Temp Source: Oral (01/15 1520) BP: 112/72 mmHg (01/15 1520) Pulse Rate: 43 (01/15 1520)  Labs:  Recent Labs  06/16/15 1944 06/16/15 2245 06/17/15 0845 06/18/15 0306 06/19/15 1149  HGB 7.7* 7.1* 9.9* 10.2*  --   HCT 27.0* 25.2* 33.3* 33.1*  --   PLT 209 193 217  --   --   LABPROT  --  16.3*  --   --   --   INR  --  1.30  --   --   --   CREATININE 1.34* 1.45* 1.19 1.06 1.15    Estimated Creatinine Clearance: 56.2 mL/min (by C-G formula based on Cr of 1.15).  Assessment:    Eliquis for afib had been on hold due to anemia without overt bleeding.  Last dose 1/12 am prior to admission.   Received transfusion and Feraheme on 1/13.  Lovenox 40 mg given on 1/14.   EGD and colonoscopy negative on 1/14.  For capsule endoscopy on 1/16.  Goal of Therapy:  appropriate Eliquis dose for indication Monitor platelets by anticoagulation protocol: Yes   Plan:   Eliquis 5 mg BID resumed this morning.  Intermittent CBC.  Follow up for any bleeding.  Dennie Fetters, [place] Pager: [phone] 06/19/2015,5:20 PM

## 2015-06-20 ENCOUNTER — Inpatient Hospital Stay (HOSPITAL_COMMUNITY): Payer: Medicare HMO

## 2015-06-20 ENCOUNTER — Encounter (HOSPITAL_COMMUNITY): Payer: Self-pay | Admitting: Gastroenterology

## 2015-06-20 ENCOUNTER — Telehealth: Payer: Self-pay | Admitting: Licensed Clinical Social Worker

## 2015-06-20 ENCOUNTER — Encounter (HOSPITAL_COMMUNITY): Payer: Medicare HMO | Admitting: Internal Medicine

## 2015-06-20 DIAGNOSIS — D509 Iron deficiency anemia, unspecified: Secondary | ICD-10-CM | POA: Insufficient documentation

## 2015-06-20 DIAGNOSIS — Z515 Encounter for palliative care: Secondary | ICD-10-CM

## 2015-06-20 LAB — CBC
HCT: 36.4 % — ABNORMAL LOW (ref 39.0–52.0)
HEMOGLOBIN: 10.8 g/dL — AB (ref 13.0–17.0)
MCH: 22.1 pg — AB (ref 26.0–34.0)
MCHC: 29.7 g/dL — ABNORMAL LOW (ref 30.0–36.0)
MCV: 74.4 fL — AB (ref 78.0–100.0)
PLATELETS: 213 10*3/uL (ref 150–400)
RBC: 4.89 MIL/uL (ref 4.22–5.81)
RDW: 21.8 % — ABNORMAL HIGH (ref 11.5–15.5)
WBC: 5.2 10*3/uL (ref 4.0–10.5)

## 2015-06-20 LAB — CARBOXYHEMOGLOBIN
Carboxyhemoglobin: 1.6 % — ABNORMAL HIGH (ref 0.5–1.5)
METHEMOGLOBIN: 0.9 % (ref 0.0–1.5)
O2 Saturation: 74.2 %
Total hemoglobin: 10.4 g/dL — ABNORMAL LOW (ref 13.5–18.0)

## 2015-06-20 MED ORDER — POLYETHYLENE GLYCOL 3350 17 G PO PACK
17.0000 g | PACK | ORAL | Status: AC
  Administered 2015-06-20 (×2): 17 g via ORAL
  Filled 2015-06-20: qty 1
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
## 2015-06-20 NOTE — Progress Notes (Signed)
CARDIAC REHAB PHASE I   PRE:  Rate/Rhythm: 74 SR  BP:  Sitting: 96/59        SaO2: 100 RA  MODE:  Ambulation: 1200 ft   POST:  Rate/Rhythm: 107 ST  BP:  Sitting: 101/87         SaO2: 100 RA  Pt sitting up on edge of bed finishing breakfast. Pt states he has been walking with his wife 5 laps at a time around the unit. Pt agreeable to ambulate this morning. Pt ambulated 1200 ft on RA, hand held assist, brisk, steady gait, tolerated well. Pt denies pain, dizziness, DOE, declined rest stop. Pt has CHF book at bedside, he and his wife state that at this time they have several questions for the doctor/VAD team. Will follow up for appropriate CHF education. Pt assisted to bathroom after walk, wife at bedside, instructed to call for assistance when finished. Very nice man. Will follow.  [phone] Joylene Grapes, RN, BSN 06/20/2015 8:40 AM
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
## 2015-06-20 NOTE — Discharge Instructions (Signed)

## 2015-06-20 NOTE — Care Management Important Message (Signed)
Important Message  Patient Details  Name: Dale [name] MRN: [id number] Date of Birth: 1946/04/16   Medicare Important Message Given:  Yes    [name] 06/20/2015, 12:40 PM
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
## 2015-06-20 NOTE — Care Management Note (Signed)
Case Management Note  Patient Details  Name: Dale [name] MRN: [id number] Date of Birth: 22-Feb-1946  Subjective/Objective:    Date: 06/20/15 Spoke with patient at the bedside.  Introduced self as Sports coach and explained role in discharge planning and how to be reached.  Verified patient lives in [place],  with spouse.  Expressed potential need for no other DME.  Verified patient anticipates to go home with family, at time of discharge and will have full-time supervision by family  at this time to best of their knowledge.  Patient denied needing help with their medication.  Patient is driven by wife to MD appointments.  Verified patient has PCP Betti Resse.   Plan: CM will continue to follow for discharge planning and [facility] resources.                 Action/Plan: For Capsule Endo study, also VAD w/u has begun.  Conts with CVP monitoring.   Expected Discharge Date:                  Expected Discharge Plan:  Home/Self Care  In-House Referral:     Discharge planning Services  CM Consult  Post Acute Care Choice:    Choice offered to:     DME Arranged:    DME Agency:     HH Arranged:    HH Agency:     Status of Service:  In process, will continue to follow  Medicare Important Message Given:  Yes Date Medicare IM Given:    Medicare IM give by:    Date Additional Medicare IM Given:    Additional Medicare Important Message give by:     If discussed at Long Length of Stay Meetings, dates discussed:    Additional Comments:  Leone Haven, RN 06/20/2015, 2:10 PM
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
## 2015-06-20 NOTE — Telephone Encounter (Signed)
CSW received referral to complete LVAD assessment. CSW contacted wife to arrange time for tomorrow 06/21/15 at 1:30pm in patient's room on 3S. CSW will follow up tomorrow. Lasandra Beech, LCSW [phone]

## 2015-06-21 ENCOUNTER — Telehealth (HOSPITAL_COMMUNITY): Payer: Self-pay | Admitting: *Deleted

## 2015-06-21 ENCOUNTER — Other Ambulatory Visit (HOSPITAL_COMMUNITY): Payer: Medicare HMO

## 2015-06-21 ENCOUNTER — Encounter (HOSPITAL_COMMUNITY)
Admission: AD | Disposition: A | Discharge status: Home Health | Payer: Self-pay | Source: Ambulatory Visit | Attending: Internal Medicine

## 2015-06-21 ENCOUNTER — Encounter (HOSPITAL_COMMUNITY): Payer: Self-pay | Admitting: *Deleted

## 2015-06-21 ENCOUNTER — Encounter: Payer: Self-pay | Admitting: Internal Medicine

## 2015-06-21 ENCOUNTER — Inpatient Hospital Stay (HOSPITAL_COMMUNITY): Payer: Medicare HMO

## 2015-06-21 ENCOUNTER — Encounter (HOSPITAL_COMMUNITY): Payer: Medicare HMO

## 2015-06-21 DIAGNOSIS — E440 Moderate protein-calorie malnutrition: Secondary | ICD-10-CM | POA: Insufficient documentation

## 2015-06-21 HISTORY — PX: GIVENS CAPSULE STUDY: SHX5432

## 2015-06-21 LAB — PULMONARY FUNCTION TEST
DL/VA % pred: 66 %
DL/VA: 2.84 ml/min/mmHg/L
DLCO UNC % PRED: 39 %
DLCO unc: 10.05 ml/min/mmHg
FEF 25-75 PRE: 1.23 L/s
FEF2575-%PRED-PRE: 60 %
FEV1-%PRED-PRE: 78 %
FEV1-PRE: 1.81 L
FEV1FVC-%PRED-PRE: 94 %
FEV6-%Pred-Pre: 86 %
FEV6-Pre: 2.53 L
FEV6FVC-%Pred-Pre: 105 %
FVC-%PRED-PRE: 81 %
FVC-PRE: 2.53 L
Pre FEV1/FVC ratio: 72 %
Pre FEV6/FVC Ratio: 100 %
RV % pred: 83 %
RV: 1.83 L
TLC % PRED: 68 %
TLC: 4.14 L

## 2015-06-21 LAB — CARBOXYHEMOGLOBIN
CARBOXYHEMOGLOBIN: 2.1 % — AB (ref 0.5–1.5)
Carboxyhemoglobin: 1.7 % — ABNORMAL HIGH (ref 0.5–1.5)
METHEMOGLOBIN: 0.6 % (ref 0.0–1.5)
Methemoglobin: 0.7 % (ref 0.0–1.5)
O2 SAT: 99.6 %
O2 SAT: 60.9 %
TOTAL HEMOGLOBIN: 10.6 g/dL — AB (ref 13.5–18.0)
Total hemoglobin: 10.2 g/dL — ABNORMAL LOW (ref 13.5–18.0)

## 2015-06-21 LAB — CBC
HEMATOCRIT: 32.5 % — AB (ref 39.0–52.0)
HEMOGLOBIN: 9.9 g/dL — AB (ref 13.0–17.0)
MCH: 22.8 pg — ABNORMAL LOW (ref 26.0–34.0)
MCHC: 30.5 g/dL (ref 30.0–36.0)
MCV: 74.9 fL — ABNORMAL LOW (ref 78.0–100.0)
Platelets: 172 10*3/uL (ref 150–400)
RBC: 4.34 MIL/uL (ref 4.22–5.81)
RDW: 22.5 % — ABNORMAL HIGH (ref 11.5–15.5)
WBC: 5.3 10*3/uL (ref 4.0–10.5)

## 2015-06-21 LAB — BASIC METABOLIC PANEL
ANION GAP: 7 (ref 5–15)
BUN: 9 mg/dL (ref 6–20)
CO2: 25 mmol/L (ref 22–32)
Calcium: 8.8 mg/dL — ABNORMAL LOW (ref 8.9–10.3)
Chloride: 107 mmol/L (ref 101–111)
Creatinine, Ser: 1.07 mg/dL (ref 0.61–1.24)
GLUCOSE: 84 mg/dL (ref 65–99)
POTASSIUM: 4.2 mmol/L (ref 3.5–5.1)
Sodium: 139 mmol/L (ref 135–145)

## 2015-06-21 LAB — PLATELET INHIBITION P2Y12: PLATELET FUNCTION P2Y12: 310 [PRU] (ref 194–418)

## 2015-06-21 LAB — PSA: PSA: 0.5 ng/mL (ref 0.00–4.00)

## 2015-06-21 LAB — URIC ACID: Uric Acid, Serum: 2.7 mg/dL — ABNORMAL LOW (ref 4.4–7.6)

## 2015-06-21 LAB — PREALBUMIN: Prealbumin: 17.2 mg/dL — ABNORMAL LOW (ref 18–38)

## 2015-06-21 LAB — ANTITHROMBIN III: AntiThromb III Func: 85 % (ref 75–120)

## 2015-06-21 LAB — LACTATE DEHYDROGENASE: LDH: 156 U/L (ref 98–192)

## 2015-06-21 SURGERY — IMAGING PROCEDURE, GI TRACT, INTRALUMINAL, VIA CAPSULE
Anesthesia: LOCAL

## 2015-06-21 SURGICAL SUPPLY — 1 items: TOWEL COTTON PACK 4EA (MISCELLANEOUS) ×4 IMPLANT

## 2015-06-21 NOTE — Progress Notes (Signed)
Pt talking with CSW. Will try to f/u tomorrow. Has been walking. Ethelda Chick CES, ACSM 2:29 PM 06/21/2015
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
## 2015-06-21 NOTE — Telephone Encounter (Signed)
Pt approved for [facility] 1st available not until 07/04/15 need to extend authorization with insurance. Pt is currently admitted will reschedule/extend precert once patient is discharged.

## 2015-06-21 NOTE — Progress Notes (Signed)
Paged MD regarding pt needing eliquis d/c'd for tonight and AM for heart cath in AM. Received call back and order to d/c eliquis. Will continue to monitor pt.
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
## 2015-06-22 ENCOUNTER — Encounter (HOSPITAL_COMMUNITY)
Admission: AD | Disposition: A | Discharge status: Home Health | Payer: Self-pay | Source: Ambulatory Visit | Attending: Internal Medicine

## 2015-06-22 ENCOUNTER — Encounter (HOSPITAL_COMMUNITY): Payer: Self-pay | Admitting: Internal Medicine

## 2015-06-22 ENCOUNTER — Ambulatory Visit (HOSPITAL_COMMUNITY): Payer: Medicare HMO

## 2015-06-22 DIAGNOSIS — I509 Heart failure, unspecified: Secondary | ICD-10-CM

## 2015-06-22 DIAGNOSIS — I5023 Acute on chronic systolic (congestive) heart failure: Secondary | ICD-10-CM

## 2015-06-22 DIAGNOSIS — K31819 Angiodysplasia of stomach and duodenum without bleeding: Secondary | ICD-10-CM | POA: Insufficient documentation

## 2015-06-22 HISTORY — PX: CARDIAC CATHETERIZATION: SHX172

## 2015-06-22 LAB — POCT I-STAT 3, VENOUS BLOOD GAS (G3P V)
ACID-BASE DEFICIT: 1 mmol/L (ref 0.0–2.0)
Acid-base deficit: 1 mmol/L (ref 0.0–2.0)
BICARBONATE: 23.6 meq/L (ref 20.0–24.0)
BICARBONATE: 23.9 meq/L (ref 20.0–24.0)
O2 SAT: 64 %
O2 Saturation: 65 %
PCO2 VEN: 39.6 mmHg — AB (ref 45.0–50.0)
PCO2 VEN: 40.1 mmHg — AB (ref 45.0–50.0)
PH VEN: 7.384 — AB (ref 7.250–7.300)
PO2 VEN: 34 mmHg (ref 30.0–45.0)
TCO2: 25 mmol/L (ref 0–100)
TCO2: 25 mmol/L (ref 0–100)
pH, Ven: 7.383 — ABNORMAL HIGH (ref 7.250–7.300)
pO2, Ven: 34 mmHg (ref 30.0–45.0)

## 2015-06-22 LAB — CBC
HCT: 32 % — ABNORMAL LOW (ref 39.0–52.0)
Hemoglobin: 9.5 g/dL — ABNORMAL LOW (ref 13.0–17.0)
MCH: 22.5 pg — ABNORMAL LOW (ref 26.0–34.0)
MCHC: 29.7 g/dL — ABNORMAL LOW (ref 30.0–36.0)
MCV: 75.7 fL — ABNORMAL LOW (ref 78.0–100.0)
PLATELETS: 154 10*3/uL (ref 150–400)
RBC: 4.23 MIL/uL (ref 4.22–5.81)
RDW: 23.6 % — AB (ref 11.5–15.5)
WBC: 5.8 10*3/uL (ref 4.0–10.5)

## 2015-06-22 LAB — BASIC METABOLIC PANEL
ANION GAP: 4 — AB (ref 5–15)
BUN: 11 mg/dL (ref 6–20)
CALCIUM: 8.5 mg/dL — AB (ref 8.9–10.3)
CO2: 26 mmol/L (ref 22–32)
CREATININE: 1.09 mg/dL (ref 0.61–1.24)
Chloride: 109 mmol/L (ref 101–111)
Glucose, Bld: 112 mg/dL — ABNORMAL HIGH (ref 65–99)
Potassium: 3.9 mmol/L (ref 3.5–5.1)
SODIUM: 139 mmol/L (ref 135–145)

## 2015-06-22 LAB — LUPUS ANTICOAGULANT PANEL
DRVVT: 49 s — ABNORMAL HIGH (ref 0.0–44.0)
PTT Lupus Anticoagulant: 34.5 s (ref 0.0–40.6)

## 2015-06-22 LAB — PROTIME-INR
INR: 1.31 (ref 0.00–1.49)
Prothrombin Time: 16.4 seconds — ABNORMAL HIGH (ref 11.6–15.2)

## 2015-06-22 LAB — DRVVT MIX: DRVVT MIX: 43 s (ref 0.0–44.0)

## 2015-06-22 LAB — HEMOGLOBIN A1C
HEMOGLOBIN A1C: 5.5 % (ref 4.8–5.6)
MEAN PLASMA GLUCOSE: 111 mg/dL

## 2015-06-22 LAB — HEPATITIS B SURFACE ANTIBODY,QUALITATIVE: Hep B S Ab: NONREACTIVE

## 2015-06-22 SURGERY — RIGHT HEART CATH

## 2015-06-22 MED ORDER — SODIUM CHLORIDE 0.9 % IJ SOLN
3.0000 mL | Freq: Two times a day (BID) | INTRAMUSCULAR | Status: DC
  Administered 2015-06-23 – 2015-06-24 (×2): 3 mL via INTRAVENOUS

## 2015-06-22 MED ORDER — SODIUM CHLORIDE 0.9 % IV SOLN: 250.0000 mL | INTRAVENOUS | Status: DC | PRN

## 2015-06-22 MED ORDER — HEPARIN (PORCINE) IN NACL 2-0.9 UNIT/ML-% IJ SOLN
INTRAMUSCULAR | Status: AC
  Filled 2015-06-22: qty 500

## 2015-06-22 MED ORDER — SODIUM CHLORIDE 0.9 % IJ SOLN
3.0000 mL | Freq: Two times a day (BID) | INTRAMUSCULAR | Status: DC
  Administered 2015-06-22 – 2015-06-24 (×4): 3 mL via INTRAVENOUS

## 2015-06-22 MED ORDER — APIXABAN 5 MG PO TABS
5.0000 mg | ORAL_TABLET | Freq: Two times a day (BID) | ORAL | Status: DC
  Administered 2015-06-22 – 2015-06-24 (×4): 5 mg via ORAL
  Filled 2015-06-22 (×4): qty 1

## 2015-06-22 MED ORDER — SODIUM CHLORIDE 0.9 % IJ SOLN
3.0000 mL | Freq: Two times a day (BID) | INTRAMUSCULAR | Status: DC
  Administered 2015-06-22: 3 mL via INTRAVENOUS

## 2015-06-22 MED ORDER — SODIUM CHLORIDE 0.9 % IJ SOLN
3.0000 mL | INTRAMUSCULAR | Status: DC | PRN
Start: 2015-06-22 — End: 2015-06-24

## 2015-06-22 MED ORDER — SODIUM CHLORIDE 0.9 % IJ SOLN: 3.0000 mL | INTRAMUSCULAR | Status: DC | PRN

## 2015-06-22 MED ORDER — HEPARIN (PORCINE) IN NACL 2-0.9 UNIT/ML-% IJ SOLN
INTRAMUSCULAR | Status: DC | PRN
  Administered 2015-06-22: 11:00:00

## 2015-06-22 MED ORDER — SODIUM CHLORIDE 0.9 % IV SOLN: INTRAVENOUS | Status: DC

## 2015-06-22 MED ORDER — ACETAMINOPHEN 325 MG PO TABS: 650.0000 mg | ORAL_TABLET | ORAL | Status: DC | PRN

## 2015-06-22 MED ORDER — ONDANSETRON HCL 4 MG/2ML IJ SOLN: 4.0000 mg | Freq: Four times a day (QID) | INTRAMUSCULAR | Status: DC | PRN

## 2015-06-22 MED ORDER — LIDOCAINE HCL (PF) 1 % IJ SOLN
INTRAMUSCULAR | Status: AC
  Filled 2015-06-22: qty 30

## 2015-06-22 SURGICAL SUPPLY — 5 items
CATH SWAN GANZ 7F STRAIGHT (CATHETERS) ×3 IMPLANT
KIT HEART RIGHT NAMIC (KITS) ×3 IMPLANT
PACK CARDIAC CATHETERIZATION (CUSTOM PROCEDURE TRAY) ×3 IMPLANT
SHEATH PINNACLE 7F 10CM (SHEATH) ×3 IMPLANT
TRANSDUCER W/STOPCOCK (MISCELLANEOUS) ×3 IMPLANT

## 2015-06-22 NOTE — Progress Notes (Signed)
CVP 4 

## 2015-06-22 NOTE — Progress Notes (Signed)
  Advanced Heart Failure Rounding Note   Subjective:    Underwent EGD and colonoscopy 1/14. No source of bleeding. Capsule endo completed. hoepfully will get results today. No overt bleeding. Hgb stable on Eliquis.  Denies SOB, orthopnea or PND. Walking around room.    Objective:   Weight Range:  Vital Signs:   Temp:  [98 F (36.7 C)-98.4 F (36.9 C)] 98 F (36.7 C) (01/18 0730) Pulse Rate:  [70-86] 76 (01/18 0310) Resp:  [14-21] 21 (01/18 0310) BP: (109-128)/(61-80) 119/67 mmHg (01/18 0730) SpO2:  [98 %-100 %] 98 % (01/18 0310) Weight:  [66.1 kg (145 lb 11.6 oz)] 66.1 kg (145 lb 11.6 oz) (01/18 0441) Last BM Date: 06/22/15  Weight change: Filed Weights   06/21/15 0500 06/21/15 0908 06/22/15 0441  Weight: 69.355 kg (152 lb 14.4 oz) 68.947 kg (152 lb) 66.1 kg (145 lb 11.6 oz)    Intake/Output:   Intake/Output Summary (Last 24 hours) at 06/22/15 1037 Last data filed at 06/22/15 0500  Gross per 24 hour  Intake    683 ml  Output   1150 ml  Net   -467 ml     Physical Exam: General: Sitting on edge of bed. Elderly appearing. HEENT: normal Neck: supple. JVP 8 cm. Carotids 2+ bilat; no bruits. No thyromegaly or nodule noted. Cor: PMI laterally displaced. Regular rate & rhythm. No rubs, or murmurs. + S3  Lungs: clear Abdomen: soft, nontender, nondistended. No hepatosplenomegaly. No bruits or masses. Good bowel sounds. Extremities: no cyanosis, clubbing, rash, no edema LUE atrophied and plegic Neuro: alert & orientedx3,   Telemetry: Reviewed personally,  NSR 60-70s  Labs: Basic Metabolic Panel:  Recent Labs Lab 06/16/15 1944  06/17/15 0845 06/18/15 0306 06/19/15 1149 06/21/15 0638 06/22/15 0430  NA 141  < > 140 140 139 139 139  K 4.2  < > 4.1 4.4 4.3 4.2 3.9  CL 106  < > 108 110 106 107 109  CO2 26  < > 25 21* 25 25 26  GLUCOSE 100*  < > 87 74 77 84 112*  BUN 17  < > 15 10 11 9 11  CREATININE 1.34*  < > 1.19 1.06 1.15 1.07 1.09  CALCIUM 8.6*  < >  8.6* 8.7* 8.8* 8.8* 8.5*  MG 2.0  --   --   --   --   --   --   < > = values in this interval not displayed.  Liver Function Tests:  Recent Labs Lab 06/16/15 1944  AST 34  ALT 20  ALKPHOS 115  BILITOT 0.9  PROT 6.6  ALBUMIN 3.2*   No results for input(s): LIPASE, AMYLASE in the last 168 hours. No results for input(s): AMMONIA in the last 168 hours.  CBC:  Recent Labs Lab 06/16/15 1944 06/16/15 2245 06/17/15 0845 06/18/15 0306 06/20/15 1050 06/21/15 0638 06/22/15 0430  WBC 5.9 6.5 5.4  --  5.2 5.3 5.8  NEUTROABS 2.8 2.8  --   --   --   --   --   HGB 7.7* 7.1* 9.9* 10.2* 10.8* 9.9* 9.5*  HCT 27.0* 25.2* 33.3* 33.1* 36.4* 32.5* 32.0*  MCV 70.3* 70.2* 73.2*  --  74.4* 74.9* 75.7*  PLT 209 193 217  --  213 172 154    Cardiac Enzymes: No results for input(s): CKTOTAL, CKMB, CKMBINDEX, TROPONINI in the last 168 hours.  BNP: BNP (last 3 results)  Recent Labs  03/05/15 2334 03/19/15 1239 06/16/15 1944  BNP   823.0* 1285.0* 208.4*    ProBNP (last 3 results) No results for input(s): PROBNP in the last 8760 hours.    Other results:  Imaging: Dg Orthopantogram  06/20/2015  CLINICAL DATA:  Left submandibular gland calculus. EXAM: ORTHOPANTOGRAM/PANORAMIC COMPARISON:  Neck CT 01/07/2012 FINDINGS: There is a large, 11.5 mm, calculus in the left submandibular area. This correlates with a prior proximal submandibular duct calculus seen on CT neck from 2013. The maxilla is edentulous. The mandibular teeth that are present are intact. No mandible lesions. Both mandibular condyles are normally located. IMPRESSION: Stable large left submandibular duct calculus. Electronically Signed   By: Rudie Meyer M.D.   On: 06/20/2015 14:53     Medications:     Scheduled Medications: . ALPRAZolam  1 mg Oral TID  . amiodarone  200 mg Oral Daily  . digoxin  0.125 mg Oral Daily  . fluticasone  1 spray Each Nare Daily  . sodium chloride  3 mL Intravenous Q12H  . sodium chloride  3  mL Intravenous Q12H    Infusions: . [START ON 06/23/2015] sodium chloride      PRN Medications: sodium chloride, sodium chloride, acetaminophen, albuterol, ondansetron (ZOFRAN) IV, oxyCODONE-acetaminophen **AND** oxyCODONE, sodium chloride, sodium chloride, sodium chloride   Assessment:   1. A/C Systolic Heart Failure- Possible cardiogenic shock  2. Symptomatic iron-deficiency anemia 3. PAF - now in NSR 4. HCV 5. L arm plegia  6. Acute on CKD stage 3 - now back to baseline  Plan/Discussion:    Hgb stable on Eliquis. Await results of capsule endo.   HF also stable. For RHC today as part of VAD work-up. Titrate meds as tolerated. Renal function back to baseline. K 3.9/   Will d/w ID regarding HCV therapy.    Length of Stay: 6  Aleck Locklin. MD  06/22/2015, 10:37 AM  Advanced Heart Failure Team Pager [phone] (M-F; 7a - 4p)  Please contact [facility] for night-coverage after hours (4p -7a ) and weekends on [email or web address]

## 2015-06-22 NOTE — Progress Notes (Signed)
Patient and spouse reminded to leave urine in urinal so it can be measured.  Both verbalized their understanding.

## 2015-06-22 NOTE — Procedures (Signed)
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 Potential source of some blood loss but do not think scoping and ablating necessary at this time.  OK to anticoagulate I think.  Full report to be scanned in.

## 2015-06-22 NOTE — Progress Notes (Signed)
CSW received referral to begin LVAD assessment while patient is in hospital. CSW met with patient and wife to discuss and complete LVAD assessment. CSW completed about half the questions as patient became tired. CSW will follow up tomorrow to complete assessment. Dale [name], [place]

## 2015-06-22 NOTE — Progress Notes (Signed)
VASCULAR LAB PRELIMINARY  PRELIMINARY  PRELIMINARY  PRELIMINARY  Bilateral:  1-39% ICA stenosis.  Vertebral artery flow is antegrade.     ARTERIAL  ABI completed:    RIGHT    LEFT    PRESSURE WAVEFORM  PRESSURE WAVEFORM  BRACHIAL picc line picc line BRACHIAL 108 triphasic  DP 148 triphasic DP 145 triphasic  PT 141 triphasic PT 136 triphasic    RIGHT LEFT  ABI 1.37 1.34     Dale [name], RVT 06/22/2015, 4:04 PM    Dale [name], RVT, RDMS 06/22/2015, 3:04 PM

## 2015-06-22 NOTE — Progress Notes (Signed)
Patient ID: Dale [name], male   DOB: September 21, 1945, 70 y.o.   MRN: [id number]    Progress Note   Subjective   Pt for right heart cath today  Capsule endoscopy- completed- finding of one small proximal small bowel AVM , otherwise negative study   Objective   Vital signs in last 24 hours: Temp:  [98 F (36.7 C)-98.4 F (36.9 C)] 98 F (36.7 C) (01/18 0730) Pulse Rate:  [70-86] 76 (01/18 0310) Resp:  [14-21] 21 (01/18 0310) BP: (109-128)/(61-80) 119/67 mmHg (01/18 0730) SpO2:  [98 %-100 %] 98 % (01/18 0310) Weight:  [145 lb 11.6 oz (66.1 kg)] 145 lb 11.6 oz (66.1 kg) (01/18 0441) Last BM Date: 06/22/15 General:    Older male  in NAD Heart:  Regular rate and rhythm; no murmurs Lungs: Respirations even and unlabored, lungs CTA bilaterally Abdomen:  Soft, nontender and nondistended. Normal bowel sounds. Extremities:  Without edema. Neurologic:  Alert and oriented,  grossly normal neurologically. Psych:  Cooperative. Normal mood and affect.   Lab Results:  Recent Labs  06/20/15 1050 06/21/15 0638 06/22/15 0430  WBC 5.2 5.3 5.8  HGB 10.8* 9.9* 9.5*  HCT 36.4* 32.5* 32.0*  PLT 213 172 154       Assessment / Plan:    #1 70 yo male with Fe deficiency anemia, in setting of chronic anticoagulation- Negative EGD and Colon Capsule endoscopy completes and finding of 1 small AVM proximal small bowel which are certainly potential source for some  chronic blood loss especially in setting of anticoagulation but not thinking that explains current anemia completely or perhaps at all. Think ok to anticoagulate with this 1 subtle AVM .  #2 Afib  #3 severe CHFwith EF 15%  #4 hep C- new finding- not sure a candidate for treatment with his severe cardiomyopathy  Plan; Oral iron RX chronically Follow hgb's serially-out pt as well-transfuse as indicated  No further Gi workup We will sign off, available if needed.    LOS: 6 days   Amy Esterwood  06/22/2015, 11:15 AM    [facility]  Attending   I have taken an interval history, reviewed the chart and examined the patient. I agree with the Advanced Practitioner's note, impression and recommendations.   Iva Boop, MD, [facility] [phone] (pager) [phone] after 5 PM, weekends and holidays  06/22/2015 1:00 PM

## 2015-06-22 NOTE — H&P (View-Only) (Signed)
Advanced Heart Failure Rounding Note   Subjective:    Underwent EGD and colonoscopy 1/14. No source of bleeding. Capsule endo completed. hoepfully will get results today. No overt bleeding. Hgb stable on Eliquis.  Denies SOB, orthopnea or PND. Walking around room.    Objective:   Weight Range:  Vital Signs:   Temp:  [98 F (36.7 C)-98.4 F (36.9 C)] 98 F (36.7 C) (01/18 0730) Pulse Rate:  [70-86] 76 (01/18 0310) Resp:  [14-21] 21 (01/18 0310) BP: (109-128)/(61-80) 119/67 mmHg (01/18 0730) SpO2:  [98 %-100 %] 98 % (01/18 0310) Weight:  [66.1 kg (145 lb 11.6 oz)] 66.1 kg (145 lb 11.6 oz) (01/18 0441) Last BM Date: 06/22/15  Weight change: Filed Weights   06/21/15 0500 06/21/15 0908 06/22/15 0441  Weight: 69.355 kg (152 lb 14.4 oz) 68.947 kg (152 lb) 66.1 kg (145 lb 11.6 oz)    Intake/Output:   Intake/Output Summary (Last 24 hours) at 06/22/15 1037 Last data filed at 06/22/15 0500  Gross per 24 hour  Intake    683 ml  Output   1150 ml  Net   -467 ml     Physical Exam: General: Sitting on edge of bed. Elderly appearing. HEENT: normal Neck: supple. JVP 8 cm. Carotids 2+ bilat; no bruits. No thyromegaly or nodule noted. Cor: PMI laterally displaced. Regular rate & rhythm. No rubs, or murmurs. + S3  Lungs: clear Abdomen: soft, nontender, nondistended. No hepatosplenomegaly. No bruits or masses. Good bowel sounds. Extremities: no cyanosis, clubbing, rash, no edema LUE atrophied and plegic Neuro: alert & orientedx3,   Telemetry: Reviewed personally,  NSR 60-70s  Labs: Basic Metabolic Panel:  Recent Labs Lab 06/16/15 1944  06/17/15 0845 06/18/15 0306 06/19/15 1149 06/21/15 0638 06/22/15 0430  NA 141  < > 140 140 139 139 139  K 4.2  < > 4.1 4.4 4.3 4.2 3.9  CL 106  < > 108 110 106 107 109  CO2 26  < > 25 21* GLUCOSE 100*  < > 87 74 77 84 112*  BUN 17  < > CREATININE 1.34*  < > 1.19 1.06 1.15 1.07 1.09  CALCIUM 8.6*  < >  8.6* 8.7* 8.8* 8.8* 8.5*  MG 2.0  --   --   --   --   --   --   < > = values in this interval not displayed.  Liver Function Tests:  Recent Labs Lab 06/16/15 1944  AST 34  ALT 20  ALKPHOS 115  BILITOT 0.9  PROT 6.6  ALBUMIN 3.2*   No results for input(s): LIPASE, AMYLASE in the last 168 hours. No results for input(s): AMMONIA in the last 168 hours.  CBC:  Recent Labs Lab 06/16/15 1944 06/16/15 2245 06/17/15 0845 06/18/15 0306 06/20/15 1050 06/21/15 0638 06/22/15 0430  WBC 5.9 6.5 5.4  --  5.2 5.3 5.8  NEUTROABS 2.8 2.8  --   --   --   --   --   HGB 7.7* 7.1* 9.9* 10.2* 10.8* 9.9* 9.5*  HCT 27.0* 25.2* 33.3* 33.1* 36.4* 32.5* 32.0*  MCV 70.3* 70.2* 73.2*  --  74.4* 74.9* 75.7*  PLT 209 193 217  --  213 172 154    Cardiac Enzymes: No results for input(s): CKTOTAL, CKMB, CKMBINDEX, TROPONINI in the last 168 hours.  BNP: BNP (last 3 results)  Recent Labs  03/05/15 2334 03/19/15 1239 06/16/15 1944  BNP  823.0* 1285.0* 208.4*    ProBNP (last 3 results) No results for input(s): PROBNP in the last 8760 hours.    Other results:  Imaging: Dg Orthopantogram  06/20/2015  CLINICAL DATA:  Left submandibular gland calculus. EXAM: ORTHOPANTOGRAM/PANORAMIC COMPARISON:  Neck CT 01/07/2012 FINDINGS: There is a large, 11.5 mm, calculus in the left submandibular area. This correlates with a prior proximal submandibular duct calculus seen on CT neck from 2013. The maxilla is edentulous. The mandibular teeth that are present are intact. No mandible lesions. Both mandibular condyles are normally located. IMPRESSION: Stable large left submandibular duct calculus. Electronically Signed   By: Rudie Meyer M.D.   On: 06/20/2015 14:53     Medications:     Scheduled Medications: . ALPRAZolam  1 mg Oral TID  . amiodarone  200 mg Oral Daily  . digoxin  0.125 mg Oral Daily  . fluticasone  1 spray Each Nare Daily  . sodium chloride  3 mL Intravenous Q12H  . sodium chloride  3  mL Intravenous Q12H    Infusions: . [START ON 06/23/2015] sodium chloride      PRN Medications: sodium chloride, sodium chloride, acetaminophen, albuterol, ondansetron (ZOFRAN) IV, oxyCODONE-acetaminophen **AND** oxyCODONE, sodium chloride, sodium chloride, sodium chloride   Assessment:   1. A/C Systolic Heart Failure- Possible cardiogenic shock  2. Symptomatic iron-deficiency anemia 3. PAF - now in NSR 4. HCV 5. L arm plegia  6. Acute on CKD stage 3 - now back to baseline  Plan/Discussion:    Hgb stable on Eliquis. Await results of capsule endo.   HF also stable. For RHC today as part of VAD work-up. Titrate meds as tolerated. Renal function back to baseline. K 3.9/   Will d/w ID regarding HCV therapy.    Length of Stay: 6  Conlan Miceli. MD  06/22/2015, 10:37 AM  Advanced Heart Failure Team Pager [phone] (M-F; 7a - 4p)  Please contact [facility] for night-coverage after hours (4p -7a ) and weekends on [email or web address]

## 2015-06-22 NOTE — Interval H&P Note (Signed)
History and Physical Interval Note:  06/22/2015 11:13 AM  Dale [name]  has presented today for surgery, with the diagnosis of hf  The various methods of treatment have been discussed with the patient and family. After consideration of risks, benefits and other options for treatment, the patient has consented to  Procedure(s): Right Heart Cath (N/A) as a surgical intervention .  The patient's history has been reviewed, patient examined, no change in status, stable for surgery.  I have reviewed the patient's chart and labs.  Questions were answered to the patient's satisfaction.     Linkyn Gobin, Reuel Boom

## 2015-06-22 NOTE — Progress Notes (Signed)
VASCULAR LAB PRELIMINARY  PRELIMINARY  PRELIMINARY  PRELIMINARY   Bilateral lower extremity venous Doppler has been completed. Bilateral:  No evidence of DVT, superficial thrombosis, or Baker's Cyst.   Jenetta Loges, RVT, RDMS 06/22/2015, 2:07 PM

## 2015-06-22 NOTE — Progress Notes (Signed)
CSW met with patient an dwife at bedside to finish LVAD assessment. Patient reports he just had a xanax to relax for upcoming cardiac cath today. Patient feeling sleepy. CSW suggested completion of LVAD assessment at another time. CSW will stay in touch with patient and wife and coordinate time for completion of LVAD assessment. Raquel Sarna, [place]

## 2015-06-22 NOTE — Progress Notes (Signed)
Pt just left for test. Will f/u. Ethelda Chick CES, ACSM 1:41 PM 06/22/2015

## 2015-06-23 DIAGNOSIS — Z7189 Other specified counseling: Secondary | ICD-10-CM | POA: Insufficient documentation

## 2015-06-23 DIAGNOSIS — Z515 Encounter for palliative care: Secondary | ICD-10-CM

## 2015-06-23 DIAGNOSIS — K31819 Angiodysplasia of stomach and duodenum without bleeding: Secondary | ICD-10-CM

## 2015-06-23 LAB — BASIC METABOLIC PANEL
ANION GAP: 7 (ref 5–15)
BUN: 17 mg/dL (ref 6–20)
CALCIUM: 8.9 mg/dL (ref 8.9–10.3)
CHLORIDE: 110 mmol/L (ref 101–111)
CO2: 25 mmol/L (ref 22–32)
Creatinine, Ser: 1.37 mg/dL — ABNORMAL HIGH (ref 0.61–1.24)
GFR calc Af Amer: 59 mL/min — ABNORMAL LOW (ref >60)
GFR calc non Af Amer: 51 mL/min — ABNORMAL LOW (ref >60)
GLUCOSE: 136 mg/dL — AB (ref 65–99)
Potassium: 4.5 mmol/L (ref 3.5–5.1)
Sodium: 142 mmol/L (ref 135–145)

## 2015-06-23 LAB — CBC
HEMATOCRIT: 32.3 % — AB (ref 39.0–52.0)
HEMOGLOBIN: 9.6 g/dL — AB (ref 13.0–17.0)
MCH: 22.7 pg — AB (ref 26.0–34.0)
MCHC: 29.7 g/dL — AB (ref 30.0–36.0)
MCV: 76.5 fL — AB (ref 78.0–100.0)
Platelets: 136 10*3/uL — ABNORMAL LOW (ref 150–400)
RBC: 4.22 MIL/uL (ref 4.22–5.81)
RDW: 24 % — ABNORMAL HIGH (ref 11.5–15.5)
WBC: 5 10*3/uL (ref 4.0–10.5)

## 2015-06-23 LAB — CARBOXYHEMOGLOBIN
Carboxyhemoglobin: 2.2 % — ABNORMAL HIGH (ref 0.5–1.5)
Methemoglobin: 0.5 % (ref 0.0–1.5)
O2 Saturation: 84.5 %
TOTAL HEMOGLOBIN: 9.7 g/dL — AB (ref 13.5–18.0)
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
## 2015-06-23 NOTE — Progress Notes (Signed)
Advanced Heart Failure Rounding Note   Subjective:    Underwent EGD and colonoscopy 1/14. No source of bleeding. Capsule endo completed - reviewed 06/22/15 - one small proximal small bowel AVM.  No overt bleeding. Hgb stable on Eliquis. Denies SOB, orthopnea or PND. Walking around room.  Creatinine bumped 1.09 => 1.37  RHC 06/22/15 RA = 1 RV = 28/0/2 PA = 28/6 (15) PCW = 6 Fick cardiac output/index = 5.6/3.1 Thermo CO/CI = 5.8/3.2 PVR = 1.6 WU Ao sat = 98% PA sat = 64%, 65%  Objective:   Weight Range:  Vital Signs:   Temp:  [97.4 F (36.3 C)-98.1 F (36.7 C)] 97.8 F (36.6 C) (01/19 0714) Pulse Rate:  [0-81] 76 (01/19 0310) Resp:  [0-20] 16 (01/19 0310) BP: (100-130)/(60-81) 113/66 mmHg (01/19 0310) SpO2:  [0 %-99 %] 97 % (01/19 0310) Weight:  [140 lb 3.2 oz (63.594 kg)-142 lb 3.2 oz (64.5 kg)] 140 lb 3.2 oz (63.594 kg) (01/19 0800) Last BM Date: 06/22/15  Weight change: Filed Weights   06/22/15 0441 06/23/15 0300 06/23/15 0800  Weight: 145 lb 11.6 oz (66.1 kg) 142 lb 3.2 oz (64.5 kg) 140 lb 3.2 oz (63.594 kg)    Intake/Output:   Intake/Output Summary (Last 24 hours) at 06/23/15 0915 Last data filed at 06/23/15 0400  Gross per 24 hour  Intake    493 ml  Output    850 ml  Net   -357 ml     Physical Exam: General: Sitting on edge of bed. Elderly appearing. HEENT: normal Neck: supple. JVP 7-8 cm. Carotids 2+ bilat; no bruits. No thyromegaly or lymphadenopathy. noted. Cor: PMI laterally displaced. RRR No rubs, or murmurs. + S3  Lungs: CTAB, normal effort Abdomen: soft, NT, ND, no HSM. No bruits or masses. +BS  Extremities: no cyanosis, clubbing, rash, no edema LUE atrophied and plegic Neuro: alert & orientedx3,   Telemetry: Reviewed personally,  NSR 60-70s  Labs: Basic Metabolic Panel:  Recent Labs Lab 06/16/15 1944  06/18/15 0306 06/19/15 1149 06/21/15 0638 06/22/15 0430 06/23/15 0336  NA 141  < > 140 139 139 139 142  K 4.2  < > 4.4 4.3  4.2 3.9 4.5  CL 106  < > 110 106 107 109 110  CO2 26  < > 21* 25 25 26 25   GLUCOSE 100*  < > 74 77 84 112* 136*  BUN 17  < > 10 11 9 11 17   CREATININE 1.34*  < > 1.06 1.15 1.07 1.09 1.37*  CALCIUM 8.6*  < > 8.7* 8.8* 8.8* 8.5* 8.9  MG 2.0  --   --   --   --   --   --   < > = values in this interval not displayed.  Liver Function Tests:  Recent Labs Lab 06/16/15 1944  AST 34  ALT 20  ALKPHOS 115  BILITOT 0.9  PROT 6.6  ALBUMIN 3.2*   No results for input(s): LIPASE, AMYLASE in the last 168 hours. No results for input(s): AMMONIA in the last 168 hours.  CBC:  Recent Labs Lab 06/16/15 1944 06/16/15 2245 06/17/15 0845 06/18/15 0306 06/20/15 1050 06/21/15 0638 06/22/15 0430 06/23/15 0336  WBC 5.9 6.5 5.4  --  5.2 5.3 5.8 5.0  NEUTROABS 2.8 2.8  --   --   --   --   --   --   HGB 7.7* 7.1* 9.9* 10.2* 10.8* 9.9* 9.5* 9.6*  HCT 27.0* 25.2* 33.3* 33.1* 36.4*  32.5* 32.0* 32.3*  MCV 70.3* 70.2* 73.2*  --  74.4* 74.9* 75.7* 76.5*  PLT 209 193 217  --  213 172 154 136*    Cardiac Enzymes: No results for input(s): CKTOTAL, CKMB, CKMBINDEX, TROPONINI in the last 168 hours.  BNP: BNP (last 3 results)  Recent Labs  03/05/15 2334 03/19/15 1239 06/16/15 1944  BNP 823.0* 1285.0* 208.4*    ProBNP (last 3 results) No results for input(s): PROBNP in the last 8760 hours.    Other results:  Imaging: No results found.   Medications:     Scheduled Medications: . ALPRAZolam  1 mg Oral TID  . amiodarone  200 mg Oral Daily  . apixaban  5 mg Oral BID  . digoxin  0.125 mg Oral Daily  . fluticasone  1 spray Each Nare Daily  . sodium chloride  3 mL Intravenous Q12H  . sodium chloride  3 mL Intravenous Q12H  . sodium chloride  3 mL Intravenous Q12H    Infusions:    PRN Medications: sodium chloride, sodium chloride, sodium chloride, acetaminophen, albuterol, ondansetron (ZOFRAN) IV, oxyCODONE-acetaminophen **AND** oxyCODONE, sodium chloride, sodium chloride, sodium  chloride, sodium chloride   Assessment:   1. A/C Systolic Heart Failure- Possible cardiogenic shock  2. Symptomatic iron-deficiency anemia 3. PAF - now in NSR 4. HCV 5. L arm plegia  6. Acute on CKD stage 3 - now back to baseline  Plan/Discussion:    Hgb stable on Eliquis. Capsule endo with one small bowel AVM, otherwise unremarkable.   RHC 06/22/15 with low filling pressures and normal CO.   Weight shows down 5 lbs. Out 0.7 L x 24 hrs. Titrate meds as tolerated. Creatinine bump slightly 1.09 => 1.3. K 4.5  Undergoing VAD workup.  Needs outpatient CPX. Will see if any further tests to be done while inpatient.    Dr. Gala Romney to discuss with ID regarding HCV therapy.   Length of Stay: [address]  Graciella Freer, [place] 06/23/2015, 9:15 AM  Advanced Heart Failure Team Pager [phone] (M-F; 7a - 4p)  Please contact [facility] for night-coverage after hours (4p -7a ) and weekends on [email or web address]   Patient seen and examined with Otilio Saber, PA-C. We discussed all aspects of the encounter. I agree with the assessment and plan as stated above.   Results of RHC reviewed with him. He is doing well. Volume status well controlled off lasix. Creatinine up slightly. BP soft. Unable to tolerate b-blocker or ACE/ARB due to low BP. Will start bidil 1/2 tab tid and see if he tolerates. Can use lasix prn. Doesn't meet VAD criteria currently. Will need CPX.   Hgb stable on Eliquis. Capsule endo with one small AVM (non-bleeding). Has received IV iron. Coontinue current therapy.   Can go to tele today. Continue CR.   Bensimhon, Daniel,MD 1:51 PM

## 2015-06-23 NOTE — Care Management Note (Addendum)
Case Management Note  Patient Details  Name: Farzan Salopek MRN: [id number] Date of Birth: 27-Mar-1946  Subjective/Objective:    NCM spoke with patient and spouse regarding [facility] for CHF Management,  Wife chose [facility], referral made to [facility], for [facility].  Soc will begin 24-48 hrs post discharge.                  Action/Plan:   Expected Discharge Date:                  Expected Discharge Plan:  Home w Home Health Services  In-House Referral:     Discharge planning Services  CM Consult  Post Acute Care Choice:    Choice offered to:  Patient, Spouse  DME Arranged:    DME Agency:     HH Arranged:  RN HH Agency:  [facility]  Status of Service:  Completed, signed off  Medicare Important Message Given:  Yes Date Medicare IM Given:    Medicare IM give by:    Date Additional Medicare IM Given:    Additional Medicare Important Message give by:     If discussed at Long Length of Stay Meetings, dates discussed:    Additional Comments:  Leone Haven, RN 06/23/2015, 10:34 AM

## 2015-06-23 NOTE — Progress Notes (Addendum)
  Benefit Check      Per rep at [facility]:   Bidil: 30 day retail/ $47.00/ no auth required/ patient can use any retail pharmacy   90 day mail order/ $131.00/ no auth required/ patient can use any retail pharmacy    NCM gave patient a 30 day savings card and a copay savings card.  MD will need to give patient a script to go with 30 day card.    NCM verified with patient's pharmacy and they do have it in stock. [facility] in [place] [phone].

## 2015-06-23 NOTE — Progress Notes (Signed)
Responded to page from unit nurse to assist patient in completing Advance Directives. Document completed and two copies and the original were given to patient and one copy to nurse for patient's chart.    06/23/15 1100  Clinical Encounter Type  Visited With Patient and family together;Health care provider  Visit Type Initial;Spiritual support  Referral From Nurse  Spiritual Encounters  Spiritual Needs Literature;Emotional  Stress Factors  Patient Stress Factors None identified  Family Stress Factors None identified  Advance Directives (For Healthcare)  Does patient have an advance directive? Yes  Copy of advanced directive(s) in chart? Yes  Venida Jarvis, Chaplain,pager [phone]

## 2015-06-24 ENCOUNTER — Other Ambulatory Visit (HOSPITAL_COMMUNITY): Payer: Medicare HMO

## 2015-06-24 LAB — BASIC METABOLIC PANEL
Anion gap: 6 (ref 5–15)
BUN: 13 mg/dL (ref 6–20)
CHLORIDE: 106 mmol/L (ref 101–111)
CO2: 27 mmol/L (ref 22–32)
Calcium: 8.6 mg/dL — ABNORMAL LOW (ref 8.9–10.3)
Creatinine, Ser: 1.01 mg/dL (ref 0.61–1.24)
GFR calc Af Amer: >60 mL/min (ref >60)
GFR calc non Af Amer: >60 mL/min (ref >60)
Glucose, Bld: 89 mg/dL (ref 65–99)
POTASSIUM: 4 mmol/L (ref 3.5–5.1)
SODIUM: 139 mmol/L (ref 135–145)

## 2015-06-24 LAB — CARBOXYHEMOGLOBIN
CARBOXYHEMOGLOBIN: 2.3 % — AB (ref 0.5–1.5)
METHEMOGLOBIN: 0.6 % (ref 0.0–1.5)
O2 Saturation: 83.1 %
Total hemoglobin: 9.5 g/dL — ABNORMAL LOW (ref 13.5–18.0)

## 2015-06-24 LAB — CBC
HEMATOCRIT: 32.9 % — AB (ref 39.0–52.0)
Hemoglobin: 9.4 g/dL — ABNORMAL LOW (ref 13.0–17.0)
MCH: 22.5 pg — ABNORMAL LOW (ref 26.0–34.0)
MCHC: 28.6 g/dL — ABNORMAL LOW (ref 30.0–36.0)
MCV: 78.7 fL (ref 78.0–100.0)
Platelets: 140 10*3/uL — ABNORMAL LOW (ref 150–400)
RBC: 4.18 MIL/uL — AB (ref 4.22–5.81)
RDW: 25.1 % — AB (ref 11.5–15.5)
WBC: 5.9 10*3/uL (ref 4.0–10.5)

## 2015-06-24 LAB — HEPATITIS A ANTIBODY, TOTAL: Hep A Total Ab: POSITIVE — AB

## 2015-06-24 LAB — FACTOR 5 LEIDEN

## 2015-06-24 LAB — HEPATITIS B CORE ANTIBODY, TOTAL: HEP B C TOTAL AB: POSITIVE — AB

## 2015-06-24 LAB — HEPATITIS B SURFACE ANTIGEN: HEP B S AG: NEGATIVE

## 2015-06-24 MED ORDER — SPIRONOLACTONE 25 MG PO TABS: 12.5000 mg | ORAL_TABLET | Freq: Every day | ORAL | Status: DC

## 2015-06-24 MED ORDER — ISOSORB DINITRATE-HYDRALAZINE 20-37.5 MG PO TABS: 0.5000 | ORAL_TABLET | Freq: Three times a day (TID) | ORAL | Status: DC

## 2015-06-24 MED ORDER — ISOSORB DINITRATE-HYDRALAZINE 20-37.5 MG PO TABS
0.5000 | ORAL_TABLET | Freq: Three times a day (TID) | ORAL | Status: DC
  Administered 2015-06-24: 0.5 via ORAL
  Filled 2015-06-24: qty 1

## 2015-06-24 MED ORDER — ENSURE ENLIVE PO LIQD
237.0000 mL | Freq: Two times a day (BID) | ORAL | Status: DC
  Administered 2015-06-24: 237 mL via ORAL

## 2015-06-24 NOTE — Care Management Important Message (Signed)
Important Message  Patient Details  Name: Dale [name] MRN: [id number] Date of Birth: [birth year]   Medicare Important Message Given:  Yes    Marye Eagen P Gorge Almanza 06/24/2015, 11:28 AM

## 2015-06-24 NOTE — Progress Notes (Signed)
CARDIAC REHAB PHASE I   Pt has been ambulating independently with no complaints. Completed heart failure education with pt and wife at bedside. Reviewed CHF booklet and zone tool, daily weights, sodium and fluid restrictions, heart healthy diet, exercise guidelines, activity progression, and phase 2 cardiac rehab. Pt and wife verbalized understanding, asked appropriate questions, able to perform teach back, receptive to education. Pt agreeable to phase 2 referral, will send to [facility] per pt request. Pt in bed, call bell within reach, awaiting discharge.                                              [phone] Joylene Grapes, RN, BSN 06/24/2015 11:29 AM

## 2015-06-24 NOTE — Care Management Note (Signed)
Case Management Note  Patient Details  Name: Dale [name] MRN: [id number] Date of Birth: 03-17-1946  Subjective/Objective:      Patient is for dc today, has HHRN with [facility] for [facility] Management for CHF.  Has savings card for Bidil and his pharmacy has the medication in stock.               Action/Plan:   Expected Discharge Date:                  Expected Discharge Plan:  Home w Home Health Services  In-House Referral:     Discharge planning Services  CM Consult  Post Acute Care Choice:    Choice offered to:  Patient, Spouse  DME Arranged:    DME Agency:     HH Arranged:  RN HH Agency:  [facility]  Status of Service:  Completed, signed off  Medicare Important Message Given:  Yes Date Medicare IM Given:    Medicare IM give by:    Date Additional Medicare IM Given:    Additional Medicare Important Message give by:     If discussed at Long Length of Stay Meetings, dates discussed:    Additional Comments:  Leone Haven, RN 06/24/2015, 11:52 AM

## 2015-06-24 NOTE — Progress Notes (Signed)
Advanced Heart Failure Rounding Note   Subjective:    Underwent EGD and colonoscopy 1/14. No source of bleeding. Capsule endo completed - reviewed 06/22/15 - one small proximal small bowel AVM.  Continues to walk around halls without difficulty.  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  Pt is worried about fulfilling his work duties as well. Regularly walks up to 15000 steps a day, climbs ladders, and lifts heavy bags over his head. (works maintenance at [employer]). Creatinine improved. Hgb stable in 9s  RHC 06/22/15 RA = 1 RV = 28/0/2 PA = 28/6 (15) PCW = 6 Fick cardiac output/index = 5.6/3.1 Thermo CO/CI = 5.8/3.2 PVR = 1.6 WU Ao sat = 98% PA sat = 64%, 65%  Objective:   Weight Range:  Vital Signs:   Temp:  [97.9 F (36.6 C)-98.1 F (36.7 C)] 97.9 F (36.6 C) (01/20 0530) Pulse Rate:  [55-66] 58 (01/20 0530) Resp:  [14-20] 20 (01/20 0530) BP: (98-127)/(45-74) 127/65 mmHg (01/20 0530) SpO2:  [93 %-100 %] 97 % (01/20 0530) Weight:  [142 lb 13.7 oz (64.8 kg)-143 lb 11.8 oz (65.2 kg)] 142 lb 13.7 oz (64.8 kg) (01/20 0530) Last BM Date: 06/22/15  Weight change: Filed Weights   06/23/15 0800 06/23/15 2217 06/24/15 0530  Weight: 140 lb 3.2 oz (63.594 kg) 143 lb 11.8 oz (65.2 kg) 142 lb 13.7 oz (64.8 kg)    Intake/Output:   Intake/Output Summary (Last 24 hours) at 06/24/15 0938 Last data filed at 06/24/15 0640  Gross per 24 hour  Intake    240 ml  Output    650 ml  Net   -410 ml     Physical Exam: General: Elderly appearing, NAD. HEENT: normal Neck: supple. JVP ~7-8 cm. Carotids 2+ bilat; no bruits. No thyromegaly or nodule noted. Cor: PMI laterally displaced. RRR No rubs, or murmurs. + S3  Lungs: Clear, no resp distress. Abdomen: soft, non-tender, non-distended, no HSM. No bruits or masses. +BS  Extremities: no cyanosis, clubbing, rash, edema. LUE atrophied and plegic Neuro: alert & orientedx3,   Telemetry: Reviewed personally,  NSR  60-70s  Labs: Basic Metabolic Panel:  Recent Labs Lab 06/19/15 1149 06/21/15 0638 06/22/15 0430 06/23/15 0336 06/24/15 0438  NA 139 139 139 142 139  K 4.3 4.2 3.9 4.5 4.0  CL 106 107 109 110 106  CO2 25 25 26 25 27   GLUCOSE 77 84 112* 136* 89  BUN 11 9 11 17 13   CREATININE 1.15 1.07 1.09 1.37* 1.01  CALCIUM 8.8* 8.8* 8.5* 8.9 8.6*    Liver Function Tests: No results for input(s): AST, ALT, ALKPHOS, BILITOT, PROT, ALBUMIN in the last 168 hours. No results for input(s): LIPASE, AMYLASE in the last 168 hours. No results for input(s): AMMONIA in the last 168 hours.  CBC:  Recent Labs Lab 06/20/15 1050 06/21/15 0638 06/22/15 0430 06/23/15 0336 06/24/15 0438  WBC 5.2 5.3 5.8 5.0 5.9  HGB 10.8* 9.9* 9.5* 9.6* 9.4*  HCT 36.4* 32.5* 32.0* 32.3* 32.9*  MCV 74.4* 74.9* 75.7* 76.5* 78.7  PLT 213 172 154 136* 140*    Cardiac Enzymes: No results for input(s): CKTOTAL, CKMB, CKMBINDEX, TROPONINI in the last 168 hours.  BNP: BNP (last 3 results)  Recent Labs  03/05/15 2334 03/19/15 1239 06/16/15 1944  BNP 823.0* 1285.0* 208.4*    ProBNP (last 3 results) No results for input(s): PROBNP in the last 8760 hours.    Other results:  Imaging: No results  found.   Medications:     Scheduled Medications: . ALPRAZolam  1 mg Oral TID  . amiodarone  200 mg Oral Daily  . apixaban  5 mg Oral BID  . digoxin  0.125 mg Oral Daily  . feeding supplement (ENSURE ENLIVE)  237 mL Oral BID BM  . fluticasone  1 spray Each Nare Daily  . sodium chloride  3 mL Intravenous Q12H  . sodium chloride  3 mL Intravenous Q12H  . sodium chloride  3 mL Intravenous Q12H    Infusions:    PRN Medications: sodium chloride, sodium chloride, sodium chloride, acetaminophen, albuterol, ondansetron (ZOFRAN) IV, oxyCODONE-acetaminophen **AND** oxyCODONE, sodium chloride, sodium chloride, sodium chloride, sodium chloride   Assessment:   1. A/C Systolic Heart Failure- Possible cardiogenic  shock  2. Symptomatic iron-deficiency anemia 3. PAF - now in NSR 4. HCV 5. L arm plegia  6. Acute on CKD stage 3 - now back to baseline  Plan/Discussion:    Hgb remains stable. Continue Eliquis. Capsule endo with one small bowel AVM, otherwise unremarkable.   RHC 06/22/15 with low filling pressures and normal CO.   Weight shows down 1 lbs. Out ~0.5 L x 24 hrs. Will add on Bidil 0.5 tab TID. If becomes hypotensive may be able to drop back to BID. Creatinine improved.   Undergoing VAD workup.  Needs outpatient CPX. Has cMRI scheduled 07/04/15. Will need close follow up in HF clinic.   Pt to undergo HCV therapy as outpatient.   Length of Stay: [address]  Luane School 06/24/2015, 9:38 AM  Advanced Heart Failure Team Pager [phone] (M-F; 7a - 4p)  Please contact [facility] for night-coverage after hours (4p -7a ) and weekends on [email or web address]  Patient seen and examined with Otilio Saber, PA-C. We discussed all aspects of the encounter. I agree with the assessment and plan as stated above.   Doing well. Can go home today. Unable to tolerate b-blocker and ACE/ARB as outpatient. Will try low dose Bidil. Use spiro 12.5 daily for a diuretic. Make lasix only prn. Close f/u in HF clinic with CPX in near future.   Discussed with ID. Will arrange outpatient therapy for HCV treatment with Dr. Luciana Axe.   Bensimhon, Daniel,MD 10:39 AM
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
## 2015-06-24 NOTE — Discharge Summary (Signed)
Pt got discharged to home, discharge instructions provided and patient showed understanding to it, IV taken out,Telemonitor DC,pt left unit in wheelchair with all of the belongings accompanied with a family member (wife) 

## 2015-06-25 ENCOUNTER — Telehealth: Payer: Self-pay | Admitting: Physician Assistant

## 2015-06-25 MED ORDER — HYDRALAZINE HCL 25 MG PO TABS: 25.0000 mg | ORAL_TABLET | Freq: Three times a day (TID) | ORAL | Status: DC

## 2015-06-25 NOTE — Telephone Encounter (Addendum)
Patient's wife called weekend answering service. Patient was recently started on Bidil - he was warned this could cause headache and it most certainly has. Otherwise no reported complaints or symptoms. He is not willing to continue this medicine due to headache - was wondering about alternative. Was sent home on Bidil 1/2 tablet TID. I asked him to stop taking Bidil and instead start hydralazine 25mg  TID. Rx sent in to preferred pharmacy. Advised he may try Tylenol as directed for pain. They verbalized understanding and gratitude.  His wife also inquired about refill on Xanax. This is typically dispensed by PCP. I asked they f/u for PCP for further refills.   Deetya Drouillard PA-C

## 2015-06-27 ENCOUNTER — Encounter: Payer: Self-pay | Admitting: Internal Medicine

## 2015-06-27 ENCOUNTER — Telehealth (HOSPITAL_COMMUNITY): Payer: Self-pay | Admitting: Vascular Surgery

## 2015-06-27 ENCOUNTER — Telehealth (HOSPITAL_COMMUNITY): Payer: Self-pay | Admitting: Cardiology

## 2015-06-27 NOTE — Telephone Encounter (Signed)
Unable to give verbal order for cbc to be drawn with [facility] as I was with a patinent however patient was advised to have CBC drawn a PCP aapoint

## 2015-06-27 NOTE — Telephone Encounter (Signed)
Patients wife called with concerns regarding dark stool this morning Patient had already called [facility], nurse to come out today. Also spoke with pcp for work in appointment this afternoon  Per Joanell Rising Pt should keep work in with PCP [facility] to draw CBC and we can go from there as  Pt underwent EGD and Colonoscopy 06/18/15 with no source of bleeding and planned for capsule endo. Eliquis restarted and HGb remained stable. Capsule performed 06/21/15. 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  Pts wife aware and voiced understanding  Unable to reach [facility] nurse unable to get orders for labs during visit, advised to have drawn at PCP office

## 2015-06-27 NOTE — Telephone Encounter (Signed)
Nurse from [facility] called she is at pt home she is trying to clarify if pt needs CBC. And also needs order for Physical therapy

## 2015-06-29 NOTE — Telephone Encounter (Signed)
[facility] occupational therapist called to inform Dr.Bensimhon pt was still having black stool. Per phone note on 1/23 and chantel pt should have kept appointment with PCP on 1/24 and have cbc. This problem is already being addressed and should be followed by PCP. Called pt to inform them but no answer

## 2015-06-30 ENCOUNTER — Telehealth (HOSPITAL_COMMUNITY): Payer: Self-pay | Admitting: Cardiology

## 2015-06-30 NOTE — Telephone Encounter (Signed)
Patients wife called again regarding dark stools, increased SOB, increased confusion, decreased appetite , and abdominal pain Patient reports he is not feeling well, would like to be seen just not in the ER, and increased weakness Patient did not have CBC done while at the PCP office 1/25  Michelle,RN with [name] at home during time of call for weekly CHF management/labs -after assessment she did not feel like above symptoms are related to a CHFexacerbation   felt more related to anemia? CBC drawn in addition to ordered labs (verbal order given to run CBC) -pt did report to [place] he would like to come in  Per Christia Reading Patient should report to ER for further evaluation  Patient states he does not wish to come into ER, labs sent STAT and will advise further at this time

## 2015-06-30 NOTE — Telephone Encounter (Signed)
Pt needs to stop Eliquis until he can be worked up per Dr. Gala Romney.   Attempted call x 2 left voicemail to call back.   Casimiro Needle [address]" [place], PA-C 06/30/2015 4:20 PM

## 2015-06-30 NOTE — Telephone Encounter (Signed)
Pt wife was returning call from the office.  Told her to hold the Eliquis. She understands. States he is weak and has DOE.  Labs drawn by [facility], results not available.  Advised Ms Saile, that since he is SOB, I recommend he be evaluated at the ER. Jeani Hawking is fine, they will call us if we are needed. She stated she would tell him.  Theodore Demark, Cordelia Poche 06/30/2015 8:05 PM Beeper [phone]

## 2015-07-01 ENCOUNTER — Encounter (HOSPITAL_COMMUNITY): Payer: Self-pay | Admitting: *Deleted

## 2015-07-01 ENCOUNTER — Telehealth (HOSPITAL_COMMUNITY): Payer: Self-pay | Admitting: *Deleted

## 2015-07-01 ENCOUNTER — Emergency Department (HOSPITAL_COMMUNITY)
Admission: EM | Admit: 2015-07-01 | Discharge: 2015-07-01 | Disposition: A | Discharge status: Home / Self Care | Payer: Medicare HMO | Attending: Emergency Medicine | Admitting: Emergency Medicine

## 2015-07-01 ENCOUNTER — Emergency Department (HOSPITAL_COMMUNITY): Payer: Medicare HMO

## 2015-07-01 DIAGNOSIS — I5043 Acute on chronic combined systolic (congestive) and diastolic (congestive) heart failure: Secondary | ICD-10-CM | POA: Diagnosis not present

## 2015-07-01 DIAGNOSIS — Z79899 Other long term (current) drug therapy: Secondary | ICD-10-CM | POA: Insufficient documentation

## 2015-07-01 DIAGNOSIS — I4891 Unspecified atrial fibrillation: Secondary | ICD-10-CM | POA: Diagnosis not present

## 2015-07-01 DIAGNOSIS — K5900 Constipation, unspecified: Secondary | ICD-10-CM | POA: Diagnosis not present

## 2015-07-01 DIAGNOSIS — R0602 Shortness of breath: Secondary | ICD-10-CM | POA: Insufficient documentation

## 2015-07-01 DIAGNOSIS — R1013 Epigastric pain: Secondary | ICD-10-CM | POA: Insufficient documentation

## 2015-07-01 DIAGNOSIS — R195 Other fecal abnormalities: Secondary | ICD-10-CM | POA: Insufficient documentation

## 2015-07-01 DIAGNOSIS — R0600 Dyspnea, unspecified: Secondary | ICD-10-CM | POA: Diagnosis not present

## 2015-07-01 DIAGNOSIS — M1990 Unspecified osteoarthritis, unspecified site: Secondary | ICD-10-CM | POA: Diagnosis not present

## 2015-07-01 DIAGNOSIS — Z9889 Other specified postprocedural states: Secondary | ICD-10-CM | POA: Diagnosis not present

## 2015-07-01 DIAGNOSIS — Z87891 Personal history of nicotine dependence: Secondary | ICD-10-CM | POA: Diagnosis not present

## 2015-07-01 DIAGNOSIS — I10 Essential (primary) hypertension: Secondary | ICD-10-CM | POA: Diagnosis not present

## 2015-07-01 DIAGNOSIS — Z87828 Personal history of other (healed) physical injury and trauma: Secondary | ICD-10-CM | POA: Insufficient documentation

## 2015-07-01 DIAGNOSIS — R5383 Other fatigue: Secondary | ICD-10-CM | POA: Diagnosis not present

## 2015-07-01 DIAGNOSIS — R079 Chest pain, unspecified: Secondary | ICD-10-CM | POA: Diagnosis not present

## 2015-07-01 DIAGNOSIS — Z7902 Long term (current) use of antithrombotics/antiplatelets: Secondary | ICD-10-CM | POA: Insufficient documentation

## 2015-07-01 LAB — DIGOXIN LEVEL: DIGOXIN LVL: 1.6 ng/mL (ref 0.8–2.0)

## 2015-07-01 LAB — BASIC METABOLIC PANEL
Anion gap: 10 (ref 5–15)
BUN: 24 mg/dL — AB (ref 6–20)
CALCIUM: 8.9 mg/dL (ref 8.9–10.3)
CHLORIDE: 108 mmol/L (ref 101–111)
CO2: 23 mmol/L (ref 22–32)
CREATININE: 1.17 mg/dL (ref 0.61–1.24)
GFR calc non Af Amer: >60 mL/min (ref >60)
Glucose, Bld: 121 mg/dL — ABNORMAL HIGH (ref 65–99)
Potassium: 4.1 mmol/L (ref 3.5–5.1)
SODIUM: 141 mmol/L (ref 135–145)

## 2015-07-01 LAB — I-STAT TROPONIN, ED: TROPONIN I, POC: 0.02 ng/mL (ref 0.00–0.08)

## 2015-07-01 LAB — CBC
HCT: 34.6 % — ABNORMAL LOW (ref 39.0–52.0)
Hemoglobin: 10.3 g/dL — ABNORMAL LOW (ref 13.0–17.0)
MCH: 24 pg — AB (ref 26.0–34.0)
MCHC: 29.8 g/dL — ABNORMAL LOW (ref 30.0–36.0)
MCV: 80.7 fL (ref 78.0–100.0)
PLATELETS: 213 10*3/uL (ref 150–400)
RBC: 4.29 MIL/uL (ref 4.22–5.81)
RDW: 28.8 % — AB (ref 11.5–15.5)
WBC: 5.3 10*3/uL (ref 4.0–10.5)

## 2015-07-01 LAB — HEPATIC FUNCTION PANEL
ALT: 26 U/L (ref 17–63)
AST: 39 U/L (ref 15–41)
Albumin: 3.6 g/dL (ref 3.5–5.0)
Alkaline Phosphatase: 105 U/L (ref 38–126)
BILIRUBIN DIRECT: 0.2 mg/dL (ref 0.1–0.5)
BILIRUBIN INDIRECT: 0.9 mg/dL (ref 0.3–0.9)
BILIRUBIN TOTAL: 1.1 mg/dL (ref 0.3–1.2)
Total Protein: 7.4 g/dL (ref 6.5–8.1)

## 2015-07-01 LAB — POC OCCULT BLOOD, ED: FECAL OCCULT BLD: POSITIVE — AB

## 2015-07-01 LAB — LIPASE, BLOOD: Lipase: 26 U/L (ref 11–51)

## 2015-07-01 MED ORDER — DIGOXIN 125 MCG PO TABS: 0.0625 mg | ORAL_TABLET | Freq: Every day | ORAL | Status: DC

## 2015-07-01 MED ORDER — OMEPRAZOLE 20 MG PO CPDR: 20.0000 mg | DELAYED_RELEASE_CAPSULE | Freq: Every day | ORAL | Status: DC

## 2015-07-01 NOTE — Telephone Encounter (Signed)
Per Tonye Becket, NP pt was in ER and dig level 1.6, she would like pt to decrease Dig to 1/2 tab daily, attempted to call pt and got VM, left detailed mess to cut dig in half and c/b for questions

## 2015-07-01 NOTE — Discharge Instructions (Signed)
Shortness of Breath Shortness of breath means you have trouble breathing. It could also mean that you have a medical problem. You should get immediate medical care for shortness of breath. CAUSES   Not enough oxygen in the air such as with high altitudes or a smoke-filled room.  Certain lung diseases, infections, or problems.  Heart disease or conditions, such as angina or heart failure.  Low red blood cells (anemia).  Poor physical fitness, which can cause shortness of breath when you exercise.  Chest or back injuries or stiffness.  Being overweight.  Smoking.  Anxiety, which can make you feel like you are not getting enough air. DIAGNOSIS  Serious medical problems can often be found during your physical exam. Tests may also be done to determine why you are having shortness of breath. Tests may include:  Chest X-rays.  Lung function tests.  Blood tests.  An electrocardiogram (ECG).  An ambulatory electrocardiogram. An ambulatory ECG records your heartbeat patterns over a 24-hour period.  Exercise testing.  A transthoracic echocardiogram (TTE). During echocardiography, sound waves are used to evaluate how blood flows through your heart.  A transesophageal echocardiogram (TEE).  Imaging scans. Your health care provider may not be able to find a cause for your shortness of breath after your exam. In this case, it is important to have a follow-up exam with your health care provider as directed.  TREATMENT  Treatment for shortness of breath depends on the cause of your symptoms and can vary greatly. HOME CARE INSTRUCTIONS   Do not smoke. Smoking is a common cause of shortness of breath. If you smoke, ask for help to quit.  Avoid being around chemicals or things that may bother your breathing, such as paint fumes and dust.  Rest as needed. Slowly resume your usual activities.  If medicines were prescribed, take them as directed for the full length of time directed. This  includes oxygen and any inhaled medicines.  Keep all follow-up appointments as directed by your health care provider. SEEK MEDICAL CARE IF:   Your condition does not improve in the time expected.  You have a hard time doing your normal activities even with rest.  You have any new symptoms. SEEK IMMEDIATE MEDICAL CARE IF:   Your shortness of breath gets worse.  You feel light-headed, faint, or develop a cough not controlled with medicines.  You start coughing up blood.  You have pain with breathing.  You have chest pain or pain in your arms, shoulders, or abdomen.  You have a fever.  You are unable to walk up stairs or exercise the way you normally do. MAKE SURE YOU:  Understand these instructions.  Will watch your condition.  Will get help right away if you are not doing well or get worse.   This information is not intended to replace advice given to you by your health care provider. Make sure you discuss any questions you have with your health care provider.   Document Released: 02/13/2001 Document Revised: 05/26/2013 Document Reviewed: 08/06/2011 Elsevier Interactive Patient Education 2016 [employer]. Instructions for Gastrointestinal Bleeding Gastrointestinal (GI) bleeding means there is bleeding somewhere along the digestive tract, between the mouth and anus. CAUSES  There are many different problems that can cause GI bleeding. Possible causes include:  Esophagitis. This is inflammation, irritation, or swelling of the esophagus.  Hemorrhoids.These are veins that are full of blood (engorged) in the rectum. They cause pain, inflammation, and may bleed.  Anal fissures.These are areas of  painful tearing which may bleed. They are often caused by passing hard stool.  Diverticulosis.These are pouches that form on the colon over time, with age, and may bleed significantly.  Diverticulitis.This is inflammation in areas with diverticulosis. It can cause pain, fever,  and bloody stools, although bleeding is rare.  Polyps and cancer. Colon cancer often starts out as precancerous polyps.  Gastritis and ulcers.Bleeding from the upper gastrointestinal tract (near the stomach) may travel through the intestines and produce black, sometimes tarry, often bad smelling stools. In certain cases, if the bleeding is fast enough, the stools may not be black, but red. This condition may be life-threatening. SYMPTOMS   Vomiting bright red blood or material that looks like coffee grounds.  Bloody, black, or tarry stools. DIAGNOSIS  Your caregiver may diagnose your condition by taking your history and performing a physical exam. More tests may be needed, including:  X-rays and other imaging tests.  Esophagogastroduodenoscopy (EGD). This test uses a flexible, lighted tube to look at your esophagus, stomach, and small intestine.  Colonoscopy. This test uses a flexible, lighted tube to look at your colon. TREATMENT  Treatment depends on the cause of your bleeding.   For bleeding from the esophagus, stomach, small intestine, or colon, the caregiver doing your EGD or colonoscopy may be able to stop the bleeding as part of the procedure.  Inflammation or infection of the colon can be treated with medicines.  Many rectal problems can be treated with creams, suppositories, or warm baths.  Surgery is sometimes needed.  Blood transfusions are sometimes needed if you have lost a lot of blood. If bleeding is slow, you may be allowed to go home. If there is a lot of bleeding, you will need to stay in the hospital for observation. HOME CARE INSTRUCTIONS   Take any medicines exactly as prescribed.  Keep your stools soft by eating foods that are high in fiber. These foods include whole grains, legumes, fruits, and vegetables. Prunes (1 to 3 a day) work well for many people.  Drink enough fluids to keep your urine clear or pale yellow. SEEK IMMEDIATE MEDICAL CARE IF:   Your  bleeding increases.  You feel lightheaded, weak, or you faint.  You have severe cramps in your back or abdomen.  You pass large blood clots in your stool.  Your problems are getting worse. MAKE SURE YOU:   Understand these instructions.  Will watch your condition.  Will get help right away if you are not doing well or get worse.   This information is not intended to replace advice given to you by your health care provider. Make sure you discuss any questions you have with your health care provider.   Document Released: 05/18/2000 Document Revised: 05/07/2012 Document Reviewed: 11/08/2014 Elsevier Interactive Patient Education [employer]

## 2015-07-01 NOTE — ED Notes (Signed)
Given food with EDP approval.  

## 2015-07-01 NOTE — ED Notes (Signed)
EDP at bedside  

## 2015-07-01 NOTE — ED Notes (Signed)
Pt has multiple complaints, had episode of abd pain and dark/hard stools. Also having palpitations, chest discomfort and sob. ekg done at triage.
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
## 2015-07-04 ENCOUNTER — Ambulatory Visit (HOSPITAL_COMMUNITY)
Admit: 2015-07-04 | Discharge: 2015-07-04 | Disposition: A | Discharge status: Home / Self Care | Payer: Medicare HMO | Attending: Internal Medicine | Admitting: Internal Medicine

## 2015-07-04 DIAGNOSIS — I428 Other cardiomyopathies: Secondary | ICD-10-CM
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
## 2015-07-07 ENCOUNTER — Telehealth (HOSPITAL_COMMUNITY): Payer: Self-pay

## 2015-07-07 ENCOUNTER — Ambulatory Visit (HOSPITAL_COMMUNITY)
Admit: 2015-07-07 | Discharge: 2015-07-07 | Disposition: A | Discharge status: Home / Self Care | Payer: Medicare HMO | Source: Ambulatory Visit | Attending: Internal Medicine | Admitting: Internal Medicine

## 2015-07-07 ENCOUNTER — Ambulatory Visit (INDEPENDENT_AMBULATORY_CARE_PROVIDER_SITE_OTHER): Payer: Medicare HMO | Admitting: Internal Medicine

## 2015-07-07 ENCOUNTER — Ambulatory Visit (HOSPITAL_COMMUNITY): Payer: Medicare HMO

## 2015-07-07 ENCOUNTER — Encounter: Payer: Self-pay | Admitting: Internal Medicine

## 2015-07-07 ENCOUNTER — Encounter: Payer: Self-pay | Admitting: *Deleted

## 2015-07-07 ENCOUNTER — Encounter (HOSPITAL_COMMUNITY): Payer: Self-pay | Admitting: Internal Medicine

## 2015-07-07 VITALS — BP 110/58 | HR 89 | Wt 143.2 lb

## 2015-07-07 VITALS — BP 138/75 | HR 80 | Temp 98.2°F | Ht 68.0 in | Wt 140.0 lb

## 2015-07-07 DIAGNOSIS — I429 Cardiomyopathy, unspecified: Secondary | ICD-10-CM

## 2015-07-07 DIAGNOSIS — I428 Other cardiomyopathies: Secondary | ICD-10-CM | POA: Insufficient documentation

## 2015-07-07 DIAGNOSIS — Z888 Allergy status to other drugs, medicaments and biological substances status: Secondary | ICD-10-CM | POA: Diagnosis not present

## 2015-07-07 DIAGNOSIS — I480 Paroxysmal atrial fibrillation: Secondary | ICD-10-CM | POA: Diagnosis not present

## 2015-07-07 DIAGNOSIS — Z7902 Long term (current) use of antithrombotics/antiplatelets: Secondary | ICD-10-CM | POA: Insufficient documentation

## 2015-07-07 DIAGNOSIS — B1920 Unspecified viral hepatitis C without hepatic coma: Secondary | ICD-10-CM | POA: Diagnosis not present

## 2015-07-07 DIAGNOSIS — Z79899 Other long term (current) drug therapy: Secondary | ICD-10-CM | POA: Insufficient documentation

## 2015-07-07 DIAGNOSIS — Z87891 Personal history of nicotine dependence: Secondary | ICD-10-CM | POA: Insufficient documentation

## 2015-07-07 DIAGNOSIS — I110 Hypertensive heart disease with heart failure: Secondary | ICD-10-CM | POA: Diagnosis not present

## 2015-07-07 DIAGNOSIS — B182 Chronic viral hepatitis C: Secondary | ICD-10-CM

## 2015-07-07 DIAGNOSIS — I4891 Unspecified atrial fibrillation: Secondary | ICD-10-CM | POA: Insufficient documentation

## 2015-07-07 DIAGNOSIS — I5042 Chronic combined systolic (congestive) and diastolic (congestive) heart failure: Secondary | ICD-10-CM

## 2015-07-07 LAB — CBC
HCT: 30.1 % — ABNORMAL LOW (ref 39.0–52.0)
Hemoglobin: 9.3 g/dL — ABNORMAL LOW (ref 13.0–17.0)
MCH: 24.9 pg — AB (ref 26.0–34.0)
MCHC: 30.9 g/dL (ref 30.0–36.0)
MCV: 80.7 fL (ref 78.0–100.0)
PLATELETS: 258 10*3/uL (ref 150–400)
RBC: 3.73 MIL/uL — ABNORMAL LOW (ref 4.22–5.81)
WBC: 5 10*3/uL (ref 4.0–10.5)

## 2015-07-07 LAB — COMPREHENSIVE METABOLIC PANEL
ALBUMIN: 3.5 g/dL (ref 3.5–5.0)
ALK PHOS: 97 U/L (ref 38–126)
ALT: 23 U/L (ref 17–63)
AST: 34 U/L (ref 15–41)
Anion gap: 10 (ref 5–15)
BILIRUBIN TOTAL: 0.7 mg/dL (ref 0.3–1.2)
BUN: 23 mg/dL — AB (ref 6–20)
CALCIUM: 8.9 mg/dL (ref 8.9–10.3)
CO2: 21 mmol/L — AB (ref 22–32)
CREATININE: 1.15 mg/dL (ref 0.61–1.24)
Chloride: 109 mmol/L (ref 101–111)
GFR calc Af Amer: >60 mL/min (ref >60)
GFR calc non Af Amer: >60 mL/min (ref >60)
GLUCOSE: 164 mg/dL — AB (ref 65–99)
Potassium: 3.8 mmol/L (ref 3.5–5.1)
SODIUM: 140 mmol/L (ref 135–145)
TOTAL PROTEIN: 6.5 g/dL (ref 6.5–8.1)

## 2015-07-07 MED ORDER — HYDRALAZINE HCL 25 MG PO TABS: 37.5000 mg | ORAL_TABLET | Freq: Three times a day (TID) | ORAL | Status: DC

## 2015-07-07 MED ORDER — LEDIPASVIR-SOFOSBUVIR 90-400 MG PO TABS: 1.0000 | ORAL_TABLET | Freq: Every day | ORAL | Status: DC

## 2015-07-07 NOTE — Telephone Encounter (Signed)
Records up through 2/2 faxed to [employer] re: Long term disability Benefits [phone]) Claim # [id number]
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
## 2015-07-07 NOTE — Telephone Encounter (Signed)
error 

## 2015-07-07 NOTE — Patient Instructions (Signed)
Date 07/07/2015  Dear Mr. Dale [name], As discussed in the ID Clinic, your hepatitis C therapy will include the following medications:          Harvoni 90mg /400mg  tablet:           Take 1 tablet by mouth once daily   Please note that ALL MEDICATIONS WILL START ON THE SAME DATE for a total of 12 weeks. ---------------------------------------------------------------- Your HCV Treatment Start Date: TBA   Your HCV genotype:  1b    Liver Fibrosis: TBD    ---------------------------------------------------------------- YOUR PHARMACY CONTACT:   [name] [address] Phone: [phone] Hours: Monday to Friday 7:30 am to 6:00 pm   Please always contact your pharmacy at least 3-4 business days before you run out of medications to ensure your next month's medication is ready or 1 week prior to running out if you receive it by mail.  Remember, each prescription is for 28 days. ---------------------------------------------------------------- GENERAL NOTES REGARDING YOUR HEPATITIS C MEDICATION:  SOFOSBUVIR/LEDIPASVIR (HARVONI): - Harvoni tablet is taken daily with OR without food. - The tablets are orange. - The tablets should be stored at room temperature.  - Acid reducing agents such as H2 blockers (ie. Pepcid (famotidine), Zantac (ranitidine), Tagamet (cimetidine), Axid (nizatidine) and proton pump inhibitors (ie. Prilosec (omeprazole), Protonix (pantoprazole), Nexium (esomeprazole), or Aciphex (rabeprazole)) can decrease effectiveness of Harvoni. Do not take until you have discussed with a health care provider.    -Antacids that contain magnesium and/or aluminum hydroxide (ie. Milk of Magensia, Rolaids, Gaviscon, Maalox, Mylanta, an dArthritis Pain Formula)can reduce absorption of Harvoni, so take them at least 4 hours before or after Harvoni.  -Calcium carbonate (calcium supplements or antacids such as Tums, Caltrate,  Os-Cal)needs to be taken at least 4 hours hours before or after Harvoni.  -St. John's wort or any products that contain St. John's wort like some herbal supplements  Please inform the office prior to starting any of these medications.  - The common side effects associated with Harvoni include:      1. Fatigue      2. Headache      3. Nausea      4. Diarrhea      5. Insomnia  Please note that this only lists the most common side effects and is NOT a comprehensive list of the potential side effects of these medications. For more information, please review the drug information sheets that come with your medication package from the pharmacy.  ---------------------------------------------------------------- GENERAL HELPFUL HINTS ON HCV THERAPY: 1. Stay well-hydrated. 2. Notify the ID Clinic of any changes in your other over-the-counter/herbal or prescription medications. 3. If you miss a dose of your medication, take the missed dose as soon as you remember. Return to your regular time/dose schedule the next day.  4.  Do not stop taking your medications without first talking with your healthcare provider. 5.  You may take Tylenol (acetaminophen), as long as the dose is less than 2000 mg (OR no more than 4 tablets of the Tylenol Extra Strengths 500mg  tablet) in 24 hours. 6.  You will see our pharmacist-specialist within the first 2 weeks of starting your medication. 7.  You will need to obtain routine labs around week 4 and12 weeks after starting and then 3 to 6 months after finishing Harvoni.    Staci Righter, MD  [facility] [address] [place],  [place]  [address] [phone]
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
## 2015-07-07 NOTE — Patient Instructions (Signed)
Increase Hydralazine to 37.5 mg (1 & 1/2 tabs) Three times a day  Labs today  Your physician recommends that you schedule a follow-up appointment in: 1 month

## 2015-07-08 ENCOUNTER — Telehealth: Payer: Self-pay | Admitting: Cardiovascular Disease

## 2015-07-08 ENCOUNTER — Encounter: Payer: Self-pay | Admitting: Cardiovascular Disease

## 2015-07-08 DIAGNOSIS — I429 Cardiomyopathy, unspecified: Secondary | ICD-10-CM | POA: Diagnosis not present

## 2015-07-08 NOTE — Telephone Encounter (Signed)
Faxed FMLA paper work back to [employer] at [employer] --per [employer] and Dr. Purvis Sheffield pt's PCP needs to fill out paper work

## 2015-07-11 ENCOUNTER — Other Ambulatory Visit: Payer: Self-pay | Admitting: Pharmacist Clinician (PhC)/ Clinical Pharmacy Specialist

## 2015-07-11 ENCOUNTER — Telehealth: Payer: Self-pay | Admitting: Cardiovascular Disease

## 2015-07-11 LAB — HEPATITIS C RNA QUANTITATIVE
HCV QUANT LOG: 6.42 {Log} — AB (ref <1.18)
HCV QUANT: 2600543 [IU]/mL — AB (ref <15)

## 2015-07-11 MED ORDER — ELBASVIR-GRAZOPREVIR 50-100 MG PO TABS: 1.0000 | ORAL_TABLET | Freq: Every day | ORAL | Status: DC

## 2015-07-11 NOTE — Telephone Encounter (Signed)
Sent Disability paper work via [employer] to [employer] at [employer] [address] [employer] [address] RM 342, white folder

## 2015-07-11 NOTE — Progress Notes (Signed)
We are going to start the process for zepatier now. Dr. Gala Romney is ok to stop his amio temporary while he is on treatment for Hep C.

## 2015-07-18 ENCOUNTER — Other Ambulatory Visit: Payer: Medicare HMO

## 2015-07-18 ENCOUNTER — Other Ambulatory Visit: Payer: Self-pay | Admitting: Pharmacist Clinician (PhC)/ Clinical Pharmacy Specialist

## 2015-07-18 ENCOUNTER — Other Ambulatory Visit: Payer: Self-pay | Admitting: Internal Medicine

## 2015-07-18 DIAGNOSIS — B182 Chronic viral hepatitis C: Secondary | ICD-10-CM

## 2015-07-18 NOTE — Progress Notes (Signed)
He has insurance so we are going to get a Fibrosure today since it'll be quicker to get his Zepatier. Wife is going to bring him in this PM.

## 2015-07-21 LAB — LIVER FIBROSIS, FIBROTEST-ACTITEST
ALT: 26 U/L (ref 9–46)
Alpha-2-Macroglobulin: 265 mg/dL (ref 106–279)
Apolipoprotein A1: 115 mg/dL (ref 94–176)
BILIRUBIN: 0.5 mg/dL (ref 0.2–1.2)
Fibrosis Score: 0.75
GGT: 112 U/L — AB (ref 3–70)
Haptoglobin: 80 mg/dL (ref 43–212)
NECROINFLAMMAT ACT SCORE: 0.21
Reference ID: 1440758

## 2015-07-26 ENCOUNTER — Ambulatory Visit: Payer: Medicare HMO | Admitting: Cardiovascular Disease

## 2015-08-02 MED FILL — *ZEPATIER 50-100 MG TABLET: 50-100 | 28 days supply | Qty: 28 | Fill #0

## 2015-08-04 ENCOUNTER — Encounter (HOSPITAL_COMMUNITY): Payer: Self-pay | Admitting: Internal Medicine

## 2015-08-04 ENCOUNTER — Ambulatory Visit (HOSPITAL_COMMUNITY)
Admission: RE | Admit: 2015-08-04 | Discharge: 2015-08-04 | Disposition: A | Discharge status: Home / Self Care | Payer: Medicare HMO | Source: Ambulatory Visit | Attending: Internal Medicine | Admitting: Internal Medicine

## 2015-08-04 VITALS — BP 108/58 | HR 97 | Wt 141.0 lb

## 2015-08-04 DIAGNOSIS — Z7902 Long term (current) use of antithrombotics/antiplatelets: Secondary | ICD-10-CM | POA: Insufficient documentation

## 2015-08-04 DIAGNOSIS — I4891 Unspecified atrial fibrillation: Secondary | ICD-10-CM | POA: Diagnosis not present

## 2015-08-04 DIAGNOSIS — I428 Other cardiomyopathies: Secondary | ICD-10-CM | POA: Insufficient documentation

## 2015-08-04 DIAGNOSIS — I5042 Chronic combined systolic (congestive) and diastolic (congestive) heart failure: Secondary | ICD-10-CM | POA: Diagnosis present

## 2015-08-04 DIAGNOSIS — Z87891 Personal history of nicotine dependence: Secondary | ICD-10-CM | POA: Diagnosis not present

## 2015-08-04 DIAGNOSIS — Z79899 Other long term (current) drug therapy: Secondary | ICD-10-CM | POA: Diagnosis not present

## 2015-08-04 DIAGNOSIS — Z862 Personal history of diseases of the blood and blood-forming organs and certain disorders involving the immune mechanism: Secondary | ICD-10-CM | POA: Insufficient documentation

## 2015-08-04 DIAGNOSIS — I110 Hypertensive heart disease with heart failure: Secondary | ICD-10-CM | POA: Insufficient documentation

## 2015-08-04 DIAGNOSIS — I480 Paroxysmal atrial fibrillation: Secondary | ICD-10-CM | POA: Diagnosis not present

## 2015-08-04 DIAGNOSIS — Z888 Allergy status to other drugs, medicaments and biological substances status: Secondary | ICD-10-CM | POA: Insufficient documentation

## 2015-08-04 DIAGNOSIS — B1920 Unspecified viral hepatitis C without hepatic coma: Secondary | ICD-10-CM | POA: Insufficient documentation

## 2015-08-04 MED ORDER — CARVEDILOL 3.125 MG PO TABS: 3.1250 mg | ORAL_TABLET | Freq: Two times a day (BID) | ORAL | Status: DC

## 2015-08-04 NOTE — Progress Notes (Signed)
Advanced Heart Failure Medication Review by a Pharmacist  Does the patient  feel that his/her medications are working for him/her?  yes  Has the patient been experiencing any side effects to the medications prescribed?  no  Does the patient measure his/her own blood pressure or blood glucose at home?  yes   Does the patient have any problems obtaining medications due to transportation or finances?   no  Understanding of regimen: good Understanding of indications: good Potential of compliance: good Patient understands to avoid NSAIDs. Patient understands to avoid decongestants.  Issues to address at subsequent visits: None   Pharmacist comments:  Mr. Dale [name] is a pleasant 70 yo M presenting with his medication bottles. He reports good compliance with his regimen but did state that he has not started his new HCV medication (Zepatier) because he wanted to make sure Dr. Gala Romney was ok with it. He did stop taking amiodarone today though due to the potential interaction with this new medication.   [name]. Bonnye Fava, PharmD, BCPS, CPP Clinical Pharmacist Pager: [phone] Phone: [phone] 08/04/2015 12:39 PM      Time with patient: 10 minutes Preparation and documentation time: 2 minutes Total time: 12 minutes
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
## 2015-08-04 NOTE — Patient Instructions (Signed)
START Carvedilol 3.125 mg tablet twice daily.  Follow up 4 weeks.  Do the following things EVERYDAY: 1) Weigh yourself in the morning before breakfast. Write it down and keep it in a log. 2) Take your medicines as prescribed 3) Eat low salt foods-Limit salt (sodium) to 2000 mg per day.  4) Stay as active as you can everyday 5) Limit all fluids for the day to less than 2 liters

## 2015-08-08 ENCOUNTER — Encounter: Payer: Self-pay | Admitting: Pharmacy Technician

## 2015-08-08 ENCOUNTER — Telehealth (HOSPITAL_COMMUNITY): Payer: Self-pay

## 2015-08-08 NOTE — Telephone Encounter (Signed)
Bethann Goo OT with [facility] called to CHF clinic to get VO for increasing patient's OT to twice weekly. VO given per Dr. Marca Ancona. Also reports patient's BP since starting coreg is 110/50, no dizziness, and is feeling ok.  Ave Filter

## 2015-08-14 ENCOUNTER — Telehealth: Payer: Self-pay | Admitting: Internal Medicine

## 2015-08-14 NOTE — Telephone Encounter (Signed)
Dale [name] called because Dale [name] bp was 116/44. He is asymptomatic. Per Dale [name] he typically runs 110s/50s. However, he's been weak before and recently started 3.125 mg big carvedilol. He is currently asymptomatic. He received tonight's dose. I said to watch symptoms and call clinic in Am for further questions. If he has symptoms or concerns tonight I said they should come to the ER.   Dale Must, MD

## 2015-08-22 ENCOUNTER — Ambulatory Visit: Payer: Medicare HMO

## 2015-08-22 ENCOUNTER — Ambulatory Visit (INDEPENDENT_AMBULATORY_CARE_PROVIDER_SITE_OTHER): Payer: Medicare HMO | Admitting: Pharmacist Clinician (PhC)/ Clinical Pharmacy Specialist

## 2015-08-22 DIAGNOSIS — B182 Chronic viral hepatitis C: Secondary | ICD-10-CM
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
## 2015-08-22 NOTE — Patient Instructions (Signed)
Cont zepatier 1 tablet daily Come back in 10 days for lab

## 2015-08-25 MED FILL — *ZEPATIER 50-100 MG TABLET: 50-100 | 28 days supply | Qty: 28 | Fill #1

## 2015-08-26 ENCOUNTER — Telehealth (HOSPITAL_COMMUNITY): Payer: Self-pay | Admitting: *Deleted

## 2015-08-26 DIAGNOSIS — I130 Hypertensive heart and chronic kidney disease with heart failure and stage 1 through stage 4 chronic kidney disease, or unspecified chronic kidney disease: Secondary | ICD-10-CM | POA: Diagnosis not present

## 2015-08-26 DIAGNOSIS — N183 Chronic kidney disease, stage 3 (moderate): Secondary | ICD-10-CM | POA: Diagnosis not present

## 2015-08-26 DIAGNOSIS — I5023 Acute on chronic systolic (congestive) heart failure: Secondary | ICD-10-CM | POA: Diagnosis not present

## 2015-08-26 DIAGNOSIS — I480 Paroxysmal atrial fibrillation: Secondary | ICD-10-CM | POA: Diagnosis not present

## 2015-08-26 NOTE — Telephone Encounter (Signed)
Ali left a VM yesterday stating she needed a VO to re-cert pt, called her back and left a VM on her ID VM [id number] to re-cert

## 2015-09-01 ENCOUNTER — Other Ambulatory Visit: Payer: Medicare HMO

## 2015-09-05 ENCOUNTER — Encounter (HOSPITAL_COMMUNITY): Payer: Self-pay | Admitting: *Deleted

## 2015-09-05 ENCOUNTER — Other Ambulatory Visit: Payer: Self-pay | Admitting: Internal Medicine

## 2015-09-05 ENCOUNTER — Telehealth: Payer: Self-pay | Admitting: *Deleted

## 2015-09-05 ENCOUNTER — Other Ambulatory Visit: Payer: Medicare HMO

## 2015-09-05 ENCOUNTER — Ambulatory Visit (HOSPITAL_COMMUNITY)
Admission: RE | Admit: 2015-09-05 | Discharge: 2015-09-05 | Disposition: A | Discharge status: Home / Self Care | Payer: Medicare HMO | Source: Ambulatory Visit | Attending: Internal Medicine | Admitting: Internal Medicine

## 2015-09-05 VITALS — BP 102/54 | HR 105 | Wt 130.8 lb

## 2015-09-05 DIAGNOSIS — Z79899 Other long term (current) drug therapy: Principal | ICD-10-CM

## 2015-09-05 DIAGNOSIS — Z87891 Personal history of nicotine dependence: Secondary | ICD-10-CM | POA: Insufficient documentation

## 2015-09-05 DIAGNOSIS — I428 Other cardiomyopathies: Secondary | ICD-10-CM | POA: Diagnosis not present

## 2015-09-05 DIAGNOSIS — B1710 Acute hepatitis C without hepatic coma: Secondary | ICD-10-CM

## 2015-09-05 DIAGNOSIS — M1990 Unspecified osteoarthritis, unspecified site: Secondary | ICD-10-CM | POA: Insufficient documentation

## 2015-09-05 DIAGNOSIS — I110 Hypertensive heart disease with heart failure: Secondary | ICD-10-CM | POA: Diagnosis not present

## 2015-09-05 DIAGNOSIS — Z5181 Encounter for therapeutic drug level monitoring: Secondary | ICD-10-CM

## 2015-09-05 DIAGNOSIS — Z7901 Long term (current) use of anticoagulants: Secondary | ICD-10-CM | POA: Insufficient documentation

## 2015-09-05 DIAGNOSIS — B182 Chronic viral hepatitis C: Secondary | ICD-10-CM

## 2015-09-05 DIAGNOSIS — Z888 Allergy status to other drugs, medicaments and biological substances status: Secondary | ICD-10-CM | POA: Insufficient documentation

## 2015-09-05 DIAGNOSIS — I480 Paroxysmal atrial fibrillation: Secondary | ICD-10-CM | POA: Insufficient documentation

## 2015-09-05 DIAGNOSIS — K5520 Angiodysplasia of colon without hemorrhage: Secondary | ICD-10-CM | POA: Diagnosis not present

## 2015-09-05 DIAGNOSIS — I5042 Chronic combined systolic (congestive) and diastolic (congestive) heart failure: Secondary | ICD-10-CM | POA: Insufficient documentation

## 2015-09-05 LAB — COMPREHENSIVE METABOLIC PANEL
ALBUMIN: 4.2 g/dL (ref 3.6–5.1)
ALK PHOS: 141 U/L — AB (ref 40–115)
ALT: 36 U/L (ref 9–46)
AST: 41 U/L — ABNORMAL HIGH (ref 10–35)
BILIRUBIN TOTAL: 0.9 mg/dL (ref 0.2–1.2)
BUN: 28 mg/dL — ABNORMAL HIGH (ref 7–25)
CALCIUM: 9.2 mg/dL (ref 8.6–10.3)
CO2: 24 mmol/L (ref 20–31)
Chloride: 99 mmol/L (ref 98–110)
Creat: 1.33 mg/dL — ABNORMAL HIGH (ref 0.70–1.18)
GLUCOSE: 137 mg/dL — AB (ref 65–99)
POTASSIUM: 4.4 mmol/L (ref 3.5–5.3)
Sodium: 137 mmol/L (ref 135–146)
Total Protein: 8 g/dL (ref 6.1–8.1)

## 2015-09-05 LAB — CBC WITH DIFFERENTIAL/PLATELET
Basophils Absolute: 63 cells/uL (ref 0–200)
Basophils Relative: 1 %
EOS PCT: 2 %
Eosinophils Absolute: 126 cells/uL (ref 15–500)
HCT: 31 % — ABNORMAL LOW (ref 38.5–50.0)
Hemoglobin: 8.9 g/dL — ABNORMAL LOW (ref 13.2–17.1)
LYMPHS PCT: 31 %
Lymphs Abs: 1953 cells/uL (ref 850–3900)
MCH: 20.5 pg — ABNORMAL LOW (ref 27.0–33.0)
MCHC: 28.7 g/dL — AB (ref 32.0–36.0)
MCV: 71.4 fL — ABNORMAL LOW (ref 80.0–100.0)
MONOS PCT: 12 %
MPV: 10.4 fL (ref 7.5–12.5)
Monocytes Absolute: 756 cells/uL (ref 200–950)
NEUTROS PCT: 54 %
Neutro Abs: 3402 cells/uL (ref 1500–7800)
Platelets: 425 10*3/uL — ABNORMAL HIGH (ref 140–400)
RBC: 4.34 MIL/uL (ref 4.20–5.80)
RDW: 20.9 % — AB (ref 11.0–15.0)
WBC: 6.3 10*3/uL (ref 3.8–10.8)

## 2015-09-05 LAB — PROTIME-INR
INR: 1.87 — ABNORMAL HIGH (ref <1.50)
Prothrombin Time: 21.8 seconds — ABNORMAL HIGH (ref 11.6–15.2)

## 2015-09-05 LAB — DIGOXIN LEVEL: DIGOXIN LVL: 2 ug/L (ref 0.8–2.0)

## 2015-09-05 MED ORDER — FUROSEMIDE 40 MG PO TABS: 20.0000 mg | ORAL_TABLET | Freq: Every day | ORAL | Status: DC
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
## 2015-09-05 NOTE — Telephone Encounter (Signed)
Per [name], patient did come for his lab appointment and these labs were drawn. Dale [name]

## 2015-09-05 NOTE — Telephone Encounter (Signed)
-----   Message from Gardiner Barefoot, MD sent at 09/05/2015  9:47 AM EDT ----- He is coming today for a lab appt at 11 am and I added several other labs.  Just want to be sure they are done.   Also, Minh, sounds like he has been losing weight, fatigue.  May be his heart failure but could you double check any potential interactions?  Thanks

## 2015-09-05 NOTE — Addendum Note (Signed)
Addended by: Mariea Clonts D on: 09/05/2015 11:18 AM   Modules accepted: Orders

## 2015-09-05 NOTE — Progress Notes (Signed)
Advanced Heart Failure Medication Review by a Pharmacist  Does the patient  feel that his/her medications are working for him/her?  yes  Has the patient been experiencing any side effects to the medications prescribed?  no  Does the patient measure his/her own blood pressure or blood glucose at home?  yes   Does the patient have any problems obtaining medications due to transportation or finances?   no  Understanding of regimen: good Understanding of indications: good Potential of compliance: excellent Patient understands to avoid NSAIDs. Patient understands to avoid decongestants.  Issues to address at subsequent visits: None   Pharmacist comments:  Mr. Randles is a pleasant 70 yo M presenting with his wife and a current medication list. He reports excellent compliance with his regimen. He asked if any of his current medications could be causing appetite suppression. His new Zepatier carries a 2% incidence of this and he was told to discuss this with the MD who is prescribing it.   [name]. Bonnye Fava, PharmD, BCPS, CPP Clinical Pharmacist Pager: [phone] Phone: [phone] 09/05/2015 9:40 AM      Time with patient: 8 minutes Preparation and documentation time: 2 minutes Total time: 10 minutes

## 2015-09-05 NOTE — Patient Instructions (Signed)
Decrease Furosemide to 20 mg (1/2 tab) daily  Right Heart Catheterization Mon 4/10, see instruction sheet  Your physician recommends that you schedule a follow-up appointment in: 1 month

## 2015-09-06 ENCOUNTER — Ambulatory Visit (INDEPENDENT_AMBULATORY_CARE_PROVIDER_SITE_OTHER): Payer: Medicare HMO | Admitting: Internal Medicine

## 2015-09-06 ENCOUNTER — Encounter: Payer: Self-pay | Admitting: Internal Medicine

## 2015-09-06 ENCOUNTER — Other Ambulatory Visit (HOSPITAL_COMMUNITY): Payer: Self-pay | Admitting: *Deleted

## 2015-09-06 VITALS — BP 115/68 | HR 94 | Temp 97.9°F | Ht 68.0 in | Wt 130.0 lb

## 2015-09-06 DIAGNOSIS — K7400 Hepatic fibrosis, unspecified: Secondary | ICD-10-CM | POA: Insufficient documentation

## 2015-09-06 DIAGNOSIS — B182 Chronic viral hepatitis C: Secondary | ICD-10-CM | POA: Diagnosis not present

## 2015-09-06 DIAGNOSIS — I5022 Chronic systolic (congestive) heart failure: Secondary | ICD-10-CM

## 2015-09-06 LAB — HEPATITIS C RNA QUANTITATIVE: HCV Quantitative: NOT DETECTED IU/mL (ref <15)

## 2015-09-06 LAB — AFP TUMOR MARKER: AFP TUMOR MARKER: 4.5 ng/mL (ref <6.1)

## 2015-09-06 NOTE — Assessment & Plan Note (Signed)
Concerning for cirrhosis.  I will check elastography when able, likely after treatment when he is feeling better.  Will need HCC screen every 6 months.

## 2015-09-06 NOTE — Progress Notes (Signed)
   Subjective:    Patient ID: Dale [name], male    DOB: [birth year], 70 y.o.   MRN: [id number]  HPI Here for follow up of HCV.  Has genotype 1b, started on Zepatier for 12 weeks.  Now 4 weeks into treatment and had labs yesterday.  Has had significant fatigue.  His wife notes that he has been just sitting on the couch, no energy, just since starting Zepatier.  Prior to the medication, she states he was active and working.  Is off amiodarone on zepatier.  No issues noted on labs, HCV viral load pending. Getting right heart cath next Monday. Never got elastography but did get Fibrosure and is F4.     Review of Systems  Constitutional: Positive for appetite change and fatigue.  Gastrointestinal: Negative for diarrhea.  Skin: Negative for rash.  Neurological: Negative for dizziness.       Objective:   Physical Exam  Constitutional: He appears well-developed and well-nourished. No distress.  Eyes: No scleral icterus.  Cardiovascular: Normal rate, regular rhythm and normal heart sounds.   No murmur heard. Pulmonary/Chest: Effort normal and breath sounds normal. No respiratory distress.  Skin: No rash noted.          Assessment & Plan:

## 2015-09-06 NOTE — Assessment & Plan Note (Addendum)
Viral load pending.  Will let him know of results. Fatigue certainly may be related to Zepatier.  rtc 4 weeks.

## 2015-09-07 ENCOUNTER — Telehealth: Payer: Self-pay | Admitting: *Deleted

## 2015-09-07 NOTE — Telephone Encounter (Signed)
-----   Message from Gardiner Barefoot, MD sent at 09/07/2015 12:09 PM EDT ----- Please let him know that his HCV virus is undetectable.  So far so good, keep taking the medication.  thanks

## 2015-09-07 NOTE — Telephone Encounter (Signed)
Relayed the information, patient pleased. Andree Coss, RN

## 2015-09-08 ENCOUNTER — Other Ambulatory Visit: Payer: Self-pay | Admitting: Student

## 2015-09-12 ENCOUNTER — Encounter (HOSPITAL_COMMUNITY)
Admission: RE | Disposition: A | Discharge status: Home / Self Care | Payer: Self-pay | Source: Ambulatory Visit | Attending: Internal Medicine

## 2015-09-12 ENCOUNTER — Encounter (HOSPITAL_COMMUNITY): Payer: Self-pay | Admitting: Internal Medicine

## 2015-09-12 ENCOUNTER — Ambulatory Visit (HOSPITAL_COMMUNITY)
Admission: RE | Admit: 2015-09-12 | Discharge: 2015-09-12 | Disposition: A | Discharge status: Home / Self Care | Payer: Medicare HMO | Source: Ambulatory Visit | Attending: Internal Medicine | Admitting: Internal Medicine

## 2015-09-12 DIAGNOSIS — I5042 Chronic combined systolic (congestive) and diastolic (congestive) heart failure: Secondary | ICD-10-CM | POA: Insufficient documentation

## 2015-09-12 DIAGNOSIS — I959 Hypotension, unspecified: Secondary | ICD-10-CM | POA: Diagnosis not present

## 2015-09-12 DIAGNOSIS — I5022 Chronic systolic (congestive) heart failure: Secondary | ICD-10-CM | POA: Diagnosis not present

## 2015-09-12 DIAGNOSIS — Z87891 Personal history of nicotine dependence: Secondary | ICD-10-CM | POA: Insufficient documentation

## 2015-09-12 DIAGNOSIS — I429 Cardiomyopathy, unspecified: Secondary | ICD-10-CM | POA: Diagnosis not present

## 2015-09-12 DIAGNOSIS — Z7901 Long term (current) use of anticoagulants: Secondary | ICD-10-CM | POA: Diagnosis not present

## 2015-09-12 DIAGNOSIS — I110 Hypertensive heart disease with heart failure: Secondary | ICD-10-CM | POA: Diagnosis not present

## 2015-09-12 DIAGNOSIS — I480 Paroxysmal atrial fibrillation: Secondary | ICD-10-CM | POA: Insufficient documentation

## 2015-09-12 HISTORY — PX: CARDIAC CATHETERIZATION: SHX172

## 2015-09-12 LAB — POCT I-STAT 3, VENOUS BLOOD GAS (G3P V)
BICARBONATE: 24.9 meq/L — AB (ref 20.0–24.0)
BICARBONATE: 25.3 meq/L — AB (ref 20.0–24.0)
O2 SAT: 63 %
O2 SAT: 64 %
PCO2 VEN: 40.9 mmHg — AB (ref 45.0–50.0)
PO2 VEN: 33 mmHg (ref 31.0–45.0)
TCO2: 26 mmol/L (ref 0–100)
TCO2: 26 mmol/L (ref 0–100)
pCO2, Ven: 41 mmHg — ABNORMAL LOW (ref 45.0–50.0)
pH, Ven: 7.393 — ABNORMAL HIGH (ref 7.250–7.300)
pH, Ven: 7.397 — ABNORMAL HIGH (ref 7.250–7.300)
pO2, Ven: 33 mmHg (ref 31.0–45.0)

## 2015-09-12 SURGERY — RIGHT HEART CATH

## 2015-09-12 MED ORDER — HEPARIN (PORCINE) IN NACL 2-0.9 UNIT/ML-% IJ SOLN
INTRAMUSCULAR | Status: AC
  Filled 2015-09-12: qty 500

## 2015-09-12 MED ORDER — SODIUM CHLORIDE 0.9% FLUSH: 3.0000 mL | INTRAVENOUS | Status: DC | PRN

## 2015-09-12 MED ORDER — ASPIRIN 81 MG PO CHEW
81.0000 mg | CHEWABLE_TABLET | ORAL | Status: AC
  Administered 2015-09-12: 81 mg via ORAL

## 2015-09-12 MED ORDER — ACETAMINOPHEN 325 MG PO TABS
650.0000 mg | ORAL_TABLET | ORAL | Status: DC | PRN
Start: 2015-09-12 — End: 2015-09-12

## 2015-09-12 MED ORDER — SODIUM CHLORIDE 0.9 % IV BOLUS (SEPSIS): 500.0000 mL | Freq: Once | INTRAVENOUS | Status: DC

## 2015-09-12 MED ORDER — ONDANSETRON HCL 4 MG/2ML IJ SOLN: 4.0000 mg | Freq: Four times a day (QID) | INTRAMUSCULAR | Status: DC | PRN

## 2015-09-12 MED ORDER — SODIUM CHLORIDE 0.9% FLUSH: 3.0000 mL | Freq: Two times a day (BID) | INTRAVENOUS | Status: DC

## 2015-09-12 MED ORDER — LIDOCAINE HCL (PF) 1 % IJ SOLN
INTRAMUSCULAR | Status: DC | PRN
  Administered 2015-09-12: 5 mL via INTRADERMAL

## 2015-09-12 MED ORDER — SODIUM CHLORIDE 0.9 % IV SOLN: 250.0000 mL | INTRAVENOUS | Status: DC | PRN

## 2015-09-12 MED ORDER — IOPAMIDOL (ISOVUE-370) INJECTION 76%
INTRAVENOUS | Status: AC
  Filled 2015-09-12: qty 50

## 2015-09-12 MED ORDER — HEPARIN SODIUM (PORCINE) 1000 UNIT/ML IJ SOLN
INTRAMUSCULAR | Status: AC
  Filled 2015-09-12: qty 1

## 2015-09-12 MED ORDER — NITROGLYCERIN 1 MG/10 ML FOR IR/CATH LAB
INTRA_ARTERIAL | Status: AC
  Filled 2015-09-12: qty 10

## 2015-09-12 MED ORDER — FENTANYL CITRATE (PF) 100 MCG/2ML IJ SOLN
INTRAMUSCULAR | Status: AC
  Filled 2015-09-12: qty 2

## 2015-09-12 MED ORDER — SODIUM CHLORIDE 0.9 % IV SOLN
INTRAVENOUS | Status: DC
  Administered 2015-09-12: 500 mL via INTRAVENOUS
  Administered 2015-09-12: 07:00:00 via INTRAVENOUS

## 2015-09-12 MED ORDER — IOPAMIDOL (ISOVUE-370) INJECTION 76%
INTRAVENOUS | Status: AC
  Filled 2015-09-12: qty 100

## 2015-09-12 MED ORDER — HEPARIN (PORCINE) IN NACL 2-0.9 UNIT/ML-% IJ SOLN
INTRAMUSCULAR | Status: DC | PRN
  Administered 2015-09-12: 500 mL

## 2015-09-12 MED ORDER — MIDAZOLAM HCL 2 MG/2ML IJ SOLN
INTRAMUSCULAR | Status: AC
  Filled 2015-09-12: qty 2

## 2015-09-12 MED ORDER — LIDOCAINE HCL (PF) 1 % IJ SOLN
INTRAMUSCULAR | Status: AC
  Filled 2015-09-12: qty 30

## 2015-09-12 MED ORDER — ASPIRIN 81 MG PO CHEW
CHEWABLE_TABLET | ORAL | Status: AC
  Administered 2015-09-12: 81 mg via ORAL
  Filled 2015-09-12: qty 1

## 2015-09-12 SURGICAL SUPPLY — 9 items
CATH SWAN GANZ 7F STRAIGHT (CATHETERS) ×3 IMPLANT
COVER PRB 48X5XTLSCP FOLD TPE (BAG) ×1 IMPLANT
COVER PROBE 5X48 (BAG) ×2
KIT HEART LEFT (KITS) ×3 IMPLANT
PACK CARDIAC CATHETERIZATION (CUSTOM PROCEDURE TRAY) ×3 IMPLANT
SET INTRODUCER MICROPUNCT 5F (INTRODUCER) ×3 IMPLANT
SHEATH PINNACLE 7F 10CM (SHEATH) ×3 IMPLANT
SLEEVE REPOSITIONING LENGTH 30 (MISCELLANEOUS) ×3 IMPLANT
TRANSDUCER W/STOPCOCK (MISCELLANEOUS) ×3 IMPLANT

## 2015-09-12 NOTE — Progress Notes (Signed)
Site area: right groin a 7 french venous sheath  Site Prior to Removal:  Level 0  Pressure Applied For 10 MINUTES    Bedrest Beginning at 0835a  Manual:   Yes.    Patient Status During Pull:  stable  Post Pull Groin Site:  Level 0  Post Pull Instructions Given:  Yes.    Post Pull Pulses Present:  Yes.    Dressing Applied:  Yes.    Comments:  VS remain stable during sheath pull

## 2015-09-12 NOTE — Interval H&P Note (Signed)
History and Physical Interval Note:  09/12/2015 6:21 AM  Dale [name]  has presented today for surgery, with the diagnosis of hf  The various methods of treatment have been discussed with the patient and family. After consideration of risks, benefits and other options for treatment, the patient has consented to  Procedure(s): Right Heart Cath (N/A) as a surgical intervention .  The patient's history has been reviewed, patient examined, no change in status, stable for surgery.  I have reviewed the patient's chart and labs.  Questions were answered to the patient's satisfaction.     Dyneisha Murchison, Reuel Boom

## 2015-09-12 NOTE — Discharge Instructions (Signed)
Angiogram, Care After °Refer to this sheet in the next few weeks. These instructions provide you with information about caring for yourself after your procedure. Your health care provider may also give you more specific instructions. Your treatment has been planned according to current medical practices, but problems sometimes occur. Call your health care provider if you have any problems or questions after your procedure. °WHAT TO EXPECT AFTER THE PROCEDURE °After your procedure, it is typical to have the following: °· Bruising at the catheter insertion site that usually fades within 1-2 weeks. °· Blood collecting in the tissue (hematoma) that may be painful to the touch. It should usually decrease in size and tenderness within 1-2 weeks. °HOME CARE INSTRUCTIONS °· Take medicines only as directed by your health care provider. °· You may shower 24-48 hours after the procedure or as directed by your health care provider. Remove the bandage (dressing) and gently wash the site with plain soap and water. Pat the area dry with a clean towel. Do not rub the site, because this may cause bleeding. °· Do not take baths, swim, or use a hot tub until your health care provider approves. °· Check your insertion site every day for redness, swelling, or drainage. °· Do not apply powder or lotion to the site. °· Do not lift over 10 lb (4.5 kg) for 5 days after your procedure or as directed by your health care provider. °· Ask your health care provider when it is okay to: °¨ Return to work or school. °¨ Resume usual physical activities or sports. °¨ Resume sexual activity. °· Do not drive home if you are discharged the same day as the procedure. Have someone else drive you. °· You may drive 24 hours after the procedure unless otherwise instructed by your health care provider. °· Do not operate machinery or power tools for 24 hours after the procedure or as directed by your health care provider. °· If your procedure was done as an  outpatient procedure, which means that you went home the same day as your procedure, a responsible adult should be with you for the first 24 hours after you arrive home. °· Keep all follow-up visits as directed by your health care provider. This is important. °SEEK MEDICAL CARE IF: °· You have a fever. °· You have chills. °· You have increased bleeding from the catheter insertion site. Hold pressure on the site. °SEEK IMMEDIATE MEDICAL CARE IF: °· You have unusual pain at the catheter insertion site. °· You have redness, warmth, or swelling at the catheter insertion site. °· You have drainage (other than a small amount of blood on the dressing) from the catheter insertion site. °· The catheter insertion site is bleeding, and the bleeding does not stop after 30 minutes of holding steady pressure on the site. °· The area near or just beyond the catheter insertion site becomes pale, cool, tingly, or numb. °  °This information is not intended to replace advice given to you by your health care provider. Make sure you discuss any questions you have with your health care provider. °  °Document Released: 12/07/2004 Document Revised: 06/11/2014 Document Reviewed: 10/22/2012 °[employer]vier Interactive Patient Education ©2016 [employer] ° °
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
## 2015-09-15 ENCOUNTER — Ambulatory Visit: Payer: Medicare HMO | Admitting: Internal Medicine

## 2015-09-19 ENCOUNTER — Telehealth (HOSPITAL_COMMUNITY): Payer: Self-pay | Admitting: *Deleted

## 2015-09-19 NOTE — Telephone Encounter (Signed)
Received VM from [place] request VO to continue pt's PT, called left her a VM ok to continue/extend PT

## 2015-09-20 ENCOUNTER — Other Ambulatory Visit (HOSPITAL_COMMUNITY): Payer: Self-pay | Admitting: Internal Medicine

## 2015-09-21 MED FILL — *ZEPATIER 50-100 MG TABLET: 50-100 | 28 days supply | Qty: 28 | Fill #2

## 2015-10-05 ENCOUNTER — Ambulatory Visit (INDEPENDENT_AMBULATORY_CARE_PROVIDER_SITE_OTHER): Payer: Medicare HMO | Admitting: Internal Medicine

## 2015-10-05 ENCOUNTER — Encounter: Payer: Self-pay | Admitting: Internal Medicine

## 2015-10-05 ENCOUNTER — Ambulatory Visit (HOSPITAL_COMMUNITY)
Admission: RE | Admit: 2015-10-05 | Discharge: 2015-10-05 | Disposition: A | Discharge status: Home / Self Care | Payer: Medicare HMO | Source: Ambulatory Visit | Attending: Internal Medicine | Admitting: Internal Medicine

## 2015-10-05 ENCOUNTER — Encounter (HOSPITAL_COMMUNITY): Payer: Self-pay | Admitting: Internal Medicine

## 2015-10-05 ENCOUNTER — Ambulatory Visit: Payer: Medicare HMO | Admitting: Internal Medicine

## 2015-10-05 VITALS — BP 113/61 | HR 73 | Temp 98.1°F | Wt 137.0 lb

## 2015-10-05 VITALS — BP 130/50 | HR 76 | Resp 18 | Wt 135.5 lb

## 2015-10-05 DIAGNOSIS — I5042 Chronic combined systolic (congestive) and diastolic (congestive) heart failure: Secondary | ICD-10-CM | POA: Diagnosis not present

## 2015-10-05 DIAGNOSIS — Z888 Allergy status to other drugs, medicaments and biological substances status: Secondary | ICD-10-CM | POA: Diagnosis not present

## 2015-10-05 DIAGNOSIS — K7400 Hepatic fibrosis, unspecified: Secondary | ICD-10-CM

## 2015-10-05 DIAGNOSIS — Z79899 Other long term (current) drug therapy: Secondary | ICD-10-CM | POA: Diagnosis not present

## 2015-10-05 DIAGNOSIS — I428 Other cardiomyopathies: Secondary | ICD-10-CM | POA: Insufficient documentation

## 2015-10-05 DIAGNOSIS — D649 Anemia, unspecified: Secondary | ICD-10-CM | POA: Diagnosis not present

## 2015-10-05 DIAGNOSIS — B182 Chronic viral hepatitis C: Secondary | ICD-10-CM | POA: Diagnosis not present

## 2015-10-05 DIAGNOSIS — I480 Paroxysmal atrial fibrillation: Secondary | ICD-10-CM

## 2015-10-05 DIAGNOSIS — Z7901 Long term (current) use of anticoagulants: Secondary | ICD-10-CM | POA: Insufficient documentation

## 2015-10-05 DIAGNOSIS — Z87891 Personal history of nicotine dependence: Secondary | ICD-10-CM | POA: Insufficient documentation

## 2015-10-05 DIAGNOSIS — I4891 Unspecified atrial fibrillation: Secondary | ICD-10-CM | POA: Insufficient documentation

## 2015-10-05 DIAGNOSIS — I110 Hypertensive heart disease with heart failure: Secondary | ICD-10-CM | POA: Insufficient documentation

## 2015-10-05 LAB — BASIC METABOLIC PANEL
ANION GAP: 10 (ref 5–15)
BUN: 19 mg/dL (ref 6–20)
CO2: 22 mmol/L (ref 22–32)
Calcium: 9.3 mg/dL (ref 8.9–10.3)
Chloride: 107 mmol/L (ref 101–111)
Creatinine, Ser: 1.05 mg/dL (ref 0.61–1.24)
GFR calc Af Amer: >60 mL/min (ref >60)
GLUCOSE: 95 mg/dL (ref 65–99)
POTASSIUM: 4.3 mmol/L (ref 3.5–5.1)
Sodium: 139 mmol/L (ref 135–145)

## 2015-10-05 LAB — DIGOXIN LEVEL: Digoxin Level: 1 ng/mL (ref 0.8–2.0)

## 2015-10-05 MED ORDER — HYDRALAZINE HCL 50 MG PO TABS: 50.0000 mg | ORAL_TABLET | Freq: Three times a day (TID) | ORAL | Status: DC

## 2015-10-05 NOTE — Progress Notes (Signed)
   Subjective:    Patient ID: Dale [name], male    DOB: [birth year], 70 y.o.   MRN: [id number]  HPI Here for follow up of HCV.  Has genotype 1b, started on Zepatier for 12 weeks.  Now 8 weeks into treatment and had labs previously with viral load now undetectable.  Has had significant fatigue but is better now and not complaining. His appetite is better with the diet restrictions relaxed by Dr. Gala Romney.  Is off amiodarone on zepatier.  Fibrosure is F4.     Review of Systems  Constitutional: Negative for appetite change and fatigue.  Gastrointestinal: Negative for diarrhea.  Skin: Negative for rash.  Neurological: Negative for dizziness.       Objective:   Physical Exam  Constitutional: He appears well-developed and well-nourished. No distress.  Eyes: No scleral icterus.  Cardiovascular: Normal rate, regular rhythm and normal heart sounds.   No murmur heard. Pulmonary/Chest: Effort normal and breath sounds normal. No respiratory distress.  Skin: No rash noted.          Assessment & Plan:
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
## 2015-10-05 NOTE — Assessment & Plan Note (Signed)
Doing better now, should be able to get through treatment.  RTC 6 weeks for end of treatment labs.

## 2015-10-05 NOTE — Assessment & Plan Note (Signed)
Will check elastography to compare and may need HCC screening going forward.

## 2015-10-05 NOTE — Addendum Note (Signed)
Encounter addended by: Chyrl Civatte, RN on: 10/05/2015  3:06 PM<BR>     Documentation filed: Dx Association, Patient Instructions Section, Orders

## 2015-10-05 NOTE — Patient Instructions (Signed)
Routine lab work today. Will notify you of abnormal results, otherwise no news is good news!  INCREASE Hydralazine to 50 mg three times daily.  Follow up 2-3 months with Dr. Gala Romney.  Do the following things EVERYDAY: 1) Weigh yourself in the morning before breakfast. Write it down and keep it in a log. 2) Take your medicines as prescribed 3) Eat low salt foods-Limit salt (sodium) to 2000 mg per day.  4) Stay as active as you can everyday 5) Limit all fluids for the day to less than 2 liters

## 2015-10-17 ENCOUNTER — Other Ambulatory Visit (HOSPITAL_COMMUNITY): Payer: Self-pay | Admitting: Student

## 2015-10-20 ENCOUNTER — Encounter (HOSPITAL_COMMUNITY): Payer: Self-pay

## 2015-10-20 NOTE — Progress Notes (Addendum)
Forms received on behalf of patient's disability claim from [facility]. Placed into interoffice mail for [employer] to complete. Copy of forms scanned into patient's electronic medical record. Flyer faxed to [facility] for any future forms/claims/med rec requests to be sent to [employer] directly.  [employer]

## 2015-11-03 ENCOUNTER — Telehealth (HOSPITAL_COMMUNITY): Payer: Self-pay | Admitting: *Deleted

## 2015-11-03 ENCOUNTER — Ambulatory Visit (HOSPITAL_COMMUNITY)
Admission: RE | Admit: 2015-11-03 | Discharge: 2015-11-03 | Disposition: A | Discharge status: Home / Self Care | Payer: Medicare HMO | Source: Ambulatory Visit | Attending: Internal Medicine | Admitting: Internal Medicine

## 2015-11-03 DIAGNOSIS — B182 Chronic viral hepatitis C: Secondary | ICD-10-CM | POA: Diagnosis present

## 2015-11-03 NOTE — Telephone Encounter (Signed)
Pt's wife called very concerned about pt, she states he has had chest pain twice and is much more SOB now.  She states little activity gets him very SOB, she states their bedroom is up about 13 steps and he has to stop after a couple of steps to rest and catch his breath and when he finally gets up them all he has to lay down and rest to catch his breath.  She states he also is much more tired and sleeps all the time, he has no energy and doesn't feel like doing anything.  She is very upset by all this and is crying on the phone concerned about him, however she states he denies any problems and doesn't want to make a big about it.  She states he has no edema and feels his wt is pretty stable.  Offered an appt for today but she states pt said he couldn't come in until Mon.  Sch appt w/Dr Bensimhon on Mon at 11:30, advised if gets worse can call 911 or take pt to ER

## 2015-11-07 ENCOUNTER — Ambulatory Visit (HOSPITAL_COMMUNITY)
Admission: RE | Admit: 2015-11-07 | Discharge: 2015-11-07 | Disposition: A | Discharge status: Home / Self Care | Payer: Medicare HMO | Source: Ambulatory Visit | Attending: Internal Medicine | Admitting: Internal Medicine

## 2015-11-07 ENCOUNTER — Encounter (HOSPITAL_COMMUNITY): Payer: Self-pay | Admitting: Internal Medicine

## 2015-11-07 VITALS — BP 96/50 | HR 83 | Wt 137.8 lb

## 2015-11-07 DIAGNOSIS — Z7901 Long term (current) use of anticoagulants: Secondary | ICD-10-CM | POA: Diagnosis not present

## 2015-11-07 DIAGNOSIS — Z888 Allergy status to other drugs, medicaments and biological substances status: Secondary | ICD-10-CM | POA: Diagnosis not present

## 2015-11-07 DIAGNOSIS — B1920 Unspecified viral hepatitis C without hepatic coma: Secondary | ICD-10-CM | POA: Diagnosis not present

## 2015-11-07 DIAGNOSIS — Z87891 Personal history of nicotine dependence: Secondary | ICD-10-CM | POA: Insufficient documentation

## 2015-11-07 DIAGNOSIS — I951 Orthostatic hypotension: Secondary | ICD-10-CM | POA: Insufficient documentation

## 2015-11-07 DIAGNOSIS — I110 Hypertensive heart disease with heart failure: Secondary | ICD-10-CM | POA: Diagnosis not present

## 2015-11-07 DIAGNOSIS — K5520 Angiodysplasia of colon without hemorrhage: Secondary | ICD-10-CM | POA: Insufficient documentation

## 2015-11-07 DIAGNOSIS — I4891 Unspecified atrial fibrillation: Secondary | ICD-10-CM | POA: Diagnosis not present

## 2015-11-07 DIAGNOSIS — I428 Other cardiomyopathies: Secondary | ICD-10-CM | POA: Diagnosis not present

## 2015-11-07 DIAGNOSIS — I5042 Chronic combined systolic (congestive) and diastolic (congestive) heart failure: Secondary | ICD-10-CM | POA: Insufficient documentation

## 2015-11-07 DIAGNOSIS — Z79899 Other long term (current) drug therapy: Secondary | ICD-10-CM | POA: Insufficient documentation

## 2015-11-07 DIAGNOSIS — M1990 Unspecified osteoarthritis, unspecified site: Secondary | ICD-10-CM | POA: Insufficient documentation

## 2015-11-07 LAB — BASIC METABOLIC PANEL
Anion gap: 6 (ref 5–15)
BUN: 17 mg/dL (ref 6–20)
CALCIUM: 9.1 mg/dL (ref 8.9–10.3)
CO2: 23 mmol/L (ref 22–32)
CREATININE: 1.11 mg/dL (ref 0.61–1.24)
Chloride: 109 mmol/L (ref 101–111)
GFR calc Af Amer: >60 mL/min (ref >60)
GFR calc non Af Amer: >60 mL/min (ref >60)
GLUCOSE: 127 mg/dL — AB (ref 65–99)
Potassium: 4.2 mmol/L (ref 3.5–5.1)
Sodium: 138 mmol/L (ref 135–145)

## 2015-11-07 LAB — CBC
HCT: 25 % — ABNORMAL LOW (ref 39.0–52.0)
Hemoglobin: 6.9 g/dL — CL (ref 13.0–17.0)
MCH: 17.6 pg — AB (ref 26.0–34.0)
MCHC: 27.6 g/dL — AB (ref 30.0–36.0)
MCV: 63.8 fL — AB (ref 78.0–100.0)
PLATELETS: 242 10*3/uL (ref 150–400)
RBC: 3.92 MIL/uL — ABNORMAL LOW (ref 4.22–5.81)
RDW: 19.4 % — AB (ref 11.5–15.5)
WBC: 3.3 10*3/uL — ABNORMAL LOW (ref 4.0–10.5)

## 2015-11-07 LAB — TSH: TSH: 0.827 u[IU]/mL (ref 0.350–4.500)

## 2015-11-07 LAB — DIGOXIN LEVEL: Digoxin Level: 1.2 ng/mL (ref 0.8–2.0)

## 2015-11-07 MED ORDER — HYDRALAZINE HCL 50 MG PO TABS: 25.0000 mg | ORAL_TABLET | Freq: Three times a day (TID) | ORAL | Status: DC
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
## 2015-11-07 NOTE — Patient Instructions (Signed)
Please add salt to your food.  Decrease Hydralazine to 25mg  three times daily.  Follow up in 2 weeks with Dr.Bensimhon  Routine lab work today. Will notify you of abnormal results

## 2015-11-07 NOTE — Addendum Note (Signed)
Encounter addended by: Modesta Messing, CMA on: 11/07/2015 12:18 PM<BR>     Documentation filed: Patient Instructions Section, Dx Association, Orders

## 2015-11-08 ENCOUNTER — Telehealth: Payer: Self-pay | Admitting: Internal Medicine

## 2015-11-08 ENCOUNTER — Other Ambulatory Visit (HOSPITAL_COMMUNITY): Payer: Self-pay | Admitting: *Deleted

## 2015-11-08 ENCOUNTER — Telehealth: Payer: Self-pay | Admitting: Gastroenterology

## 2015-11-08 ENCOUNTER — Telehealth (HOSPITAL_COMMUNITY): Payer: Self-pay | Admitting: *Deleted

## 2015-11-08 ENCOUNTER — Encounter (HOSPITAL_COMMUNITY)
Admission: RE | Admit: 2015-11-08 | Discharge: 2015-11-08 | Disposition: A | Discharge status: Home / Self Care | Payer: Medicare HMO | Source: Ambulatory Visit | Attending: Internal Medicine | Admitting: Internal Medicine

## 2015-11-08 DIAGNOSIS — D649 Anemia, unspecified: Secondary | ICD-10-CM | POA: Insufficient documentation

## 2015-11-08 DIAGNOSIS — R5383 Other fatigue: Secondary | ICD-10-CM

## 2015-11-08 LAB — PREPARE RBC (CROSSMATCH)

## 2015-11-08 NOTE — Telephone Encounter (Signed)
Dr. 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  He has had colonoscopy, endoscopy, and capsule endo in January.  He is being referred back.  According to the note on capsule colon, endo, and capsule were negative.  They are getting him blood tx today or tomorrow.  Does he need to come back here?

## 2015-11-08 NOTE — Telephone Encounter (Signed)
Notes Recorded by Noralee Space, RN on 11/08/2015 at 8:58 AM Pt sch for type and cross today at 11 and 2 u PRBC's tomorrow at 9 am, per Dr Gala Romney pt also needs referral to GI, order placed. Pt aware of all appt and verbalizes understanding. Notes Recorded by Noralee Space, RN on 11/07/2015 at 4:51 PM Per Dr Gala Romney, transfusion 2 u PRBC's, pt aware and agreeable, I will call him tomorrow w/time and d

## 2015-11-08 NOTE — Telephone Encounter (Signed)
A user error has taken place.

## 2015-11-08 NOTE — Telephone Encounter (Signed)
Dr. Leone Payor patient  Patient has a hemoglobin of 6.9 - please advise.

## 2015-11-08 NOTE — Telephone Encounter (Signed)
It is reasonable for me to see him but I may be able to talk to Dr. Gala Romney directly about it or message him. I suspect he never got iron tx and if that is the case I would do that first and then reassess So hold off on scheduling for right now

## 2015-11-09 ENCOUNTER — Encounter (HOSPITAL_COMMUNITY): Payer: Medicare HMO

## 2015-11-09 ENCOUNTER — Encounter (HOSPITAL_COMMUNITY)
Admission: RE | Admit: 2015-11-09 | Discharge: 2015-11-09 | Disposition: A | Discharge status: Home / Self Care | Payer: Medicare HMO | Source: Ambulatory Visit | Attending: Internal Medicine | Admitting: Internal Medicine

## 2015-11-09 VITALS — BP 131/52 | HR 66 | Temp 97.9°F | Resp 20 | Ht 68.0 in | Wt 137.0 lb

## 2015-11-09 DIAGNOSIS — R5383 Other fatigue: Secondary | ICD-10-CM

## 2015-11-09 DIAGNOSIS — Z9289 Personal history of other medical treatment: Secondary | ICD-10-CM

## 2015-11-09 DIAGNOSIS — D649 Anemia, unspecified: Secondary | ICD-10-CM | POA: Diagnosis not present

## 2015-11-09 HISTORY — DX: Personal history of other medical treatment: Z92.89

## 2015-11-09 MED ORDER — SODIUM CHLORIDE 0.9 % IV SOLN: Freq: Once | INTRAVENOUS | Status: DC

## 2015-11-10 LAB — TYPE AND SCREEN
ABO/RH(D): O POS
Antibody Screen: NEGATIVE
Unit division: 0
Unit division: 0

## 2015-11-18 ENCOUNTER — Ambulatory Visit (HOSPITAL_COMMUNITY)
Admission: RE | Admit: 2015-11-18 | Discharge: 2015-11-18 | Disposition: A | Discharge status: Home / Self Care | Payer: Medicare HMO | Source: Ambulatory Visit | Attending: Internal Medicine | Admitting: Internal Medicine

## 2015-11-18 ENCOUNTER — Other Ambulatory Visit: Payer: Self-pay | Admitting: Internal Medicine

## 2015-11-18 ENCOUNTER — Telehealth: Payer: Self-pay | Admitting: Internal Medicine

## 2015-11-18 DIAGNOSIS — K5520 Angiodysplasia of colon without hemorrhage: Secondary | ICD-10-CM

## 2015-11-18 DIAGNOSIS — D649 Anemia, unspecified: Secondary | ICD-10-CM

## 2015-11-18 DIAGNOSIS — I5042 Chronic combined systolic (congestive) and diastolic (congestive) heart failure: Secondary | ICD-10-CM

## 2015-11-18 DIAGNOSIS — R195 Other fecal abnormalities: Secondary | ICD-10-CM

## 2015-11-18 LAB — CBC
HCT: 31.3 % — ABNORMAL LOW (ref 39.0–52.0)
HEMOGLOBIN: 8.9 g/dL — AB (ref 13.0–17.0)
MCH: 20.3 pg — AB (ref 26.0–34.0)
MCHC: 28.4 g/dL — ABNORMAL LOW (ref 30.0–36.0)
MCV: 71.3 fL — AB (ref 78.0–100.0)
PLATELETS: 157 10*3/uL (ref 150–400)
RBC: 4.39 MIL/uL (ref 4.22–5.81)
RDW: 23.9 % — ABNORMAL HIGH (ref 11.5–15.5)
WBC: 4.7 10*3/uL (ref 4.0–10.5)

## 2015-11-18 NOTE — Telephone Encounter (Signed)
I have scheduled patient for push enteroscopy on Wednesday 11/23/15 @ 9:45 am [facility] with [place], case [id number]). Arrive at 8:15 am. NPO midnight. Patient has been advised to hold Eliquis 48 hours prior to procedure as well. He verbalizes understanding of time/date/location and prep for procedure.

## 2015-11-18 NOTE — Telephone Encounter (Signed)
Spoke to Dr. Gala Romney yesterday re: anemia and heme + stool  Plan for push enteroscopy by Dr. Rhea Belton - in winter 2017 ? AVM at 35 mins approx on capsule endoscopy and neg EGD, colonoscopy.  Having recurrent anemia and heme +  OK to hold Eliquis 48 hrs before enteroscopy  Dr. Rhea Belton to do in my absence

## 2015-11-21 ENCOUNTER — Encounter (HOSPITAL_COMMUNITY): Payer: Self-pay

## 2015-11-21 ENCOUNTER — Telehealth: Payer: Self-pay | Admitting: Internal Medicine

## 2015-11-21 NOTE — Progress Notes (Signed)
[employer] faxed request dated 10/06/15 for medical records 07/08/15-present from CHF clinic at [facility]. All available records from CHF clinic and providers faxed to provided # [phone] Copy of request scanned into patient's electronic medical record. RP ID # [id number] Claim ID # [id number]  [employer]

## 2015-11-21 NOTE — Telephone Encounter (Signed)
Left message for pt to call back  °

## 2015-11-22 ENCOUNTER — Encounter (HOSPITAL_COMMUNITY): Payer: Self-pay | Admitting: *Deleted

## 2015-11-22 ENCOUNTER — Encounter (HOSPITAL_COMMUNITY): Payer: Medicare HMO | Admitting: Internal Medicine

## 2015-11-22 NOTE — Telephone Encounter (Signed)
Spoke with pt and procedure discussed with pt. Prep reviewed with pt. Pt verbalized understanding.

## 2015-11-23 ENCOUNTER — Ambulatory Visit (HOSPITAL_COMMUNITY)
Admission: RE | Admit: 2015-11-23 | Discharge: 2015-11-23 | Disposition: A | Discharge status: Home / Self Care | Payer: Medicare HMO | Source: Ambulatory Visit | Attending: Internal Medicine | Admitting: Internal Medicine

## 2015-11-23 ENCOUNTER — Other Ambulatory Visit: Payer: Self-pay | Admitting: Internal Medicine

## 2015-11-23 ENCOUNTER — Ambulatory Visit (HOSPITAL_COMMUNITY): Payer: Medicare HMO | Admitting: Anesthesiology

## 2015-11-23 ENCOUNTER — Encounter (HOSPITAL_COMMUNITY)
Admission: RE | Disposition: A | Discharge status: Home / Self Care | Payer: Self-pay | Source: Ambulatory Visit | Attending: Internal Medicine

## 2015-11-23 ENCOUNTER — Encounter (HOSPITAL_COMMUNITY): Payer: Self-pay | Admitting: *Deleted

## 2015-11-23 DIAGNOSIS — Z87891 Personal history of nicotine dependence: Secondary | ICD-10-CM | POA: Diagnosis not present

## 2015-11-23 DIAGNOSIS — B1920 Unspecified viral hepatitis C without hepatic coma: Secondary | ICD-10-CM | POA: Diagnosis not present

## 2015-11-23 DIAGNOSIS — I4891 Unspecified atrial fibrillation: Secondary | ICD-10-CM | POA: Insufficient documentation

## 2015-11-23 DIAGNOSIS — M1990 Unspecified osteoarthritis, unspecified site: Secondary | ICD-10-CM | POA: Diagnosis not present

## 2015-11-23 DIAGNOSIS — D649 Anemia, unspecified: Secondary | ICD-10-CM | POA: Insufficient documentation

## 2015-11-23 DIAGNOSIS — I5043 Acute on chronic combined systolic (congestive) and diastolic (congestive) heart failure: Secondary | ICD-10-CM | POA: Diagnosis not present

## 2015-11-23 DIAGNOSIS — Z7902 Long term (current) use of antithrombotics/antiplatelets: Secondary | ICD-10-CM | POA: Insufficient documentation

## 2015-11-23 DIAGNOSIS — R195 Other fecal abnormalities: Secondary | ICD-10-CM | POA: Insufficient documentation

## 2015-11-23 DIAGNOSIS — R933 Abnormal findings on diagnostic imaging of other parts of digestive tract: Secondary | ICD-10-CM | POA: Diagnosis not present

## 2015-11-23 DIAGNOSIS — Z91030 Bee allergy status: Secondary | ICD-10-CM | POA: Insufficient documentation

## 2015-11-23 DIAGNOSIS — K5520 Angiodysplasia of colon without hemorrhage: Secondary | ICD-10-CM

## 2015-11-23 DIAGNOSIS — Z888 Allergy status to other drugs, medicaments and biological substances status: Secondary | ICD-10-CM | POA: Insufficient documentation

## 2015-11-23 DIAGNOSIS — I10 Essential (primary) hypertension: Secondary | ICD-10-CM | POA: Diagnosis not present

## 2015-11-23 HISTORY — DX: Personal history of urinary calculi: Z87.442

## 2015-11-23 HISTORY — DX: Inflammatory liver disease, unspecified: K75.9

## 2015-11-23 HISTORY — PX: ENTEROSCOPY: SHX5533

## 2015-11-23 HISTORY — DX: Reserved for inherently not codable concepts without codable children: IMO0001

## 2015-11-23 HISTORY — DX: Personal history of other medical treatment: Z92.89

## 2015-11-23 HISTORY — DX: Pneumonia, unspecified organism: J18.9

## 2015-11-23 HISTORY — DX: Cardiac arrhythmia, unspecified: I49.9

## 2015-11-23 SURGERY — ENTEROSCOPY
Anesthesia: Monitor Anesthesia Care

## 2015-11-23 MED ORDER — LACTATED RINGERS IV SOLN
INTRAVENOUS | Status: DC | PRN
  Administered 2015-11-23: 10:00:00 via INTRAVENOUS

## 2015-11-23 MED ORDER — BUTAMBEN-TETRACAINE-BENZOCAINE 2-2-14 % EX AERO
INHALATION_SPRAY | CUTANEOUS | Status: DC | PRN
  Administered 2015-11-23: 1 via TOPICAL

## 2015-11-23 MED ORDER — LACTATED RINGERS IV SOLN
INTRAVENOUS | Status: DC
  Administered 2015-11-23: 09:00:00 via INTRAVENOUS

## 2015-11-23 MED ORDER — LIDOCAINE HCL (CARDIAC) 10 MG/ML IV SOLN
INTRAVENOUS | Status: DC | PRN
  Administered 2015-11-23: 50 mg via INTRAVENOUS

## 2015-11-23 MED ORDER — SODIUM CHLORIDE 0.9 % IV SOLN: INTRAVENOUS | Status: DC

## 2015-11-23 MED ORDER — PROPOFOL 500 MG/50ML IV EMUL
INTRAVENOUS | Status: DC | PRN
  Administered 2015-11-23: 100 ug/kg/min via INTRAVENOUS

## 2015-11-23 NOTE — Op Note (Addendum)
[facility] Patient Name: Dale [name] Procedure Date : 11/23/2015 MRN: [id number] Attending MD: Beverley Fiedler , MD Date of Birth: Feb 10, 1946 CSN: [id number] Age: 70 Admit Type: Outpatient Procedure:                Small bowel enteroscopy Indications:              Abnormal video capsule endoscopy, Occult blood in                            stool Providers:                Carie Caddy. Rhea Belton, MD, Dwain Sarna, RN, Oletha Blend, Technician, Gwenyth Allegra CRNA, CRNA Referring MD:             Bevelyn Buckles. Bensimhom, MD Medicines:                Monitored Anesthesia Care Complications:            No immediate complications. Estimated Blood Loss:     Estimated blood loss: none. Procedure:                Pre-Anesthesia Assessment:                           - Prior to the procedure, a History and Physical                            was performed, and patient medications and                            allergies were reviewed. The patient's tolerance of                            previous anesthesia was also reviewed. The risks                            and benefits of the procedure and the sedation                            options and risks were discussed with the patient.                            All questions were answered, and informed consent                            was obtained. Prior Anticoagulants: The patient has                            taken Eliquis (apixaban), last dose was 2 days                            prior to procedure. ASA Grade Assessment: III - A  patient with severe systemic disease. After                            reviewing the risks and benefits, the patient was                            deemed in satisfactory condition to undergo the                            procedure.                           After obtaining informed consent, the endoscope was                            passed under direct  vision. Throughout the                            procedure, the patient's blood pressure, pulse, and                            oxygen saturations were monitored continuously. The                            EC-[id number] ([id number]) scope was introduced through                            the mouth and advanced to the proximal jejunum. The                            small bowel enteroscopy was accomplished without                            difficulty. The patient tolerated the procedure                            well. Scope In: Scope Out: Findings:      The esophagus was normal.      The stomach was normal.      There was no evidence of significant pathology in the entire examined       duodenum.      There was no evidence of significant pathology in the proximal jejunum. Impression:               - Normal esophagus.                           - Normal stomach.                           - Normal examined duodenum.                           - The examined portion of the jejunum was normal.                           -  No specimens collected. Moderate Sedation:      N/A Recommendation:           - Patient has a contact number available for                            emergencies. The signs and symptoms of potential                            delayed complications were discussed with the                            patient. Return to normal activities tomorrow.                            Written discharge instructions were provided to the                            patient.                           - Resume previous diet.                           - Continue present medications.                           - Resume Eliquis (apixaban) at prior dose tomorrow.                           - Given persistent heme positive stools and                            negative push enteroscopy today I recommend                            repeating the video capsule endoscopy. If evidence                             of mid or distal small bowel bleeding source, then                            patient would be appropriate for deep enteroscopy                            at [facility] or [facility]. Procedure Code(s):        --- Professional ---                           [phone], Small intestinal endoscopy, enteroscopy                            beyond second portion of duodenum, not including                            ileum; diagnostic, including collection of  specimen(s) by brushing or washing, when performed                            (separate procedure) Diagnosis Code(s):        --- Professional ---                           R19.5, Other fecal abnormalities                           R93.3, Abnormal findings on diagnostic imaging of                            other parts of digestive tract CPT copyright 2016 American Medical Association. All rights reserved. The codes documented in this report are preliminary and upon coder review may  be revised to meet current compliance requirements. Beverley Fiedler, MD 11/23/2015 11:09:58 AM This report has been signed electronically. Number of Addenda: 0
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
## 2015-11-23 NOTE — Transfer of Care (Signed)
Immediate Anesthesia Transfer of Care Note  Patient: Dale [name]  Procedure(s) Performed: Procedure(s): ENTEROSCOPY (N/A)  Patient Location: PACU  Anesthesia Type:MAC  Level of Consciousness: awake, alert  and oriented  Airway & Oxygen Therapy: Patient Spontanous Breathing and Patient connected to nasal cannula oxygen  Post-op Assessment: Report given to RN and Post -op Vital signs reviewed and stable  Post vital signs: Reviewed and stable  Last Vitals:  Filed Vitals:   11/23/15 1110 11/23/15 1120  BP: 174/65 171/76  Pulse: 64 62  Temp:    Resp: 14 15    Last Pain: There were no vitals filed for this visit.       Complications: No apparent anesthesia complications

## 2015-11-23 NOTE — Discharge Instructions (Signed)

## 2015-11-23 NOTE — Anesthesia Procedure Notes (Addendum)
Procedure Name: MAC Date/Time: 11/23/2015 10:05 AM Performed by: Gwenyth Allegra Pre-anesthesia Checklist: Patient identified, Timeout performed, Emergency Drugs available, Suction available and Patient being monitored Patient Re-evaluated:Patient Re-evaluated prior to inductionOxygen Delivery Method: Nasal cannula Preoxygenation: Pre-oxygenation with 100% oxygen Intubation Type: IV induction   Procedure Name: MAC Date/Time: 11/23/2015 10:05 AM Performed by: Gwenyth Allegra Pre-anesthesia Checklist: Patient identified, Timeout performed, Emergency Drugs available, Suction available and Patient being monitored Patient Re-evaluated:Patient Re-evaluated prior to inductionOxygen Delivery Method: Nasal cannula Preoxygenation: Pre-oxygenation with 100% oxygen Intubation Type: IV induction

## 2015-11-23 NOTE — Anesthesia Preprocedure Evaluation (Addendum)
Anesthesia Evaluation  Patient identified by MRN, date of birth, ID band Patient awake    Reviewed: Allergy & Precautions, NPO status , Patient's Chart, lab work & pertinent test results  History of Anesthesia Complications Negative for: history of anesthetic complications  Airway Mallampati: II  TM Distance: >3 FB Neck ROM: Full    Dental  (+) Edentulous Upper   Pulmonary shortness of breath, former smoker,    breath sounds clear to auscultation       Cardiovascular hypertension, +CHF  + dysrhythmias  Rhythm:Regular Rate:Normal  EF 15-20%   Neuro/Psych    GI/Hepatic (+) Hepatitis -, C  Endo/Other    Renal/GU      Musculoskeletal  (+) Arthritis ,   Abdominal   Peds  Hematology   Anesthesia Other Findings   Reproductive/Obstetrics                            Anesthesia Physical Anesthesia Plan  ASA: IV  Anesthesia Plan: MAC   Post-op Pain Management:    Induction: Intravenous  Airway Management Planned: Natural Airway  Additional Equipment:   Intra-op Plan:   Post-operative Plan: Possible Post-op intubation/ventilation  Informed Consent: I have reviewed the patients History and Physical, chart, labs and discussed the procedure including the risks, benefits and alternatives for the proposed anesthesia with the patient or authorized representative who has indicated his/her understanding and acceptance.     Plan Discussed with: CRNA  Anesthesia Plan Comments:         Anesthesia Quick Evaluation

## 2015-11-24 ENCOUNTER — Encounter (HOSPITAL_COMMUNITY): Payer: Self-pay | Admitting: Internal Medicine

## 2015-11-25 NOTE — Anesthesia Postprocedure Evaluation (Signed)
Anesthesia Post Note  Patient: Dale [name]  Procedure(s) Performed: Procedure(s) (LRB): ENTEROSCOPY (N/A)  Patient location during evaluation: Endoscopy Anesthesia Type: MAC Level of consciousness: awake and alert Pain management: pain level controlled Vital Signs Assessment: post-procedure vital signs reviewed and stable Respiratory status: spontaneous breathing, nonlabored ventilation, respiratory function stable and patient connected to nasal cannula oxygen Cardiovascular status: stable and blood pressure returned to baseline Anesthetic complications: no    Last Vitals:  Filed Vitals:   11/23/15 1110 11/23/15 1120  BP: 174/65 171/76  Pulse: 64 62  Temp:    Resp: 14 15    Last Pain: There were no vitals filed for this visit.               Wakisha Alberts,JAMES TERRILL

## 2015-11-26 NOTE — Anesthesia Postprocedure Evaluation (Signed)
Anesthesia Post Note  Patient: Dale [name]  Procedure(s) Performed: Procedure(s) (LRB): ENTEROSCOPY (N/A)  Patient location during evaluation: Endoscopy Anesthesia Type: MAC Level of consciousness: awake and alert Pain management: pain level controlled Vital Signs Assessment: post-procedure vital signs reviewed and stable Respiratory status: spontaneous breathing, nonlabored ventilation, respiratory function stable and patient connected to nasal cannula oxygen Cardiovascular status: stable and blood pressure returned to baseline Anesthetic complications: no    Last Vitals:  Filed Vitals:   11/23/15 1110 11/23/15 1120  BP: 174/65 171/76  Pulse: 64 62  Temp:    Resp: 14 15    Last Pain: There were no vitals filed for this visit.               Geriann Lafont,JAMES TERRILL      

## 2015-11-26 NOTE — Addendum Note (Signed)
Addendum  created 11/26/15 0945 by Sharee Holster, MD   Modules edited: Clinical Notes   Clinical Notes:  File: [id number]

## 2015-11-30 ENCOUNTER — Telehealth: Payer: Self-pay

## 2015-11-30 NOTE — Telephone Encounter (Signed)
Okay I'm going to notify Dr. Leone Payor and Dr.Bensimhon

## 2015-11-30 NOTE — Telephone Encounter (Signed)
Called pt and spoke with him about scheduling capsule endo. Pt states he does not want to have this test done, states that we keep running all of these tests and don't find anything and he keeps getting more bills. Pt states he cannot do this test and does not want to schedule it at this time. Dr. Rhea Belton notified.

## 2015-12-01 ENCOUNTER — Encounter (HOSPITAL_COMMUNITY): Payer: Self-pay

## 2015-12-01 NOTE — Progress Notes (Signed)
[employer] faxed medical record request dated 11/24/15 for all records 05/05/2015-present. All available records (>100 pages) from our office/providers mailed to provided address: [employer] [place] [employer] [address] [place], [place] [address]  Copy of request scanned into patient's electronic medical record. Claim # [id number]  Ave Filter

## 2015-12-05 ENCOUNTER — Encounter (HOSPITAL_COMMUNITY): Payer: Self-pay

## 2015-12-05 NOTE — Progress Notes (Signed)
[employer] faxed request for status update on 12/04/15 regarding recent disability claim originally sent 11/24/15. Return faxed request for status to confirm that paperwork had been mailed to provided address. Copy of request for status update scanned into patient's electronic medical record.  Ave Filter

## 2015-12-07 ENCOUNTER — Telehealth (HOSPITAL_COMMUNITY): Payer: Self-pay

## 2015-12-07 NOTE — Telephone Encounter (Signed)
Wife called to report patient due to be seen and not feeling well.  Swollen, SOB without exertion, fatigued, weak. Added on to an opening with NP CHF clinic tomorrow at 10. Wife aware.  Ave Filter

## 2015-12-08 ENCOUNTER — Ambulatory Visit (HOSPITAL_COMMUNITY)
Admission: RE | Admit: 2015-12-08 | Discharge: 2015-12-08 | Disposition: A | Discharge status: Home / Self Care | Payer: Medicare HMO | Source: Ambulatory Visit | Attending: Internal Medicine | Admitting: Internal Medicine

## 2015-12-08 VITALS — BP 130/58 | HR 77 | Wt 140.6 lb

## 2015-12-08 DIAGNOSIS — B1920 Unspecified viral hepatitis C without hepatic coma: Secondary | ICD-10-CM | POA: Diagnosis not present

## 2015-12-08 DIAGNOSIS — Z87442 Personal history of urinary calculi: Secondary | ICD-10-CM | POA: Diagnosis not present

## 2015-12-08 DIAGNOSIS — I480 Paroxysmal atrial fibrillation: Secondary | ICD-10-CM | POA: Diagnosis not present

## 2015-12-08 DIAGNOSIS — I5042 Chronic combined systolic (congestive) and diastolic (congestive) heart failure: Secondary | ICD-10-CM | POA: Diagnosis present

## 2015-12-08 DIAGNOSIS — R0602 Shortness of breath: Secondary | ICD-10-CM | POA: Insufficient documentation

## 2015-12-08 DIAGNOSIS — Z7901 Long term (current) use of anticoagulants: Secondary | ICD-10-CM | POA: Diagnosis not present

## 2015-12-08 DIAGNOSIS — Z87891 Personal history of nicotine dependence: Secondary | ICD-10-CM | POA: Insufficient documentation

## 2015-12-08 DIAGNOSIS — Z87828 Personal history of other (healed) physical injury and trauma: Secondary | ICD-10-CM | POA: Insufficient documentation

## 2015-12-08 DIAGNOSIS — D509 Iron deficiency anemia, unspecified: Secondary | ICD-10-CM

## 2015-12-08 DIAGNOSIS — Z8701 Personal history of pneumonia (recurrent): Secondary | ICD-10-CM | POA: Diagnosis not present

## 2015-12-08 DIAGNOSIS — Z91030 Bee allergy status: Secondary | ICD-10-CM | POA: Diagnosis not present

## 2015-12-08 DIAGNOSIS — I5043 Acute on chronic combined systolic (congestive) and diastolic (congestive) heart failure: Secondary | ICD-10-CM | POA: Diagnosis not present

## 2015-12-08 DIAGNOSIS — Z9889 Other specified postprocedural states: Secondary | ICD-10-CM | POA: Insufficient documentation

## 2015-12-08 DIAGNOSIS — I951 Orthostatic hypotension: Secondary | ICD-10-CM | POA: Diagnosis not present

## 2015-12-08 DIAGNOSIS — Z888 Allergy status to other drugs, medicaments and biological substances status: Secondary | ICD-10-CM | POA: Diagnosis not present

## 2015-12-08 DIAGNOSIS — I110 Hypertensive heart disease with heart failure: Secondary | ICD-10-CM | POA: Diagnosis not present

## 2015-12-08 DIAGNOSIS — I428 Other cardiomyopathies: Secondary | ICD-10-CM

## 2015-12-08 DIAGNOSIS — I429 Cardiomyopathy, unspecified: Secondary | ICD-10-CM | POA: Diagnosis not present

## 2015-12-08 DIAGNOSIS — M1990 Unspecified osteoarthritis, unspecified site: Secondary | ICD-10-CM | POA: Diagnosis not present

## 2015-12-08 LAB — BASIC METABOLIC PANEL
ANION GAP: 4 — AB (ref 5–15)
BUN: 11 mg/dL (ref 6–20)
CALCIUM: 9.1 mg/dL (ref 8.9–10.3)
CO2: 25 mmol/L (ref 22–32)
Chloride: 110 mmol/L (ref 101–111)
Creatinine, Ser: 0.88 mg/dL (ref 0.61–1.24)
GLUCOSE: 87 mg/dL (ref 65–99)
POTASSIUM: 4.3 mmol/L (ref 3.5–5.1)
Sodium: 139 mmol/L (ref 135–145)

## 2015-12-08 LAB — CBC
HEMATOCRIT: 31 % — AB (ref 39.0–52.0)
Hemoglobin: 9 g/dL — ABNORMAL LOW (ref 13.0–17.0)
MCH: 21.5 pg — ABNORMAL LOW (ref 26.0–34.0)
MCHC: 29 g/dL — ABNORMAL LOW (ref 30.0–36.0)
MCV: 74 fL — AB (ref 78.0–100.0)
Platelets: 182 10*3/uL (ref 150–400)
RBC: 4.19 MIL/uL — AB (ref 4.22–5.81)
RDW: 23.2 % — ABNORMAL HIGH (ref 11.5–15.5)
WBC: 4.7 10*3/uL (ref 4.0–10.5)

## 2015-12-08 MED ORDER — SPIRONOLACTONE 25 MG PO TABS: 25.0000 mg | ORAL_TABLET | Freq: Every day | ORAL | Status: DC

## 2015-12-08 NOTE — Patient Instructions (Signed)
INCREASE Spironolactone to 25 mg, one tab daily  Labs today  Your physician recommends that you schedule a follow-up appointment in: 4-6 weeks with Dr Gala Romney  Do the following things EVERYDAY: 1) Weigh yourself in the morning before breakfast. Write it down and keep it in a log. 2) Take your medicines as prescribed 3) Eat low salt foods-Limit salt (sodium) to 2000 mg per day.  4) Stay as active as you can everyday 5) Limit all fluids for the day to less than 2 liters 6)

## 2015-12-08 NOTE — Progress Notes (Signed)
Advanced Heart Failure Medication Review by a Pharmacist  Does the patient  feel that his/her medications are working for him/her?  yes  Has the patient been experiencing any side effects to the medications prescribed?  no  Does the patient measure his/her own blood pressure or blood glucose at home?  no   Does the patient have any problems obtaining medications due to transportation or finances?   no  Understanding of regimen: good Understanding of indications: good Potential of compliance: good Patient understands to avoid NSAIDs. Patient understands to avoid decongestants.  Issues to address at subsequent visits: None   Pharmacist comments:  Dale [name] is a pleasant 70 yo M presenting with his wife and without a medication list but with good recall of his regimen. He reports good compliance with his regimen but he states that he cut down on his hydralazine to 25 mg (1/2 of a 50 mg tab) TID but his wife thinks he is still taking a whole 50 mg TID. No other medication-related questions or concerns for me at this time.   Dale [name]. Dale [name], PharmD, BCPS, CPP Clinical Pharmacist Pager: [phone] Phone: [phone] 12/08/2015 10:27 AM      Time with patient: 8 minutes Preparation and documentation time: 2 minutes Total time: 10 minutes

## 2015-12-08 NOTE — Progress Notes (Signed)
FMLA forms for patient's employer [employer]) completed by patient and wife and signed by Dr. Gala Romney. Original copy given back to patient during OV today, copy scanned into patient's electronic medical record.  Dale [name]
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
## 2015-12-09 ENCOUNTER — Telehealth: Payer: Self-pay | Admitting: Licensed Clinical Social Worker

## 2015-12-09 NOTE — Telephone Encounter (Signed)
CSW referred to assist wife with support. CSW spoke with wife via phone to offer support. Wife became tearful and spoke at length about patient's decline. Wife states that he gets "so SOB just walking out to the porch". Wife stated "I am not doing good". Wife states that patient gets mad at her if she "tells what is happening" to the medical staff. She states he says "everything is fine and it's not". Wife appears overwhelmed with patient's decline and asked if "we can have an open discussion with the staff" about his current condition and the plan. CSW offered support and listening to wife. CSW will follow up with staff. CSW will be out of town and wife states this can wait until next clinic visit. CSW will follow up with wife upon return on 12/19/15. Wife appeared grateful for listening and support. CSW suggested she call ahead of clinic appointments and give feedback to medical staff prior to visit if needed. CSW continues to follow as needed. Lasandra Beech, LCSW [phone]

## 2015-12-15 ENCOUNTER — Ambulatory Visit (INDEPENDENT_AMBULATORY_CARE_PROVIDER_SITE_OTHER): Payer: Medicare HMO | Admitting: Internal Medicine

## 2015-12-15 ENCOUNTER — Encounter: Payer: Self-pay | Admitting: Internal Medicine

## 2015-12-15 VITALS — BP 122/65 | HR 86 | Temp 97.9°F | Ht 68.0 in | Wt 141.0 lb

## 2015-12-15 DIAGNOSIS — I429 Cardiomyopathy, unspecified: Secondary | ICD-10-CM

## 2015-12-15 DIAGNOSIS — K7400 Hepatic fibrosis, unspecified: Secondary | ICD-10-CM

## 2015-12-15 DIAGNOSIS — B182 Chronic viral hepatitis C: Secondary | ICD-10-CM

## 2015-12-15 DIAGNOSIS — I428 Other cardiomyopathies: Secondary | ICD-10-CM

## 2015-12-15 NOTE — Assessment & Plan Note (Signed)
Completed treatment.  Will check viral load today and again in 4 months to confirm cure.

## 2015-12-15 NOTE — Assessment & Plan Note (Signed)
Not consistent with active cirrhosis with labs and ultrasound.  Will not need continued HCC surveillance.

## 2015-12-15 NOTE — Assessment & Plan Note (Signed)
Has been off of amiodarone for hep C medication interaction.  Will let Dr. Gala Romney know in case he wants to restart.
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
## 2015-12-16 LAB — HEPATITIS C RNA QUANTITATIVE: HCV Quantitative: NOT DETECTED IU/mL (ref <15)

## 2015-12-20 ENCOUNTER — Telehealth: Payer: Self-pay | Admitting: Licensed Clinical Social Worker

## 2015-12-20 NOTE — Telephone Encounter (Signed)
CSW attempted to reach wife for supportive call although patient answered the phone. Patient reports he is doing well and hopeful to return to cardiac rehab. CSW provided support and unable to speak with wife at this time. CSW continues to be available as needed. Lasandra Beech, LCSW [phone]

## 2016-01-14 ENCOUNTER — Other Ambulatory Visit (HOSPITAL_COMMUNITY): Payer: Self-pay | Admitting: Internal Medicine

## 2016-01-23 ENCOUNTER — Ambulatory Visit (HOSPITAL_COMMUNITY)
Admission: RE | Admit: 2016-01-23 | Discharge: 2016-01-23 | Disposition: A | Discharge status: Home / Self Care | Payer: Medicare HMO | Source: Ambulatory Visit | Attending: Internal Medicine | Admitting: Internal Medicine

## 2016-01-23 VITALS — BP 126/58 | HR 88 | Wt 144.6 lb

## 2016-01-23 DIAGNOSIS — B182 Chronic viral hepatitis C: Secondary | ICD-10-CM

## 2016-01-23 DIAGNOSIS — I480 Paroxysmal atrial fibrillation: Secondary | ICD-10-CM | POA: Diagnosis not present

## 2016-01-23 DIAGNOSIS — I10 Essential (primary) hypertension: Secondary | ICD-10-CM | POA: Diagnosis not present

## 2016-01-23 DIAGNOSIS — W3400XS Accidental discharge from unspecified firearms or gun, sequela: Secondary | ICD-10-CM

## 2016-01-23 DIAGNOSIS — I5042 Chronic combined systolic (congestive) and diastolic (congestive) heart failure: Secondary | ICD-10-CM | POA: Diagnosis not present

## 2016-01-23 DIAGNOSIS — I428 Other cardiomyopathies: Secondary | ICD-10-CM

## 2016-01-23 DIAGNOSIS — S21309S Unspecified open wound of unspecified front wall of thorax with penetration into thoracic cavity, sequela: Secondary | ICD-10-CM | POA: Diagnosis not present

## 2016-01-23 DIAGNOSIS — I429 Cardiomyopathy, unspecified: Secondary | ICD-10-CM

## 2016-01-23 DIAGNOSIS — S21339S Puncture wound without foreign body of unspecified front wall of thorax with penetration into thoracic cavity, sequela: Secondary | ICD-10-CM

## 2016-01-23 LAB — CBC
HCT: 32.4 % — ABNORMAL LOW (ref 39.0–52.0)
Hemoglobin: 9.3 g/dL — ABNORMAL LOW (ref 13.0–17.0)
MCH: 22.6 pg — ABNORMAL LOW (ref 26.0–34.0)
MCHC: 28.7 g/dL — AB (ref 30.0–36.0)
MCV: 78.6 fL (ref 78.0–100.0)
Platelets: 140 10*3/uL — ABNORMAL LOW (ref 150–400)
RBC: 4.12 MIL/uL — ABNORMAL LOW (ref 4.22–5.81)
RDW: 18.6 % — AB (ref 11.5–15.5)
WBC: 5 10*3/uL (ref 4.0–10.5)

## 2016-01-23 LAB — BASIC METABOLIC PANEL
Anion gap: 4 — ABNORMAL LOW (ref 5–15)
BUN: 16 mg/dL (ref 6–20)
CHLORIDE: 113 mmol/L — AB (ref 101–111)
CO2: 25 mmol/L (ref 22–32)
CREATININE: 1.01 mg/dL (ref 0.61–1.24)
Calcium: 8.9 mg/dL (ref 8.9–10.3)
GFR calc Af Amer: >60 mL/min (ref >60)
GLUCOSE: 107 mg/dL — AB (ref 65–99)
Potassium: 4 mmol/L (ref 3.5–5.1)
SODIUM: 142 mmol/L (ref 135–145)

## 2016-01-23 LAB — BRAIN NATRIURETIC PEPTIDE: B NATRIURETIC PEPTIDE 5: 59.8 pg/mL (ref 0.0–100.0)

## 2016-01-23 MED ORDER — LOSARTAN POTASSIUM 25 MG PO TABS: 12.5000 mg | ORAL_TABLET | Freq: Every day | ORAL | 5 refills | Status: DC
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
## 2016-01-23 NOTE — Patient Instructions (Signed)
Labs today  Your physician wants you to follow-up in: 6-8 weeks with Dr Gala Romney and echo.  Your physician has requested that you have an echocardiogram. Echocardiography is a painless test that uses sound waves to create images of your heart. It provides your doctor with information about the size and shape of your heart and how well your heart's chambers and valves are working. This procedure takes approximately one hour. There are no restrictions for this procedure.  You have been referred to [facility] cardiac rehab, they will contact you for orientation

## 2016-01-25 ENCOUNTER — Telehealth (HOSPITAL_COMMUNITY): Payer: Self-pay

## 2016-01-25 NOTE — Telephone Encounter (Signed)
Reviewed stable lab results with patient per Dr. Alford Highland request. Patient states he is feeling better and stronger and feels ready to start cardiac rehab. Per [facility]'s last OV note, patient is stable from cardiac perspective to start this "when he felt ready". Will send in CR referral for [facility] per patient request.  Ave Filter, RN

## 2016-01-26 ENCOUNTER — Telehealth: Payer: Self-pay | Admitting: Licensed Clinical Social Worker

## 2016-01-26 NOTE — Telephone Encounter (Signed)
CSW contacted patient's wife per her request. Wife spoke at length about frustrations and concerns about patient and his recent decline. Wife states that patient has been increasingly more depressed and more forgetful. Wife reports patient gets frustrated with her when she attempts to help him or provide support. She states that his "man pride" gets in the way. She feels like he does not "tell how he is feeling" to providers when seen in the clinic. Wife appears to be feeling overwhelmed with recent decline stating concerns of "losing him" and fears continued decline. Patient approved for cardiac rehab and wife requesting number to make arrangements to start program. Wife also reports she started seeing a therapist to assist with coping strategies and support with patient's illness. Wife also asking about support groups to help with adjustment to patient's illness. CSW provided requested numbers and mailed flyer for upcoming caregiver support group series. CSW also provided some coping strategies to assist wife in supporting patient. Wife unable to share concerns during clinic appointment as she does not want patient to know she reached out for assistance with CSW. Wife appears grateful for assistance and will return call to CSW if further needs arise. CSW continues to be available through HF Clinic. Lasandra Beech, LCSW [phone]

## 2016-01-31 ENCOUNTER — Other Ambulatory Visit (HOSPITAL_COMMUNITY): Payer: Self-pay | Admitting: Student

## 2016-01-31 ENCOUNTER — Other Ambulatory Visit (HOSPITAL_COMMUNITY): Payer: Self-pay | Admitting: Internal Medicine

## 2016-02-02 ENCOUNTER — Telehealth (HOSPITAL_COMMUNITY): Payer: Self-pay

## 2016-02-02 NOTE — Telephone Encounter (Signed)
Patient's wife calling to see if patient can take allergy medication for sniffles. Advised ok to take claritin, zyrtec, or benadryl per clinical pharmacist Cicero Duck. Wife does endorse patient very weak after helping move a family member.  States he only lifted light stuff but is worn out and sore all over. Wife will call CHF clinic in am to update [place] on patient status.  If still weak and fatigued, advised may need to come in to PCP office or urgent care to be evaluated.  Ave Filter, RN

## 2016-02-09 ENCOUNTER — Other Ambulatory Visit (HOSPITAL_COMMUNITY): Payer: Self-pay | Admitting: Internal Medicine

## 2016-02-09 ENCOUNTER — Other Ambulatory Visit (HOSPITAL_COMMUNITY): Payer: Self-pay | Admitting: Student

## 2016-02-14 DIAGNOSIS — Z736 Limitation of activities due to disability: Secondary | ICD-10-CM

## 2016-02-16 ENCOUNTER — Encounter (HOSPITAL_COMMUNITY): Payer: Self-pay

## 2016-02-16 ENCOUNTER — Encounter (HOSPITAL_COMMUNITY)
Admission: RE | Admit: 2016-02-16 | Discharge: 2016-02-16 | Disposition: A | Discharge status: Home / Self Care | Payer: Medicare HMO | Source: Ambulatory Visit | Attending: Internal Medicine | Admitting: Internal Medicine

## 2016-02-16 VITALS — BP 98/60 | HR 62 | Ht 68.0 in | Wt 144.2 lb

## 2016-02-16 DIAGNOSIS — I5022 Chronic systolic (congestive) heart failure: Secondary | ICD-10-CM | POA: Diagnosis not present

## 2016-02-16 DIAGNOSIS — I5042 Chronic combined systolic (congestive) and diastolic (congestive) heart failure: Secondary | ICD-10-CM

## 2016-02-16 NOTE — Progress Notes (Signed)
Cardiac Rehab Medication Review by a Pharmacist  Does the patient  feel that his/her medications are working for him/her?  yes  Has the patient been experiencing any side effects to the medications prescribed?  no  Does the patient measure his/her own blood pressure or blood glucose at home?  no   Does the patient have any problems obtaining medications due to transportation or finances?   no  Understanding of regimen: fair Understanding of indications: fair Potential of compliance: good    Pharmacist comments: Pt reports that he previously measured his blood pressure but the cuff broke, so he is working on obtaining a new one. Pt also reports congestion at night. Pt recently prescribed cetirizine by MD - recommended they begin a trial of this and see if it helps. Also had question regarding iron supplementation, advised pt that there are a number of OTC options available, but they should verify with MD first that iron supplementation is needed. No other questions or issues noted.   Fredonia Highland, PharmD PGY-1 Pharmacy Resident Pager: [phone]
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
## 2016-02-20 ENCOUNTER — Encounter (HOSPITAL_COMMUNITY)
Admission: RE | Admit: 2016-02-20 | Discharge: 2016-02-20 | Disposition: A | Discharge status: Home / Self Care | Payer: Medicare HMO | Source: Ambulatory Visit | Attending: Internal Medicine | Admitting: Internal Medicine

## 2016-02-20 DIAGNOSIS — I5042 Chronic combined systolic (congestive) and diastolic (congestive) heart failure: Secondary | ICD-10-CM

## 2016-02-20 DIAGNOSIS — I5022 Chronic systolic (congestive) heart failure: Secondary | ICD-10-CM | POA: Diagnosis not present

## 2016-02-20 NOTE — Progress Notes (Signed)
Daily Session Note  Patient Details  Name: Dale [name] MRN: [id number] Date of Birth: 06/25/1945 Referring Provider:   Flowsheet Row CARDIAC REHAB PHASE II ORIENTATION from 02/16/2016 in [place]  Referring Provider  Glori Bickers MD      Encounter Date: 02/20/2016  Check In:   Capillary Blood Glucose: No results found for this or any previous visit (from the past 24 hour(s)).   Goals Met:  Personal goals reviewed  Goals Unmet:  Not Applicable  Comments: Pt started cardiac rehab today.  Pt tolerated light exercise and is somewhat decondtioned. Dale [name] wife was present VSS, telemetry-Sinus Rhythm with ST depression and PVC's, asymptomatic.  Medication list reconciled. Pt denies barriers to medicaiton compliance.  PSYCHOSOCIAL ASSESSMENT:  PHQ-0 but the patient's wife thinks Dale [name] is depressed although the patient denies being depressed, hopeful outlook with supportive family. Will check with the patient and his wife to see if they would benefit from counselling    Pt enjoys caring for his three horses.   Pt oriented to exercise equipment and routine.  Dale [name] did complain of feeling fatigued after walking around the track and reported feeling a little lightheaded this resolved with rest. Vital signs were stable today  Understanding verbalized.Will continue to monitor the patient throughout  the program.Maria Venetia Maxon, RN,BSN 02/21/2016 10:42 AM    Dr. Fransico Him is Medical Director for Cardiac Rehab at [facility].

## 2016-02-22 ENCOUNTER — Encounter (HOSPITAL_COMMUNITY)
Admission: RE | Admit: 2016-02-22 | Discharge: 2016-02-22 | Disposition: A | Discharge status: Home / Self Care | Payer: Medicare HMO | Source: Ambulatory Visit | Attending: Internal Medicine | Admitting: Internal Medicine

## 2016-02-22 DIAGNOSIS — I5042 Chronic combined systolic (congestive) and diastolic (congestive) heart failure: Secondary | ICD-10-CM

## 2016-02-22 DIAGNOSIS — I5022 Chronic systolic (congestive) heart failure: Secondary | ICD-10-CM | POA: Diagnosis not present

## 2016-02-23 ENCOUNTER — Telehealth (HOSPITAL_COMMUNITY): Payer: Self-pay

## 2016-02-23 NOTE — Telephone Encounter (Signed)
Patient's wife called CHF clinic asking for our office to fax over order to draw hgb level for his husband per the PCP request. Unsure why this is being requested for our office to order instead of PCP. No notes in chart indicating this, and return call to patient and wife were unsuccessful. Will attempt to return call to patient again later today.  Ave Filter, RN

## 2016-02-24 ENCOUNTER — Encounter (HOSPITAL_COMMUNITY)
Admission: RE | Admit: 2016-02-24 | Discharge: 2016-02-24 | Disposition: A | Discharge status: Home / Self Care | Payer: Medicare HMO | Source: Ambulatory Visit | Attending: Internal Medicine | Admitting: Internal Medicine

## 2016-02-24 DIAGNOSIS — I5042 Chronic combined systolic (congestive) and diastolic (congestive) heart failure: Secondary | ICD-10-CM

## 2016-02-24 DIAGNOSIS — I5022 Chronic systolic (congestive) heart failure: Secondary | ICD-10-CM | POA: Diagnosis not present

## 2016-02-27 ENCOUNTER — Encounter (HOSPITAL_COMMUNITY)
Admission: RE | Admit: 2016-02-27 | Discharge: 2016-02-27 | Disposition: A | Discharge status: Home / Self Care | Payer: Medicare HMO | Source: Ambulatory Visit | Attending: Internal Medicine | Admitting: Internal Medicine

## 2016-02-27 DIAGNOSIS — I5022 Chronic systolic (congestive) heart failure: Secondary | ICD-10-CM | POA: Diagnosis not present

## 2016-02-27 DIAGNOSIS — I5042 Chronic combined systolic (congestive) and diastolic (congestive) heart failure: Secondary | ICD-10-CM

## 2016-02-28 ENCOUNTER — Encounter (HOSPITAL_COMMUNITY): Payer: Self-pay

## 2016-02-28 ENCOUNTER — Telehealth: Payer: Self-pay | Admitting: Licensed Clinical Social Worker

## 2016-02-28 ENCOUNTER — Telehealth (HOSPITAL_COMMUNITY): Payer: Self-pay

## 2016-02-28 NOTE — Telephone Encounter (Signed)
[employer] left VM on CHF clinic disability line requesting to expedite claim request. Will start working on this to get paperwork completed this week. Request was sent 02/24/2016 via fax to our office, standard processing time in 10-14 business days.  Ave Filter, RN

## 2016-02-28 NOTE — Progress Notes (Signed)
[employer] faxed medical record request on 02/24/2016 for records from our office 09/03/2015--present. Claim # [id number] 58 pages totla faxed to provided # [phone] attn: Nicolette Melad Disbaility Information Specialist Copy of request scanned into patient's medical record.  Ave Filter, RN
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
## 2016-02-29 ENCOUNTER — Encounter (HOSPITAL_COMMUNITY)
Admission: RE | Admit: 2016-02-29 | Discharge: 2016-02-29 | Disposition: A | Discharge status: Home / Self Care | Payer: Medicare HMO | Source: Ambulatory Visit | Attending: Internal Medicine | Admitting: Internal Medicine

## 2016-02-29 DIAGNOSIS — I5022 Chronic systolic (congestive) heart failure: Secondary | ICD-10-CM | POA: Diagnosis not present

## 2016-02-29 DIAGNOSIS — I5042 Chronic combined systolic (congestive) and diastolic (congestive) heart failure: Secondary | ICD-10-CM

## 2016-03-02 ENCOUNTER — Encounter (HOSPITAL_COMMUNITY)
Admission: RE | Admit: 2016-03-02 | Discharge: 2016-03-02 | Disposition: A | Discharge status: Home / Self Care | Payer: Medicare HMO | Source: Ambulatory Visit | Attending: Internal Medicine | Admitting: Internal Medicine

## 2016-03-02 DIAGNOSIS — I5042 Chronic combined systolic (congestive) and diastolic (congestive) heart failure: Secondary | ICD-10-CM

## 2016-03-02 DIAGNOSIS — I5022 Chronic systolic (congestive) heart failure: Secondary | ICD-10-CM | POA: Diagnosis not present

## 2016-03-02 NOTE — Progress Notes (Signed)
QUALITY OF LIFE SCORE REVIEW  Pt completed Quality of Life survey as a participant in Cardiac Rehab. Scores 21.0 or below are considered low. Pt score very low in several areas Overall 18.55, Health and Function 10.29, socioeconomic 22.29, physiological and spiritual 28.29, family 22.80. Patient quality of life slightly altered by physical constraints which limits ability to perform as prior to recent cardiac illness.  Dale [name] is enjoying participating in phase 2 cardiac rehab so far. Dale [name] feels dissatisfied with his health due to his CHF diagnosis.  Offered emotional support and reassurance.  Will continue to monitor and intervene as necessary. Dale [name] hopes his shortness of breath will improve. Will fax quality of life questionnaire  To Dr Bensimhon's office for review.

## 2016-03-03 ENCOUNTER — Other Ambulatory Visit: Payer: Self-pay | Admitting: Student

## 2016-03-05 ENCOUNTER — Encounter (HOSPITAL_COMMUNITY)
Admission: RE | Admit: 2016-03-05 | Discharge: 2016-03-05 | Disposition: A | Discharge status: Home / Self Care | Payer: Medicare HMO | Source: Ambulatory Visit | Attending: Internal Medicine | Admitting: Internal Medicine

## 2016-03-05 DIAGNOSIS — I5042 Chronic combined systolic (congestive) and diastolic (congestive) heart failure: Secondary | ICD-10-CM

## 2016-03-05 DIAGNOSIS — I5022 Chronic systolic (congestive) heart failure: Secondary | ICD-10-CM | POA: Insufficient documentation

## 2016-03-05 NOTE — Progress Notes (Signed)
Reviewed home exercise guidelines with patient including endpoints, temperature precautions, target heart rate and rate of perceived exertion. Pt is walking 30 minutes 3 days per week as his mode of home exercise. Pt voices understanding of instructions given. Artist Pais, MS, ACSM CCEP

## 2016-03-07 ENCOUNTER — Encounter (HOSPITAL_COMMUNITY)
Admission: RE | Admit: 2016-03-07 | Discharge: 2016-03-07 | Disposition: A | Discharge status: Home / Self Care | Payer: Medicare HMO | Source: Ambulatory Visit | Attending: Internal Medicine | Admitting: Internal Medicine

## 2016-03-07 ENCOUNTER — Telehealth (HOSPITAL_COMMUNITY): Payer: Self-pay | Admitting: *Deleted

## 2016-03-07 DIAGNOSIS — I5042 Chronic combined systolic (congestive) and diastolic (congestive) heart failure: Secondary | ICD-10-CM

## 2016-03-07 DIAGNOSIS — I5022 Chronic systolic (congestive) heart failure: Secondary | ICD-10-CM | POA: Diagnosis not present

## 2016-03-07 NOTE — Progress Notes (Signed)
Dale [name] 70 y.o. male Nutrition Note Spoke with pt, pt's wife, and son. Nutrition Plan reviewed with pt. Nutrition Survey not reviewed due to pt's need to gain wt. Pt with dx of CHF. Per discussion, pt states Dr. Haroldine Laws said "I can eat anything I want." Pt ate a bacon biscuit for breakfast this morning and drinks Gatorade throughout the day. Pt reports he was told to increase his potassium intake so he started drinking Gatorade. Pt eats out frequently. Pt, pt's wife, and son educated re: High Calorie, High Protein, Low Sodium diet for Undernourished Heart Failure patients. The role of sodium in heart failure discussed. Pt expressed understanding of the information reviewed. Pt aware of nutrition education classes offered.  Lab Results  Component Value Date   HGBA1C 5.5 06/21/2015   Wt Readings from Last 3 Encounters:  02/16/16 144 lb 2.9 oz (65.4 kg)  01/23/16 144 lb 9.6 oz (65.6 kg)  12/15/15 141 lb (64 kg)    Nutrition Diagnosis ? Food-and nutrition-related knowledge deficit related to lack of exposure to information as related to diagnosis of: ? CHF ? Low body weight related to excessive energy expenditure and decreased p.o. intake as evidenced by pt wt being 7.4 lb below pt's UBW. Nutrition Intervention ? Pt's individual nutrition plan reviewed with pt. ? Pt to attend the Portion Distortion class - met ? Pt to attend the      ? Nutrition I class                     ? Nutrition II class  ? Pt given handouts for: ? Heart Failure Nutrition Therapy for the Undernourished          ? Nutrition II class ? Potassium Content of Foods  ? Continue client-centered nutrition education by RD, as part of interdisciplinary care. Goal(s) ? Pt to identify food quantities necessary to achieve weight gain to pt's UBW of ~150 lb at graduation from cardiac rehab.  ? Pt able to name and limit high sodium foods Monitor and Evaluate progress toward nutrition goal with team. Derek Mound, M.Ed,  RD, LDN, CDE 03/07/2016 4:00 PM

## 2016-03-07 NOTE — Telephone Encounter (Signed)
Records faxed to [facility] as requested.

## 2016-03-09 ENCOUNTER — Encounter (HOSPITAL_COMMUNITY): Admission: RE | Admit: 2016-03-09 | Payer: Medicare HMO | Source: Ambulatory Visit

## 2016-03-09 ENCOUNTER — Telehealth (HOSPITAL_COMMUNITY): Payer: Self-pay | Admitting: *Deleted

## 2016-03-12 ENCOUNTER — Telehealth (HOSPITAL_COMMUNITY): Payer: Self-pay | Admitting: Family Medicine

## 2016-03-12 ENCOUNTER — Encounter (HOSPITAL_COMMUNITY): Payer: Medicare HMO

## 2016-03-12 ENCOUNTER — Telehealth (HOSPITAL_COMMUNITY): Payer: Self-pay | Admitting: *Deleted

## 2016-03-12 NOTE — Telephone Encounter (Signed)
Pt's wife called concerned about pt, she states that he has a sore throat and has been much more fatigued lately.  She states he does not have a cough or fever, advised on safe OTC meds for sore throat, he is sch to see Dr Gala Romney and can address fatigue at that time.

## 2016-03-13 ENCOUNTER — Ambulatory Visit (HOSPITAL_BASED_OUTPATIENT_CLINIC_OR_DEPARTMENT_OTHER)
Admission: RE | Admit: 2016-03-13 | Discharge: 2016-03-13 | Disposition: A | Discharge status: Home / Self Care | Payer: Medicare HMO | Source: Ambulatory Visit | Attending: Internal Medicine | Admitting: Internal Medicine

## 2016-03-13 ENCOUNTER — Ambulatory Visit (HOSPITAL_COMMUNITY)
Admission: RE | Admit: 2016-03-13 | Discharge: 2016-03-13 | Disposition: A | Discharge status: Home / Self Care | Payer: Medicare HMO | Source: Ambulatory Visit | Attending: Family Medicine | Admitting: Family Medicine

## 2016-03-13 VITALS — BP 108/52 | HR 76 | Wt 145.2 lb

## 2016-03-13 DIAGNOSIS — I428 Other cardiomyopathies: Secondary | ICD-10-CM | POA: Diagnosis not present

## 2016-03-13 DIAGNOSIS — Z87891 Personal history of nicotine dependence: Secondary | ICD-10-CM | POA: Insufficient documentation

## 2016-03-13 DIAGNOSIS — I5042 Chronic combined systolic (congestive) and diastolic (congestive) heart failure: Secondary | ICD-10-CM | POA: Diagnosis not present

## 2016-03-13 DIAGNOSIS — I110 Hypertensive heart disease with heart failure: Secondary | ICD-10-CM | POA: Insufficient documentation

## 2016-03-13 DIAGNOSIS — I4891 Unspecified atrial fibrillation: Secondary | ICD-10-CM

## 2016-03-13 LAB — CBC
HEMATOCRIT: 33.3 % — AB (ref 39.0–52.0)
HEMOGLOBIN: 9.8 g/dL — AB (ref 13.0–17.0)
MCH: 22.6 pg — ABNORMAL LOW (ref 26.0–34.0)
MCHC: 29.4 g/dL — AB (ref 30.0–36.0)
MCV: 76.9 fL — ABNORMAL LOW (ref 78.0–100.0)
Platelets: 172 10*3/uL (ref 150–400)
RBC: 4.33 MIL/uL (ref 4.22–5.81)
RDW: 17.3 % — ABNORMAL HIGH (ref 11.5–15.5)
WBC: 6.4 10*3/uL (ref 4.0–10.5)

## 2016-03-13 LAB — COMPREHENSIVE METABOLIC PANEL
ALBUMIN: 3.8 g/dL (ref 3.5–5.0)
ALT: 16 U/L — ABNORMAL LOW (ref 17–63)
ANION GAP: 7 (ref 5–15)
AST: 25 U/L (ref 15–41)
Alkaline Phosphatase: 89 U/L (ref 38–126)
BILIRUBIN TOTAL: 0.8 mg/dL (ref 0.3–1.2)
BUN: 11 mg/dL (ref 6–20)
CALCIUM: 9.4 mg/dL (ref 8.9–10.3)
CO2: 22 mmol/L (ref 22–32)
Chloride: 109 mmol/L (ref 101–111)
Creatinine, Ser: 0.99 mg/dL (ref 0.61–1.24)
GFR calc non Af Amer: >60 mL/min (ref >60)
GLUCOSE: 80 mg/dL (ref 65–99)
POTASSIUM: 4.4 mmol/L (ref 3.5–5.1)
SODIUM: 138 mmol/L (ref 135–145)
TOTAL PROTEIN: 7.6 g/dL (ref 6.5–8.1)

## 2016-03-13 NOTE — Patient Instructions (Signed)
Follow up with Dr.Bensimhon in 3 weeks
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
## 2016-03-13 NOTE — Progress Notes (Signed)
  Echocardiogram 2D Echocardiogram has been performed.  Nolon Rod 03/13/2016, 11:45 AM

## 2016-03-14 ENCOUNTER — Telehealth (HOSPITAL_COMMUNITY): Payer: Self-pay

## 2016-03-14 ENCOUNTER — Encounter (HOSPITAL_COMMUNITY)
Admission: RE | Admit: 2016-03-14 | Discharge: 2016-03-14 | Disposition: A | Discharge status: Home / Self Care | Payer: Medicare HMO | Source: Ambulatory Visit | Attending: Internal Medicine | Admitting: Internal Medicine

## 2016-03-14 DIAGNOSIS — I5022 Chronic systolic (congestive) heart failure: Secondary | ICD-10-CM | POA: Diagnosis not present

## 2016-03-14 DIAGNOSIS — I5042 Chronic combined systolic (congestive) and diastolic (congestive) heart failure: Secondary | ICD-10-CM

## 2016-03-14 NOTE — Telephone Encounter (Signed)
With cardiac rehab called to report patient having NSR with asymptomatic ectopy during exercise today. Strips faxed to our office and reviewed with Otilio Saber, PA-C. No new orders or changes at this time, advised may continue to exercise as long as patient remains asymptomatic.  Ave Filter, RN

## 2016-03-14 NOTE — Progress Notes (Signed)
Increase PVC's and couplets noted today. Patient asymptomatic. Vital signs stable. Ritesh was out on Monday with a cold and sore throat. Caryl Comes RN called and notified at the heart failure clinic. Nelida Gores [facility] reviewed the ECG tracings from cardiac rehab. No new orders received.Will continue to monitor the patient throughout  the program. Jerolyn Shin exercised without complaints.Gladstone Lighter, RN,BSN 03/14/2016 5:04 PM

## 2016-03-15 NOTE — Progress Notes (Signed)
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 heart failure or chronic kidney disease.;Long Term: Maintenance of blood pressure at goal levels.   Lipids Yes   Intervention Provide education and support for participant on nutrition & aerobic/resistive exercise along with prescribed medications to achieve LDL 70mg , HDL >40mg .   Expected Outcomes Short Term: Participant states understanding of desired cholesterol values and is compliant with medications prescribed. Participant is following exercise prescription and nutrition guidelines.;Long Term: Cholesterol controlled with  medications as prescribed, with individualized exercise RX and with personalized nutrition plan. Value goals: LDL < 70mg , HDL > 40 mg.   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 Participant has gained 3.7 lbs. His goal is to gain 5-10 lbs.       Expected Outcomes Continue aerobic and resistance training program to help increase stength and stamina and help achieve goal of returning to work.          Core Components/Risk Factors/Patient Goals at Discharge (Final Review):      Goals and Risk Factor Review - 03/13/16 1215      Core Components/Risk Factors/Patient Goals Review   Personal Goals Review Sedentary;Weight Management/Obesity   Review Participant is walking 30 minutes, 3 days per week in addition to cardiac rehab. Participant has gained 3.7  lbs. His goal is to gain 5-10 lbs.   Expected Outcomes Continue aerobic and resistance training program to help increase stength and stamina and help achieve goal of returning to work.      ITP Comments:     ITP Comments    Row Name 02/16/16 0822           ITP Comments Dr. Armanda Magic, Medical Director          Comments: Aristide is making expected progress toward personal goals after completing 10 sessions. Recommend continued exercise and life style modification education including  stress management and  relaxation techniques to decrease cardiac risk profile. Alanmichael is enjoying participating in phase 2 cardiac rehab.Gladstone Lighter, RN,BSN 03/15/2016 2:52 PM

## 2016-03-16 ENCOUNTER — Encounter (HOSPITAL_COMMUNITY)
Admission: RE | Admit: 2016-03-16 | Discharge: 2016-03-16 | Disposition: A | Discharge status: Home / Self Care | Payer: Medicare HMO | Source: Ambulatory Visit | Attending: Internal Medicine | Admitting: Internal Medicine

## 2016-03-16 DIAGNOSIS — I5022 Chronic systolic (congestive) heart failure: Secondary | ICD-10-CM | POA: Diagnosis not present

## 2016-03-16 DIAGNOSIS — I5042 Chronic combined systolic (congestive) and diastolic (congestive) heart failure: Secondary | ICD-10-CM

## 2016-03-19 ENCOUNTER — Encounter (HOSPITAL_COMMUNITY)
Admission: RE | Admit: 2016-03-19 | Discharge: 2016-03-19 | Disposition: A | Discharge status: Home / Self Care | Payer: Medicare HMO | Source: Ambulatory Visit | Attending: Internal Medicine | Admitting: Internal Medicine

## 2016-03-19 DIAGNOSIS — I5042 Chronic combined systolic (congestive) and diastolic (congestive) heart failure: Secondary | ICD-10-CM

## 2016-03-19 DIAGNOSIS — I5022 Chronic systolic (congestive) heart failure: Secondary | ICD-10-CM | POA: Diagnosis not present

## 2016-03-20 ENCOUNTER — Telehealth (HOSPITAL_COMMUNITY): Payer: Self-pay | Admitting: Vascular Surgery

## 2016-03-20 DIAGNOSIS — I5022 Chronic systolic (congestive) heart failure: Secondary | ICD-10-CM

## 2016-03-20 NOTE — Telephone Encounter (Signed)
PT WIFE CALLED SHE WANTED TO SCHEDULE A STRESS TEST FOR HER HUSBAND , SHE WAS TOLD BY DR. Gala Romney TO CALL TO SCHEDULE, PT HAS NO ORDERS IN FOR A STRESS TEST PLEASE ADVISE

## 2016-03-21 ENCOUNTER — Encounter (HOSPITAL_COMMUNITY)
Admission: RE | Admit: 2016-03-21 | Discharge: 2016-03-21 | Disposition: A | Discharge status: Home / Self Care | Payer: Medicare HMO | Source: Ambulatory Visit | Attending: Internal Medicine | Admitting: Internal Medicine

## 2016-03-21 DIAGNOSIS — I5042 Chronic combined systolic (congestive) and diastolic (congestive) heart failure: Secondary | ICD-10-CM

## 2016-03-21 DIAGNOSIS — I5022 Chronic systolic (congestive) heart failure: Secondary | ICD-10-CM | POA: Diagnosis not present

## 2016-03-21 NOTE — Telephone Encounter (Signed)
Per Dr Gala Romney ok to have CPX in 2 weeks so he can have a few more sessions with cardiac rehab, order placed, Kamilah please schedule

## 2016-03-23 ENCOUNTER — Encounter (HOSPITAL_COMMUNITY)
Admission: RE | Admit: 2016-03-23 | Discharge: 2016-03-23 | Disposition: A | Discharge status: Home / Self Care | Payer: Medicare HMO | Source: Ambulatory Visit | Attending: Internal Medicine | Admitting: Internal Medicine

## 2016-03-23 DIAGNOSIS — I5042 Chronic combined systolic (congestive) and diastolic (congestive) heart failure: Secondary | ICD-10-CM

## 2016-03-23 DIAGNOSIS — I5022 Chronic systolic (congestive) heart failure: Secondary | ICD-10-CM | POA: Diagnosis not present

## 2016-03-26 ENCOUNTER — Encounter (HOSPITAL_COMMUNITY)
Admission: RE | Admit: 2016-03-26 | Discharge: 2016-03-26 | Disposition: A | Discharge status: Home / Self Care | Payer: Medicare HMO | Source: Ambulatory Visit | Attending: Internal Medicine | Admitting: Internal Medicine

## 2016-03-26 DIAGNOSIS — I5042 Chronic combined systolic (congestive) and diastolic (congestive) heart failure: Secondary | ICD-10-CM

## 2016-03-26 DIAGNOSIS — I5022 Chronic systolic (congestive) heart failure: Secondary | ICD-10-CM | POA: Diagnosis not present

## 2016-03-28 ENCOUNTER — Encounter (HOSPITAL_COMMUNITY)
Admission: RE | Admit: 2016-03-28 | Discharge: 2016-03-28 | Disposition: A | Discharge status: Home / Self Care | Payer: Medicare HMO | Source: Ambulatory Visit | Attending: Internal Medicine | Admitting: Internal Medicine

## 2016-03-28 DIAGNOSIS — I5042 Chronic combined systolic (congestive) and diastolic (congestive) heart failure: Secondary | ICD-10-CM

## 2016-03-28 DIAGNOSIS — I5022 Chronic systolic (congestive) heart failure: Secondary | ICD-10-CM | POA: Diagnosis not present

## 2016-03-30 ENCOUNTER — Encounter (HOSPITAL_COMMUNITY): Payer: Medicare HMO

## 2016-04-02 ENCOUNTER — Encounter (HOSPITAL_COMMUNITY)
Admission: RE | Admit: 2016-04-02 | Discharge: 2016-04-02 | Disposition: A | Discharge status: Home / Self Care | Payer: Medicare HMO | Source: Ambulatory Visit | Attending: Internal Medicine | Admitting: Internal Medicine

## 2016-04-02 DIAGNOSIS — I5022 Chronic systolic (congestive) heart failure: Secondary | ICD-10-CM | POA: Diagnosis not present

## 2016-04-02 DIAGNOSIS — I5042 Chronic combined systolic (congestive) and diastolic (congestive) heart failure: Secondary | ICD-10-CM

## 2016-04-04 ENCOUNTER — Encounter (HOSPITAL_COMMUNITY)
Admission: RE | Admit: 2016-04-04 | Discharge: 2016-04-04 | Disposition: A | Discharge status: Home / Self Care | Payer: Medicare HMO | Source: Ambulatory Visit | Attending: Internal Medicine | Admitting: Internal Medicine

## 2016-04-04 DIAGNOSIS — I5022 Chronic systolic (congestive) heart failure: Secondary | ICD-10-CM | POA: Insufficient documentation

## 2016-04-04 DIAGNOSIS — I5042 Chronic combined systolic (congestive) and diastolic (congestive) heart failure: Secondary | ICD-10-CM

## 2016-04-05 ENCOUNTER — Encounter (HOSPITAL_COMMUNITY): Payer: Self-pay | Admitting: Internal Medicine

## 2016-04-05 ENCOUNTER — Ambulatory Visit (HOSPITAL_COMMUNITY)
Admission: RE | Admit: 2016-04-05 | Discharge: 2016-04-05 | Disposition: A | Discharge status: Home / Self Care | Payer: Medicare HMO | Source: Ambulatory Visit | Attending: Internal Medicine | Admitting: Internal Medicine

## 2016-04-05 ENCOUNTER — Encounter (HOSPITAL_COMMUNITY): Payer: Self-pay | Admitting: *Deleted

## 2016-04-05 VITALS — BP 122/66 | HR 78 | Wt 149.8 lb

## 2016-04-05 DIAGNOSIS — I429 Cardiomyopathy, unspecified: Secondary | ICD-10-CM | POA: Diagnosis present

## 2016-04-05 DIAGNOSIS — Z9889 Other specified postprocedural states: Secondary | ICD-10-CM | POA: Insufficient documentation

## 2016-04-05 DIAGNOSIS — Z7901 Long term (current) use of anticoagulants: Secondary | ICD-10-CM | POA: Insufficient documentation

## 2016-04-05 DIAGNOSIS — N62 Hypertrophy of breast: Secondary | ICD-10-CM | POA: Insufficient documentation

## 2016-04-05 DIAGNOSIS — I5042 Chronic combined systolic (congestive) and diastolic (congestive) heart failure: Secondary | ICD-10-CM | POA: Insufficient documentation

## 2016-04-05 DIAGNOSIS — I4891 Unspecified atrial fibrillation: Secondary | ICD-10-CM | POA: Insufficient documentation

## 2016-04-05 DIAGNOSIS — Z87891 Personal history of nicotine dependence: Secondary | ICD-10-CM | POA: Diagnosis not present

## 2016-04-05 DIAGNOSIS — I110 Hypertensive heart disease with heart failure: Secondary | ICD-10-CM | POA: Diagnosis not present

## 2016-04-05 DIAGNOSIS — I5022 Chronic systolic (congestive) heart failure: Secondary | ICD-10-CM

## 2016-04-05 DIAGNOSIS — Z79899 Other long term (current) drug therapy: Secondary | ICD-10-CM | POA: Diagnosis not present

## 2016-04-05 DIAGNOSIS — R0602 Shortness of breath: Secondary | ICD-10-CM | POA: Insufficient documentation

## 2016-04-05 DIAGNOSIS — Z888 Allergy status to other drugs, medicaments and biological substances status: Secondary | ICD-10-CM | POA: Insufficient documentation

## 2016-04-05 DIAGNOSIS — B1920 Unspecified viral hepatitis C without hepatic coma: Secondary | ICD-10-CM | POA: Diagnosis not present

## 2016-04-05 DIAGNOSIS — Z87442 Personal history of urinary calculi: Secondary | ICD-10-CM | POA: Diagnosis not present

## 2016-04-05 DIAGNOSIS — I480 Paroxysmal atrial fibrillation: Secondary | ICD-10-CM

## 2016-04-05 DIAGNOSIS — M1990 Unspecified osteoarthritis, unspecified site: Secondary | ICD-10-CM | POA: Diagnosis not present

## 2016-04-05 MED ORDER — FUROSEMIDE 20 MG PO TABS: 20.0000 mg | ORAL_TABLET | ORAL | 3 refills | Status: AC | PRN

## 2016-04-05 MED ORDER — EPLERENONE 25 MG PO TABS: 25.0000 mg | ORAL_TABLET | Freq: Every day | ORAL | 6 refills | Status: DC

## 2016-04-05 MED ORDER — LOSARTAN POTASSIUM 25 MG PO TABS: 25.0000 mg | ORAL_TABLET | Freq: Every day | ORAL | 5 refills | Status: DC
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
## 2016-04-05 NOTE — Patient Instructions (Signed)
Stop Spironolactone  Start Epleronone 25 mg daily  Increase Losartan to 25 mg (1 tab) daily  Take Furosemide (Lasix) 20 mg as needed  Your physician recommends that you schedule a follow-up appointment in: 6-8 weeks

## 2016-04-05 NOTE — Addendum Note (Signed)
Encounter addended by: Noralee Space, RN on: 04/05/2016 10:31 AM<BR>    Actions taken: Medication long-term status modified, Order Entry activity accessed, Sign clinical note

## 2016-04-05 NOTE — Addendum Note (Signed)
Encounter addended by: Modesta Messing, CMA on: 04/05/2016 11:08 AM<BR>    Actions taken: Visit Navigator Flowsheet section accepted

## 2016-04-06 ENCOUNTER — Encounter (HOSPITAL_COMMUNITY)
Admission: RE | Admit: 2016-04-06 | Discharge: 2016-04-06 | Disposition: A | Discharge status: Home / Self Care | Payer: Medicare HMO | Source: Ambulatory Visit | Attending: Internal Medicine | Admitting: Internal Medicine

## 2016-04-06 DIAGNOSIS — I5022 Chronic systolic (congestive) heart failure: Secondary | ICD-10-CM | POA: Diagnosis not present

## 2016-04-06 DIAGNOSIS — I5042 Chronic combined systolic (congestive) and diastolic (congestive) heart failure: Secondary | ICD-10-CM

## 2016-04-09 ENCOUNTER — Encounter (HOSPITAL_COMMUNITY)
Admission: RE | Admit: 2016-04-09 | Discharge: 2016-04-09 | Disposition: A | Discharge status: Home / Self Care | Payer: Medicare HMO | Source: Ambulatory Visit | Attending: Internal Medicine | Admitting: Internal Medicine

## 2016-04-09 DIAGNOSIS — I5022 Chronic systolic (congestive) heart failure: Secondary | ICD-10-CM | POA: Diagnosis not present

## 2016-04-09 DIAGNOSIS — I5042 Chronic combined systolic (congestive) and diastolic (congestive) heart failure: Secondary | ICD-10-CM

## 2016-04-10 ENCOUNTER — Encounter (HOSPITAL_COMMUNITY): Payer: Self-pay

## 2016-04-10 ENCOUNTER — Ambulatory Visit (HOSPITAL_COMMUNITY): Payer: Medicare HMO | Attending: Cardiology

## 2016-04-10 DIAGNOSIS — R9439 Abnormal result of other cardiovascular function study: Secondary | ICD-10-CM | POA: Insufficient documentation

## 2016-04-10 DIAGNOSIS — I5022 Chronic systolic (congestive) heart failure: Secondary | ICD-10-CM | POA: Diagnosis present

## 2016-04-11 ENCOUNTER — Encounter (HOSPITAL_COMMUNITY)
Admission: RE | Admit: 2016-04-11 | Discharge: 2016-04-11 | Disposition: A | Discharge status: Home / Self Care | Payer: Medicare HMO | Source: Ambulatory Visit | Attending: Internal Medicine | Admitting: Internal Medicine

## 2016-04-11 DIAGNOSIS — I5022 Chronic systolic (congestive) heart failure: Secondary | ICD-10-CM | POA: Diagnosis not present

## 2016-04-11 DIAGNOSIS — I5042 Chronic combined systolic (congestive) and diastolic (congestive) heart failure: Secondary | ICD-10-CM

## 2016-04-12 NOTE — Progress Notes (Signed)
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 supplements   Sedentary Yes   Intervention Provide advice, education, support and counseling about physical activity/exercise needs.;Develop an individualized exercise prescription for aerobic and resistive training based on initial evaluation findings, risk stratification, comorbidities and participant's personal goals.   Expected Outcomes Achievement of increased cardiorespiratory fitness and enhanced flexibility, muscular endurance and strength shown through measurements of functional capacity and personal statement of participant.    Increase Strength and Stamina Yes   Intervention Provide advice, education, support and counseling about physical activity/exercise needs.;Develop an individualized exercise prescription for aerobic and resistive training based on initial evaluation findings, risk stratification, comorbidities and participant's personal goals.   Expected Outcomes Achievement of increased cardiorespiratory fitness and enhanced flexibility, muscular endurance and strength shown through measurements of functional capacity and personal statement of participant.   Heart Failure Yes   Intervention Provide a combined exercise and nutrition program that is supplemented with education, support and counseling about heart failure. Directed toward relieving symptoms such as shortness of breath, decreased exercise tolerance, and extremity edema.   Expected Outcomes Improve functional capacity of life;Short term: Attendance in program 2-3 days a week with increased exercise capacity. Reported lower sodium intake. Reported increased fruit and vegetable intake. Reports medication compliance.;Short term: Daily weights obtained and reported for increase. Utilizing diuretic protocols set by physician.;Long term: Adoption of self-care skills and reduction of barriers for early signs and symptoms recognition and intervention leading to self-care maintenance.   Hypertension Yes   Intervention Provide education on lifestyle modifcations including regular physical activity/exercise, weight management, moderate sodium restriction and increased consumption of fresh fruit, vegetables, and low fat dairy, alcohol moderation, and smoking cessation.;Monitor prescription use compliance.   Expected Outcomes Short Term: Continued assessment and intervention until BP  is < 140/31mm HG in hypertensive participants. < 130/41mm HG in hypertensive participants with diabetes, heart failure or chronic kidney disease.;Long Term: Maintenance of blood pressure at  goal levels.   Lipids Yes   Intervention Provide education and support for participant on nutrition & aerobic/resistive exercise along with prescribed medications to achieve LDL 70mg , HDL >40mg .   Expected Outcomes Short Term: Participant states understanding of desired cholesterol values and is compliant with medications prescribed. Participant is following exercise prescription and nutrition guidelines.;Long Term: Cholesterol controlled with medications as prescribed, with individualized exercise RX and with personalized nutrition plan. Value goals: LDL < 70mg , HDL > 40 mg.   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   Intervention Provide an exercise program to improve aerobic capacity and nutrition counseling to assist with weight gain   Expected Outcomes Pt will walk more with less fatigue and gain weight      Core Components/Risk Factors/Patient Goals Review:      Goals and Risk Factor Review    Row Name 03/13/16 1215 04/11/16 1734           Core Components/Risk Factors/Patient Goals Review   Personal Goals Review Sedentary;Weight Management/Obesity Sedentary;Weight Management/Obesity;Increase Strength and Stamina;Other      Review Participant is walking 30 minutes, 3 days per week in addition to cardiac rehab. Participant has gained 3.7 lbs. His goal is to gain 5-10 lbs. Dale [name] will be returning to work this Saturday, which was one of his personal goals for the program. He is exercising 3 days at home and 3 days at cardiac rehab. He states that he is able to lift 10lbs wihtout problem. Participant has gained 5.9lbs, which was one of his goals.      Expected Outcomes Continue aerobic and resistance training program to help increase stength and stamina and help achieve goal of returning to work. Participant is returning to work and strength and stamina have improved per pt statement. He  also wanted to gain 5-10lb and has currently gained 5.9lbs.         Core Components/Risk Factors/Patient Goals at Discharge (Final Review):      Goals and Risk Factor Review - 04/11/16 1734      Core Components/Risk Factors/Patient Goals Review   Personal Goals Review Sedentary;Weight Management/Obesity;Increase Strength and Stamina;Other   Review Dale [name] will be returning to work this Saturday, which was one of his personal goals for the program. He is exercising 3 days at home and 3 days at cardiac rehab. He states that he is able to lift 10lbs wihtout problem. Participant has gained 5.9lbs, which was one of his goals.   Expected Outcomes Participant is returning to work and strength and stamina have improved per pt statement. He also wanted to gain 5-10lb and has currently gained 5.9lbs.      ITP Comments:     ITP Comments    Row Name 02/16/16 0822           ITP Comments Dr. Armanda Magic, Medical Director          Comments: Dale [name] is making expected progress toward personal goals after completing 20 sessions. Recommend continued exercise and life style modification education including  stress management and relaxation techniques to decrease cardiac risk profile. Dale [name] says he is feeling stronger and plans to return to [employer] to work soon.Gladstone Lighter, RN,BSN 04/12/2016 11:38 AM

## 2016-04-13 ENCOUNTER — Encounter (HOSPITAL_COMMUNITY)
Admission: RE | Admit: 2016-04-13 | Discharge: 2016-04-13 | Disposition: A | Discharge status: Home / Self Care | Payer: Medicare HMO | Source: Ambulatory Visit | Attending: Internal Medicine | Admitting: Internal Medicine

## 2016-04-13 DIAGNOSIS — I5022 Chronic systolic (congestive) heart failure: Secondary | ICD-10-CM | POA: Diagnosis not present

## 2016-04-13 DIAGNOSIS — I5042 Chronic combined systolic (congestive) and diastolic (congestive) heart failure: Secondary | ICD-10-CM

## 2016-04-16 ENCOUNTER — Encounter (HOSPITAL_COMMUNITY)
Admission: RE | Admit: 2016-04-16 | Discharge: 2016-04-16 | Disposition: A | Discharge status: Home / Self Care | Payer: Medicare HMO | Source: Ambulatory Visit | Attending: Internal Medicine | Admitting: Internal Medicine

## 2016-04-16 DIAGNOSIS — I5022 Chronic systolic (congestive) heart failure: Secondary | ICD-10-CM | POA: Diagnosis not present

## 2016-04-16 DIAGNOSIS — I5042 Chronic combined systolic (congestive) and diastolic (congestive) heart failure: Secondary | ICD-10-CM

## 2016-04-18 ENCOUNTER — Encounter (HOSPITAL_COMMUNITY)
Admission: RE | Admit: 2016-04-18 | Discharge: 2016-04-18 | Disposition: A | Discharge status: Home / Self Care | Payer: Medicare HMO | Source: Ambulatory Visit | Attending: Internal Medicine | Admitting: Internal Medicine

## 2016-04-18 DIAGNOSIS — I5022 Chronic systolic (congestive) heart failure: Secondary | ICD-10-CM | POA: Diagnosis not present

## 2016-04-19 ENCOUNTER — Ambulatory Visit: Payer: Medicare HMO | Admitting: Internal Medicine

## 2016-04-19 DIAGNOSIS — Z736 Limitation of activities due to disability: Secondary | ICD-10-CM

## 2016-04-20 ENCOUNTER — Encounter (HOSPITAL_COMMUNITY)
Admission: RE | Admit: 2016-04-20 | Discharge: 2016-04-20 | Disposition: A | Discharge status: Home / Self Care | Payer: Medicare HMO | Source: Ambulatory Visit | Attending: Internal Medicine | Admitting: Internal Medicine

## 2016-04-20 DIAGNOSIS — I5022 Chronic systolic (congestive) heart failure: Secondary | ICD-10-CM | POA: Diagnosis not present

## 2016-04-20 DIAGNOSIS — I5042 Chronic combined systolic (congestive) and diastolic (congestive) heart failure: Secondary | ICD-10-CM

## 2016-04-23 ENCOUNTER — Encounter (HOSPITAL_COMMUNITY): Payer: Medicare HMO

## 2016-04-23 ENCOUNTER — Telehealth (HOSPITAL_COMMUNITY): Payer: Self-pay | Admitting: Family Medicine

## 2016-04-25 ENCOUNTER — Encounter (HOSPITAL_COMMUNITY)
Admission: RE | Admit: 2016-04-25 | Discharge: 2016-04-25 | Disposition: A | Discharge status: Home / Self Care | Payer: Medicare HMO | Source: Ambulatory Visit | Attending: Internal Medicine | Admitting: Internal Medicine

## 2016-04-25 DIAGNOSIS — I5022 Chronic systolic (congestive) heart failure: Secondary | ICD-10-CM | POA: Diagnosis not present

## 2016-04-25 DIAGNOSIS — I5042 Chronic combined systolic (congestive) and diastolic (congestive) heart failure: Secondary | ICD-10-CM

## 2016-04-29 ENCOUNTER — Emergency Department (HOSPITAL_COMMUNITY): Payer: Medicare HMO

## 2016-04-29 ENCOUNTER — Encounter (HOSPITAL_COMMUNITY): Payer: Self-pay

## 2016-04-29 ENCOUNTER — Emergency Department (HOSPITAL_COMMUNITY)
Admission: EM | Admit: 2016-04-29 | Discharge: 2016-04-29 | Disposition: A | Discharge status: Home / Self Care | Payer: Medicare HMO | Attending: Emergency Medicine | Admitting: Emergency Medicine

## 2016-04-29 DIAGNOSIS — I5042 Chronic combined systolic (congestive) and diastolic (congestive) heart failure: Secondary | ICD-10-CM | POA: Insufficient documentation

## 2016-04-29 DIAGNOSIS — I110 Hypertensive heart disease with heart failure: Secondary | ICD-10-CM | POA: Insufficient documentation

## 2016-04-29 DIAGNOSIS — R079 Chest pain, unspecified: Secondary | ICD-10-CM | POA: Diagnosis not present

## 2016-04-29 DIAGNOSIS — Z87891 Personal history of nicotine dependence: Secondary | ICD-10-CM | POA: Insufficient documentation

## 2016-04-29 DIAGNOSIS — Z79899 Other long term (current) drug therapy: Secondary | ICD-10-CM | POA: Diagnosis not present

## 2016-04-29 LAB — BASIC METABOLIC PANEL
Anion gap: 7 (ref 5–15)
BUN: 15 mg/dL (ref 6–20)
CHLORIDE: 107 mmol/L (ref 101–111)
CO2: 25 mmol/L (ref 22–32)
Calcium: 9.7 mg/dL (ref 8.9–10.3)
Creatinine, Ser: 1.01 mg/dL (ref 0.61–1.24)
GFR calc non Af Amer: >60 mL/min (ref >60)
Glucose, Bld: 101 mg/dL — ABNORMAL HIGH (ref 65–99)
POTASSIUM: 4 mmol/L (ref 3.5–5.1)
SODIUM: 139 mmol/L (ref 135–145)

## 2016-04-29 LAB — I-STAT TROPONIN, ED: Troponin i, poc: 0 ng/mL (ref 0.00–0.08)

## 2016-04-29 LAB — CBC
HEMATOCRIT: 30.6 % — AB (ref 39.0–52.0)
Hemoglobin: 9.1 g/dL — ABNORMAL LOW (ref 13.0–17.0)
MCH: 22.4 pg — ABNORMAL LOW (ref 26.0–34.0)
MCHC: 29.7 g/dL — ABNORMAL LOW (ref 30.0–36.0)
MCV: 75.2 fL — AB (ref 78.0–100.0)
Platelets: 168 10*3/uL (ref 150–400)
RBC: 4.07 MIL/uL — AB (ref 4.22–5.81)
RDW: 17.3 % — ABNORMAL HIGH (ref 11.5–15.5)
WBC: 5.6 10*3/uL (ref 4.0–10.5)
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
## 2016-04-29 NOTE — Discharge Instructions (Signed)
Your testing was normal There was no signs of heart attack If you should develop severe or worsening pain, return to the ER.  Please obtain all of your results from medical records or have your doctors office obtain the results - share them with your doctor - you should be seen at your doctors office in the next 2 days. Call today to arrange your follow up. Take the medications as prescribed. Please review all of the medicines and only take them if you do not have an allergy to them. Please be aware that if you are taking birth control pills, taking other prescriptions, ESPECIALLY ANTIBIOTICS may make the birth control ineffective - if this is the case, either do not engage in sexual activity or use alternative methods of birth control such as condoms until you have finished the medicine and your family doctor says it is OK to restart them. If you are on a blood thinner such as COUMADIN, be aware that any other medicine that you take may cause the coumadin to either work too much, or not enough - you should have your coumadin level rechecked in next 7 days if this is the case.  ?  It is also a possibility that you have an allergic reaction to any of the medicines that you have been prescribed - Everybody reacts differently to medications and while MOST people have no trouble with most medicines, you may have a reaction such as nausea, vomiting, rash, swelling, shortness of breath. If this is the case, please stop taking the medicine immediately and contact your physician.  ?  You should return to the ER if you develop severe or worsening symptoms.

## 2016-04-29 NOTE — ED Triage Notes (Signed)
Patient complains of 2 episodes of SSCP that he describes as indigestion feeling that started at 130pm. Got relief after 4 tums. States that the discomfort returned at 430pm and then resolved again pta. No associated symptoms.

## 2016-04-30 ENCOUNTER — Telehealth (HOSPITAL_COMMUNITY): Payer: Self-pay | Admitting: *Deleted

## 2016-04-30 ENCOUNTER — Other Ambulatory Visit (HOSPITAL_COMMUNITY): Payer: Self-pay

## 2016-04-30 ENCOUNTER — Encounter (HOSPITAL_COMMUNITY): Admission: RE | Admit: 2016-04-30 | Payer: Medicare HMO | Source: Ambulatory Visit

## 2016-04-30 ENCOUNTER — Telehealth (HOSPITAL_COMMUNITY): Payer: Self-pay

## 2016-04-30 MED ORDER — OMEPRAZOLE 20 MG PO CPDR: 20.0000 mg | DELAYED_RELEASE_CAPSULE | Freq: Every day | ORAL | 11 refills | Status: DC
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
## 2016-04-30 NOTE — Telephone Encounter (Signed)
Spouse calling CHF clinic to report recent ED visit that resulted in Dx of acid reflux. Advised may try OTC Prilosec to see if this helps. Aware and agreeable.  Ave Filter, RN

## 2016-05-02 ENCOUNTER — Encounter (HOSPITAL_COMMUNITY)
Admission: RE | Admit: 2016-05-02 | Discharge: 2016-05-02 | Disposition: A | Discharge status: Home / Self Care | Payer: Medicare HMO | Source: Ambulatory Visit | Attending: Internal Medicine | Admitting: Internal Medicine

## 2016-05-02 ENCOUNTER — Telehealth (HOSPITAL_COMMUNITY): Payer: Self-pay | Admitting: *Deleted

## 2016-05-02 DIAGNOSIS — I5022 Chronic systolic (congestive) heart failure: Secondary | ICD-10-CM | POA: Diagnosis not present

## 2016-05-02 DIAGNOSIS — I5042 Chronic combined systolic (congestive) and diastolic (congestive) heart failure: Secondary | ICD-10-CM

## 2016-05-02 NOTE — Telephone Encounter (Signed)
Dale [name] called to make sure pt is ok to resume cardiac rehab after recent ER visit for cp.  Per Dr Gala Romney pt ok to resume cardiac rehab, Dale [name] aware

## 2016-05-04 ENCOUNTER — Encounter (HOSPITAL_COMMUNITY)
Admission: RE | Admit: 2016-05-04 | Discharge: 2016-05-04 | Disposition: A | Discharge status: Home / Self Care | Payer: Medicare HMO | Source: Ambulatory Visit | Attending: Internal Medicine | Admitting: Internal Medicine

## 2016-05-04 DIAGNOSIS — I5022 Chronic systolic (congestive) heart failure: Secondary | ICD-10-CM | POA: Insufficient documentation

## 2016-05-04 DIAGNOSIS — I5042 Chronic combined systolic (congestive) and diastolic (congestive) heart failure: Secondary | ICD-10-CM

## 2016-05-07 ENCOUNTER — Encounter (HOSPITAL_COMMUNITY)
Admission: RE | Admit: 2016-05-07 | Discharge: 2016-05-07 | Disposition: A | Discharge status: Home / Self Care | Payer: Medicare HMO | Source: Ambulatory Visit | Attending: Internal Medicine | Admitting: Internal Medicine

## 2016-05-07 DIAGNOSIS — I5022 Chronic systolic (congestive) heart failure: Secondary | ICD-10-CM | POA: Diagnosis not present

## 2016-05-07 DIAGNOSIS — I5042 Chronic combined systolic (congestive) and diastolic (congestive) heart failure: Secondary | ICD-10-CM

## 2016-05-08 NOTE — Progress Notes (Signed)
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 more servings of fruits and vegetables daily;Understanding recommendations for meals to include 15-35% energy as protein, 25-35% energy from fat, 35-60% energy from carbohydrates, less than 200mg  of dietary cholesterol, 20-35 gm of total fiber daily;Understanding of distribution of calorie intake throughout the day with the consumption of 4-5 meals/snacks;Weight Gain:  Understanding of general recommendations for a high calorie, high protein meal plan that promotes weight gain by distributing calorie intake throughout the day with the consumption for 4-5 meals, snacks, and/or supplements   Sedentary Yes   Intervention Provide advice, education, support and counseling about physical activity/exercise needs.;Develop an individualized exercise prescription for aerobic and resistive training based on initial evaluation findings, risk stratification, comorbidities and participant's personal goals.   Expected Outcomes Achievement of increased cardiorespiratory fitness and enhanced flexibility, muscular endurance and strength shown through measurements of functional capacity and personal statement of participant.   Increase Strength and Stamina Yes   Intervention Provide advice, education, support and counseling about physical activity/exercise needs.;Develop an individualized exercise prescription for aerobic and resistive training based on initial evaluation findings, risk stratification, comorbidities and participant's personal goals.   Expected Outcomes Achievement of increased cardiorespiratory fitness and enhanced flexibility, muscular endurance and strength shown through measurements of functional capacity and personal statement of participant.   Heart Failure Yes   Intervention Provide a combined exercise and nutrition program that is supplemented with education, support and counseling about heart failure. Directed toward relieving symptoms such as shortness of breath, decreased exercise tolerance, and extremity edema.   Expected Outcomes Improve functional capacity of life;Short term: Attendance in program 2-3 days a week with increased exercise capacity. Reported lower sodium intake. Reported increased fruit and vegetable intake. Reports medication compliance.;Short term: Daily weights obtained  and reported for increase. Utilizing diuretic protocols set by  physician.;Long term: Adoption of self-care skills and reduction of barriers for early signs and symptoms recognition and intervention leading to self-care maintenance.   Hypertension Yes   Intervention Provide education on lifestyle modifcations including regular physical activity/exercise, weight management, moderate sodium restriction and increased consumption of fresh fruit, vegetables, and low fat dairy, alcohol moderation, and smoking cessation.;Monitor prescription use compliance.   Expected Outcomes Short Term: Continued assessment and intervention until BP is < 140/56mm HG in hypertensive participants. < 130/54mm HG in hypertensive participants with diabetes, heart failure or chronic kidney disease.;Long Term: Maintenance of blood pressure at goal levels.   Lipids Yes   Intervention Provide education and support for participant on nutrition & aerobic/resistive exercise along with prescribed medications to achieve LDL 70mg , HDL >40mg .   Expected Outcomes Short Term: Participant states understanding of desired cholesterol values and is compliant with medications prescribed. Participant is following exercise prescription and nutrition guidelines.;Long Term: Cholesterol controlled with medications as prescribed, with individualized exercise RX and with personalized nutrition plan. Value goals: LDL < 70mg , HDL > 40 mg.   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   Intervention Provide an exercise program to improve aerobic capacity and nutrition counseling to assist with weight gain   Expected Outcomes Pt will walk more with less fatigue and gain weight      Core Components/Risk Factors/Patient Goals Review:      Goals and Risk Factor Review    Row Name 03/13/16 1215 04/11/16 1734 05/09/16 1700         Core Components/Risk Factors/Patient Goals Review   Personal Goals Review  Sedentary;Weight Management/Obesity Sedentary;Weight Management/Obesity;Increase Strength and Stamina;Other Sedentary;Weight Management/Obesity;Increase Strength and Stamina;Other     Review Participant is walking 30 minutes, 3 days per week in addition to cardiac rehab. Participant has gained 3.7 lbs. His goal is to gain 5-10 lbs. Swan will be returning to work this Saturday, which was one of his personal goals for the program. He is exercising 3 days at home and 3 days at cardiac rehab. He states that he is able to lift 10lbs wihtout problem. Participant has gained 5.9lbs, which was one of his goals. Pt has returned to work and states he feels great.  He is exercising 3 days/week outside of [facility] and he states he feels great and he has more stamina.       Expected Outcomes Continue aerobic and resistance training program to help increase stength and stamina and help achieve goal of returning to work. Participant is returning to work and strength and stamina have improved per pt statement. He also wanted to gain 5-10lb and has currently gained 5.9lbs. Continue exercise at [facility] and [facility] in order to gain and mainting strength, stamina and overall cardiovascular fitness.          Core Components/Risk Factors/Patient Goals at Discharge (Final Review):      Goals and Risk Factor Review - 05/09/16 1700      Core Components/Risk Factors/Patient Goals Review   Personal Goals Review Sedentary;Weight Management/Obesity;Increase Strength and Stamina;Other   Review Pt has returned to work and states he feels great.  He is exercising 3 days/week outside of [facility] and he states he feels great and he has more stamina.     Expected Outcomes Continue exercise at [facility] and [facility] in order to  gain and mainting strength, stamina and overall cardiovascular fitness.        ITP Comments:     ITP Comments    Row Name 02/16/16 1610 05/04/16 1438         ITP Comments Dr. Armanda Magic, Medical Director attended  Hypertension education class          Comments: Dale [name] is making expected progress toward personal goals after completing 29 sessions. Recommend continued exercise and life style modification education including  stress management and relaxation techniques to decrease cardiac risk profile. 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 05/10/2016 12:29 PM

## 2016-05-09 ENCOUNTER — Encounter (HOSPITAL_COMMUNITY)
Admission: RE | Admit: 2016-05-09 | Discharge: 2016-05-09 | Disposition: A | Discharge status: Home / Self Care | Payer: Medicare HMO | Source: Ambulatory Visit | Attending: Internal Medicine | Admitting: Internal Medicine

## 2016-05-09 DIAGNOSIS — I5042 Chronic combined systolic (congestive) and diastolic (congestive) heart failure: Secondary | ICD-10-CM

## 2016-05-09 DIAGNOSIS — I5022 Chronic systolic (congestive) heart failure: Secondary | ICD-10-CM | POA: Diagnosis not present

## 2016-05-11 ENCOUNTER — Telehealth (HOSPITAL_COMMUNITY): Payer: Self-pay | Admitting: *Deleted

## 2016-05-11 ENCOUNTER — Encounter (HOSPITAL_COMMUNITY)
Admission: RE | Admit: 2016-05-11 | Discharge: 2016-05-11 | Disposition: A | Discharge status: Home / Self Care | Payer: Medicare HMO | Source: Ambulatory Visit

## 2016-05-14 ENCOUNTER — Encounter (HOSPITAL_COMMUNITY)
Admission: RE | Admit: 2016-05-14 | Discharge: 2016-05-14 | Disposition: A | Discharge status: Home / Self Care | Payer: Medicare HMO | Source: Ambulatory Visit | Attending: Internal Medicine | Admitting: Internal Medicine

## 2016-05-14 DIAGNOSIS — I5042 Chronic combined systolic (congestive) and diastolic (congestive) heart failure: Secondary | ICD-10-CM

## 2016-05-14 DIAGNOSIS — I5022 Chronic systolic (congestive) heart failure: Secondary | ICD-10-CM | POA: Diagnosis not present

## 2016-05-16 ENCOUNTER — Encounter (HOSPITAL_COMMUNITY)
Admission: RE | Admit: 2016-05-16 | Discharge: 2016-05-16 | Disposition: A | Discharge status: Home / Self Care | Payer: Medicare HMO | Source: Ambulatory Visit | Attending: Internal Medicine | Admitting: Internal Medicine

## 2016-05-16 DIAGNOSIS — I5042 Chronic combined systolic (congestive) and diastolic (congestive) heart failure: Secondary | ICD-10-CM

## 2016-05-16 DIAGNOSIS — I5022 Chronic systolic (congestive) heart failure: Secondary | ICD-10-CM | POA: Diagnosis not present

## 2016-05-17 ENCOUNTER — Inpatient Hospital Stay (HOSPITAL_COMMUNITY): Admission: RE | Admit: 2016-05-17 | Payer: Medicare HMO | Source: Ambulatory Visit | Admitting: Internal Medicine

## 2016-05-18 ENCOUNTER — Encounter (HOSPITAL_COMMUNITY)
Admission: RE | Admit: 2016-05-18 | Discharge: 2016-05-18 | Disposition: A | Discharge status: Home / Self Care | Payer: Medicare HMO | Source: Ambulatory Visit | Attending: Internal Medicine | Admitting: Internal Medicine

## 2016-05-18 DIAGNOSIS — I5042 Chronic combined systolic (congestive) and diastolic (congestive) heart failure: Secondary | ICD-10-CM

## 2016-05-18 DIAGNOSIS — I5022 Chronic systolic (congestive) heart failure: Secondary | ICD-10-CM | POA: Diagnosis not present

## 2016-05-21 ENCOUNTER — Telehealth (HOSPITAL_COMMUNITY): Payer: Self-pay | Admitting: Family Medicine

## 2016-05-21 ENCOUNTER — Encounter (HOSPITAL_COMMUNITY): Payer: Medicare HMO

## 2016-05-21 NOTE — Telephone Encounter (Signed)
Pt cancelled due to [facility] Emergency... KJ

## 2016-05-23 ENCOUNTER — Encounter (HOSPITAL_COMMUNITY): Payer: Medicare HMO

## 2016-05-25 ENCOUNTER — Encounter (HOSPITAL_COMMUNITY)
Admission: RE | Admit: 2016-05-25 | Discharge: 2016-05-25 | Disposition: A | Discharge status: Home / Self Care | Payer: Medicare HMO | Source: Ambulatory Visit | Attending: Internal Medicine | Admitting: Internal Medicine

## 2016-05-25 DIAGNOSIS — I5022 Chronic systolic (congestive) heart failure: Secondary | ICD-10-CM | POA: Diagnosis not present

## 2016-05-25 DIAGNOSIS — I5042 Chronic combined systolic (congestive) and diastolic (congestive) heart failure: Secondary | ICD-10-CM

## 2016-05-28 ENCOUNTER — Other Ambulatory Visit (HOSPITAL_COMMUNITY): Payer: Self-pay | Admitting: Internal Medicine

## 2016-05-30 ENCOUNTER — Encounter (HOSPITAL_COMMUNITY)
Admission: RE | Admit: 2016-05-30 | Discharge: 2016-05-30 | Disposition: A | Discharge status: Home / Self Care | Payer: Medicare HMO | Source: Ambulatory Visit | Attending: Internal Medicine | Admitting: Internal Medicine

## 2016-05-30 VITALS — Wt 151.5 lb

## 2016-05-30 DIAGNOSIS — I5042 Chronic combined systolic (congestive) and diastolic (congestive) heart failure: Secondary | ICD-10-CM

## 2016-05-30 DIAGNOSIS — I5022 Chronic systolic (congestive) heart failure: Secondary | ICD-10-CM | POA: Diagnosis not present

## 2016-05-31 NOTE — Progress Notes (Signed)
Discharge Summary  Patient Details  Name: Dale [name] MRN: [id number] Date of Birth: 22-Dec-1945 Referring Provider:   Flowsheet Row CARDIAC REHAB PHASE II ORIENTATION from 02/16/2016 in [place]  Referring Provider  Glori Bickers MD       Number of Visits: 36  Reason for Discharge:  Patient independent in their exercise.  Smoking History:  History  Smoking Status  . Former Smoker  . Years: 36.00  . Quit date: 06/22/1994  Smokeless Tobacco  . Never Used    Diagnosis:  01/2016 Heart failure, systolic and diastolic, chronic ([place])  ADL [facility]:   Initial Exercise Prescription:     Initial Exercise Prescription - 02/16/16 1500      Date of Initial Exercise RX and Referring Provider   Date 02/16/16   Referring Provider Bensimhon, Daniel MD     Recumbant Bike   Level 2   RPM 30   Minutes 10   METs 2     NuStep   Level 3   Minutes 10   METs 2     Track   Laps 8   Minutes 10   METs 2.39     Prescription Details   Frequency (times per week) 3   Duration Progress to 30 minutes of continuous aerobic without signs/symptoms of physical distress     Intensity   THRR 40-80% of Max Heartrate 60-120   Ratings of Perceived Exertion 11-13   Perceived Dyspnea 0-4     Progression   Progression Continue to progress workloads to maintain intensity without signs/symptoms of physical distress.     Resistance Training   Training Prescription Yes   Weight 2lbs   Reps 10-12      Discharge Exercise Prescription (Final Exercise Prescription Changes):     Exercise Prescription Changes - 06/20/16 0900      Exercise Review   Progression Yes     Response to Exercise   Blood Pressure (Admit) 118/60   Blood Pressure (Exercise) 108/64   Blood Pressure (Exit) 112/62   Heart Rate (Admit) 74 bpm   Heart Rate (Exercise) 133 bpm   Heart Rate (Exit) 75 bpm   Rating of Perceived Exertion (Exercise) 11   Symptoms none   Comments  Reviewed home exercise guidelines on 03/05/16.   Duration Progress to 30 minutes of continuous aerobic without signs/symptoms of physical distress   Intensity THRR unchanged     Progression   Progression Continue to progress workloads to maintain intensity without signs/symptoms of physical distress.   Average METs 3.6     Resistance Training   Training Prescription Yes   Weight 3lbs   Reps 10-12     Interval Training   Interval Training No     Recumbant Bike   Level 3.5   Minutes 10   METs 4     NuStep   Level 5   Minutes 10   METs 3.3     Track   Laps 14   Minutes 10   METs 3.43     Home Exercise Plan   Plans to continue exercise at Home   Frequency Add 3 additional days to program exercise sessions.      Functional Capacity:     6 Minute Walk    Row Name 02/16/16 1458 05/16/16 1647       6 Minute Walk   Phase Initial Discharge    Distance 1319 feet 1943 feet    Distance % Change  -  47.3 %    Walk Time 6 minutes 6 minutes    # of Rest Breaks 0 0    MPH 2.49 3.7    METS 2.94  -    RPE 13 10    VO2 Peak 10.3 15.9    Symptoms No No    Resting HR 62 bpm 80 bpm    Resting BP 98/60 102/62    Max Ex. HR 76 bpm 111 bpm    Max Ex. BP 122/60 152/70    2 Minute Post BP 116/62 128/72       Psychological, QOL, Others - Outcomes: PHQ 2/9: Depression screen [facility] 2/9 06/28/2016 02/21/2016 12/15/2015 12/15/2015 09/06/2015  Decreased Interest 0 0 0 0 1  Down, Depressed, Hopeless 0 0 0 0 1  PHQ - 2 Score 0 0 0 0 2  Altered sleeping - - - - 3  Tired, decreased energy - - - - 2  Change in appetite - - - - 3  Feeling bad or failure about yourself  - - - - 1  Trouble concentrating - - - - 1  Moving slowly or fidgety/restless - - - - 0  Suicidal thoughts - - - - 0  PHQ-9 Score - - - - 12  Difficult doing work/chores - - - - Somewhat difficult    Quality of Life:     Quality of Life - 03/06/16 1137      Quality of Life Scores   Health/Function Pre 10.29 %   Patient dissatisfied with his heart problems and is still complaining of shortness of breath and low energy.   GLOBAL Pre --  Reviewed results with patient.      Personal Goals: Goals established at orientation with interventions provided to work toward goal.     Personal Goals and Risk Factors at Admission - 02/16/16 0825      Core Components/Risk Factors/Patient Goals on Admission    Weight Management Weight Gain;Yes   Intervention Weight Management: Develop a combined nutrition and exercise program designed to reach desired caloric intake, while maintaining appropriate intake of nutrient and fiber, sodium and fats, and appropriate energy expenditure required for the weight goal.;Weight Management: Provide education and appropriate resources to help participant work on and attain dietary goals.;Weight Management/Obesity: Establish reasonable short term and long term weight goals.   Expected Outcomes Short Term: Continue to assess and modify interventions until short term weight is achieved;Long Term: Adherence to nutrition and physical activity/exercise program aimed toward attainment of established weight goal;Weight Maintenance: Understanding of the daily nutrition guidelines, which includes 25-35% calories from fat, 7% or less cal from saturated fats, less than 262m cholesterol, less than 1.5gm of sodium, & 5 or more servings of fruits and vegetables daily;Understanding recommendations for meals to include 15-35% energy as protein, 25-35% energy from fat, 35-60% energy from carbohydrates, less than 2050mof dietary cholesterol, 20-35 gm of total fiber daily;Understanding of distribution of calorie intake throughout the day with the consumption of 4-5 meals/snacks;Weight Gain: Understanding of general recommendations for a high calorie, high protein meal plan that promotes weight gain by distributing calorie intake throughout the day with the consumption for 4-5 meals, snacks, and/or  supplements   Sedentary Yes   Intervention Provide advice, education, support and counseling about physical activity/exercise needs.;Develop an individualized exercise prescription for aerobic and resistive training based on initial evaluation findings, risk stratification, comorbidities and participant's personal goals.   Expected Outcomes Achievement of increased cardiorespiratory fitness and enhanced flexibility, muscular  endurance and strength shown through measurements of functional capacity and personal statement of participant.   Increase Strength and Stamina Yes   Intervention Provide advice, education, support and counseling about physical activity/exercise needs.;Develop an individualized exercise prescription for aerobic and resistive training based on initial evaluation findings, risk stratification, comorbidities and participant's personal goals.   Expected Outcomes Achievement of increased cardiorespiratory fitness and enhanced flexibility, muscular endurance and strength shown through measurements of functional capacity and personal statement of participant.   Heart Failure Yes   Intervention Provide a combined exercise and nutrition program that is supplemented with education, support and counseling about heart failure. Directed toward relieving symptoms such as shortness of breath, decreased exercise tolerance, and extremity edema.   Expected Outcomes Improve functional capacity of life;Short term: Attendance in program 2-3 days a week with increased exercise capacity. Reported lower sodium intake. Reported increased fruit and vegetable intake. Reports medication compliance.;Short term: Daily weights obtained and reported for increase. Utilizing diuretic protocols set by physician.;Long term: Adoption of self-care skills and reduction of barriers for early signs and symptoms recognition and intervention leading to self-care maintenance.   Hypertension Yes   Intervention Provide education  on lifestyle modifcations including regular physical activity/exercise, weight management, moderate sodium restriction and increased consumption of fresh fruit, vegetables, and low fat dairy, alcohol moderation, and smoking cessation.;Monitor prescription use compliance.   Expected Outcomes Short Term: Continued assessment and intervention until BP is < 140/94m HG in hypertensive participants. < 130/851mHG in hypertensive participants with diabetes, heart failure or chronic kidney disease.;Long Term: Maintenance of blood pressure at goal levels.   Lipids Yes   Intervention Provide education and support for participant on nutrition & aerobic/resistive exercise along with prescribed medications to achieve LDL <7059mHDL >62m43m Expected Outcomes Short Term: Participant states understanding of desired cholesterol values and is compliant with medications prescribed. Participant is following exercise prescription and nutrition guidelines.;Long Term: Cholesterol controlled with medications as prescribed, with individualized exercise RX and with personalized nutrition plan. Value goals: LDL < 70mg41mL > 40 mg.   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   Intervention Provide an exercise program to improve aerobic capacity and nutrition counseling to assist with weight gain   Expected Outcomes Pt will walk more with less fatigue and gain weight       Personal Goals Discharge:     Goals and Risk Factor Review    Row Name 03/13/16 1215 04/11/16 1734 05/09/16 1700 06/20/16 1343       Core Components/Risk Factors/Patient Goals Review   Personal Goals Review Sedentary;Weight Management/Obesity Sedentary;Weight Management/Obesity;Increase Strength and Stamina;Other Sedentary;Weight Management/Obesity;Increase Strength and Stamina;Other Weight Management/Obesity    Review Participant is walking 30  minutes, 3 days per week in addition to cardiac rehab. Participant has gained 3.7 lbs. His goal is to gain 5-10 lbs. Dale [name] be returning to work this Saturday, which was one of his personal goals for the program. He is exercising 3 days at home and 3 days at cardiac rehab. He states that he is able to lift 10lbs wihtout problem. Participant has gained 5.9lbs, which was one of his goals. Pt has returned to work and states he feels great.  He is exercising 3 days/week outside of [facility] and he states he feels great and he has more stamina.   Pt with desired 7.2 lb wt gain.  Expected Outcomes Continue aerobic and resistance training program to help increase stength and stamina and help achieve goal of returning to work. Participant is returning to work and strength and stamina have improved per pt statement. He also wanted to gain 5-10lb and has currently gained 5.9lbs. Continue exercise at [facility] and [facility] in order to gain and mainting strength, stamina and overall cardiovascular fitness.   Encourage wt maintenance now that pt has achieved his desired wt.        Nutrition & Weight - Outcomes:     Pre Biometrics - 02/16/16 1500      Pre Biometrics   Waist Circumference 32.75 inches   Hip Circumference 37.75 inches   Waist to Hip Ratio 0.87 %   Triceps Skinfold 8 mm   % Body Fat 19.6 %   Grip Strength 35 kg   Flexibility 11 in   Single Leg Stand 30 seconds         Post Biometrics - 06/28/16 1400       Post  Biometrics   Height _0  (1.727 m)   Weight 150 lb 2.1 oz (68.1 kg)   BMI (Calculated) 22.9      Nutrition:     Nutrition Therapy & Goals - 02/20/16 0827      Nutrition Therapy   Diet High Calorie, High Protein     Personal Nutrition Goals   Personal Goal #1 1-2 lb wt gain/week to a wt gain goal of 6-24 lb at graduation from [place], educate and counsel regarding individualized specific dietary modifications aiming  towards targeted core components such as weight, hypertension, lipid management, diabetes, heart failure and other comorbidities.   Expected Outcomes Short Term Goal: Understand basic principles of dietary content, such as calories, fat, sodium, cholesterol and nutrients.;Long Term Goal: Adherence to prescribed nutrition plan.      Nutrition Discharge:     Nutrition Assessments - 02/20/16 0827      MEDFICTS Scores   Pre Score 36      Education Questionnaire Score:     Knowledge Questionnaire Score - 02/16/16 1437      Knowledge Questionnaire Score   Pre Score 17/24      Goals reviewed with patient; copy given to patient.Leane Para graduates from cardiac rehab program today with completion of 36 exercise sessions in Phase II. Pt maintained good attendance and progressed nicely during his participation in rehab as evidenced by increased MET level.   Medication list reconciled. Repeat  PHQ score- 0  Dale [name] has made significant lifestyle changes and should be commended for his success. Pt feels he has achieved his goals during cardiac rehab. 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 We are proud of Dale [name]'s progress and are glad he is feeling stronger.Dale Gave RN BSN

## 2016-06-01 ENCOUNTER — Encounter (HOSPITAL_COMMUNITY): Payer: Medicare HMO

## 2016-06-06 ENCOUNTER — Encounter (HOSPITAL_COMMUNITY): Payer: Medicare HMO

## 2016-06-08 ENCOUNTER — Other Ambulatory Visit (HOSPITAL_COMMUNITY): Payer: Self-pay | Admitting: Internal Medicine

## 2016-06-08 ENCOUNTER — Encounter (HOSPITAL_COMMUNITY)
Admission: RE | Admit: 2016-06-08 | Discharge: 2016-06-08 | Disposition: A | Discharge status: Home / Self Care | Payer: Medicare HMO | Source: Ambulatory Visit | Attending: Internal Medicine | Admitting: Internal Medicine

## 2016-06-08 ENCOUNTER — Encounter (HOSPITAL_COMMUNITY): Payer: Medicare HMO

## 2016-06-08 ENCOUNTER — Other Ambulatory Visit (HOSPITAL_COMMUNITY): Payer: Self-pay | Admitting: Student

## 2016-06-08 VITALS — Ht 68.0 in | Wt 150.1 lb

## 2016-06-08 DIAGNOSIS — I5042 Chronic combined systolic (congestive) and diastolic (congestive) heart failure: Secondary | ICD-10-CM

## 2016-06-08 DIAGNOSIS — I5022 Chronic systolic (congestive) heart failure: Secondary | ICD-10-CM | POA: Insufficient documentation

## 2016-06-11 ENCOUNTER — Encounter (HOSPITAL_COMMUNITY): Payer: Medicare HMO

## 2016-06-12 ENCOUNTER — Other Ambulatory Visit (HOSPITAL_COMMUNITY): Payer: Self-pay | Admitting: *Deleted

## 2016-06-13 ENCOUNTER — Encounter (HOSPITAL_COMMUNITY): Payer: Medicare HMO

## 2016-06-15 ENCOUNTER — Encounter (HOSPITAL_COMMUNITY): Payer: Self-pay | Admitting: Internal Medicine

## 2016-06-15 ENCOUNTER — Encounter (HOSPITAL_COMMUNITY): Payer: Medicare HMO

## 2016-06-15 ENCOUNTER — Encounter (HOSPITAL_COMMUNITY)
Admission: RE | Admit: 2016-06-15 | Discharge: 2016-06-15 | Disposition: A | Discharge status: Home / Self Care | Payer: Self-pay | Source: Ambulatory Visit | Attending: Internal Medicine | Admitting: Internal Medicine

## 2016-06-15 ENCOUNTER — Ambulatory Visit (HOSPITAL_COMMUNITY)
Admission: RE | Admit: 2016-06-15 | Discharge: 2016-06-15 | Disposition: A | Discharge status: Home / Self Care | Payer: Medicare HMO | Source: Ambulatory Visit | Attending: Internal Medicine | Admitting: Internal Medicine

## 2016-06-15 VITALS — BP 114/60 | HR 84 | Wt 153.8 lb

## 2016-06-15 DIAGNOSIS — R42 Dizziness and giddiness: Secondary | ICD-10-CM | POA: Insufficient documentation

## 2016-06-15 DIAGNOSIS — R040 Epistaxis: Secondary | ICD-10-CM | POA: Diagnosis not present

## 2016-06-15 DIAGNOSIS — Z79899 Other long term (current) drug therapy: Secondary | ICD-10-CM | POA: Diagnosis not present

## 2016-06-15 DIAGNOSIS — I5022 Chronic systolic (congestive) heart failure: Secondary | ICD-10-CM | POA: Insufficient documentation

## 2016-06-15 DIAGNOSIS — Z888 Allergy status to other drugs, medicaments and biological substances status: Secondary | ICD-10-CM | POA: Diagnosis not present

## 2016-06-15 DIAGNOSIS — Z87891 Personal history of nicotine dependence: Secondary | ICD-10-CM | POA: Diagnosis not present

## 2016-06-15 DIAGNOSIS — Z9889 Other specified postprocedural states: Secondary | ICD-10-CM | POA: Diagnosis not present

## 2016-06-15 DIAGNOSIS — I480 Paroxysmal atrial fibrillation: Secondary | ICD-10-CM | POA: Diagnosis not present

## 2016-06-15 DIAGNOSIS — I110 Hypertensive heart disease with heart failure: Secondary | ICD-10-CM | POA: Insufficient documentation

## 2016-06-15 DIAGNOSIS — Z87442 Personal history of urinary calculi: Secondary | ICD-10-CM | POA: Insufficient documentation

## 2016-06-15 DIAGNOSIS — B1920 Unspecified viral hepatitis C without hepatic coma: Secondary | ICD-10-CM | POA: Insufficient documentation

## 2016-06-15 DIAGNOSIS — I5042 Chronic combined systolic (congestive) and diastolic (congestive) heart failure: Secondary | ICD-10-CM | POA: Diagnosis not present

## 2016-06-15 LAB — BASIC METABOLIC PANEL
Anion gap: 8 (ref 5–15)
BUN: 9 mg/dL (ref 6–20)
CHLORIDE: 108 mmol/L (ref 101–111)
CO2: 23 mmol/L (ref 22–32)
CREATININE: 1.02 mg/dL (ref 0.61–1.24)
Calcium: 9 mg/dL (ref 8.9–10.3)
GFR calc non Af Amer: >60 mL/min (ref >60)
Glucose, Bld: 123 mg/dL — ABNORMAL HIGH (ref 65–99)
Potassium: 3.9 mmol/L (ref 3.5–5.1)
SODIUM: 139 mmol/L (ref 135–145)

## 2016-06-15 LAB — DIGOXIN LEVEL: DIGOXIN LVL: 1 ng/mL (ref 0.8–2.0)

## 2016-06-15 MED ORDER — SACUBITRIL-VALSARTAN 24-26 MG PO TABS: 1.0000 | ORAL_TABLET | Freq: Two times a day (BID) | ORAL | 3 refills | Status: DC

## 2016-06-15 MED ORDER — SILDENAFIL CITRATE 50 MG PO TABS: 50.0000 mg | ORAL_TABLET | Freq: Every day | ORAL | 3 refills | Status: DC | PRN

## 2016-06-15 NOTE — Patient Instructions (Signed)
Labs today (will call for abnormal results, otherwise no news is good news)  STOP taking Losartan  START taking Entresto 24-26mg  (1 Tablet) Two times daily. Let us know if your co-pay is too high after your free 30-day supply.  Start taking Viagra 50 mg (1 Tablet) as needed.  Don't start for 2 weeks so that you are stable on the Entresto.   Follow up in 3 months with Echo
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
## 2016-06-15 NOTE — Addendum Note (Signed)
Encounter addended by: Suezanne Cheshire, RN on: 06/15/2016 11:17 AM<BR>    Actions taken: Medication long-term status modified, Visit diagnoses modified, Order list changed, Diagnosis association updated, Sign clinical note

## 2016-06-15 NOTE — Addendum Note (Signed)
Encounter addended by: Suezanne Cheshire, RN on: 06/15/2016 11:26 AM<BR>    Actions taken: Order list changed, Diagnosis association updated

## 2016-06-17 ENCOUNTER — Telehealth: Payer: Self-pay | Admitting: Physician Assistant

## 2016-06-17 ENCOUNTER — Emergency Department (HOSPITAL_COMMUNITY): Payer: Medicare HMO

## 2016-06-17 ENCOUNTER — Emergency Department (HOSPITAL_COMMUNITY)
Admission: EM | Admit: 2016-06-17 | Discharge: 2016-06-17 | Disposition: A | Discharge status: Home / Self Care | Payer: Medicare HMO | Attending: Emergency Medicine | Admitting: Emergency Medicine

## 2016-06-17 ENCOUNTER — Encounter (HOSPITAL_COMMUNITY): Payer: Self-pay | Admitting: Emergency Medicine

## 2016-06-17 DIAGNOSIS — I110 Hypertensive heart disease with heart failure: Secondary | ICD-10-CM | POA: Diagnosis not present

## 2016-06-17 DIAGNOSIS — Z7901 Long term (current) use of anticoagulants: Secondary | ICD-10-CM | POA: Insufficient documentation

## 2016-06-17 DIAGNOSIS — I5041 Acute combined systolic (congestive) and diastolic (congestive) heart failure: Secondary | ICD-10-CM | POA: Insufficient documentation

## 2016-06-17 DIAGNOSIS — Z79899 Other long term (current) drug therapy: Secondary | ICD-10-CM | POA: Diagnosis not present

## 2016-06-17 DIAGNOSIS — R0789 Other chest pain: Secondary | ICD-10-CM | POA: Insufficient documentation

## 2016-06-17 DIAGNOSIS — R079 Chest pain, unspecified: Secondary | ICD-10-CM | POA: Diagnosis present

## 2016-06-17 DIAGNOSIS — Z87891 Personal history of nicotine dependence: Secondary | ICD-10-CM | POA: Insufficient documentation

## 2016-06-17 LAB — CBC
HCT: 32.6 % — ABNORMAL LOW (ref 39.0–52.0)
HEMOGLOBIN: 9.4 g/dL — AB (ref 13.0–17.0)
MCH: 20.9 pg — ABNORMAL LOW (ref 26.0–34.0)
MCHC: 28.8 g/dL — ABNORMAL LOW (ref 30.0–36.0)
MCV: 72.6 fL — AB (ref 78.0–100.0)
PLATELETS: 220 10*3/uL (ref 150–400)
RBC: 4.49 MIL/uL (ref 4.22–5.81)
RDW: 17.9 % — ABNORMAL HIGH (ref 11.5–15.5)
WBC: 5.3 10*3/uL (ref 4.0–10.5)

## 2016-06-17 LAB — BASIC METABOLIC PANEL
ANION GAP: 9 (ref 5–15)
BUN: 13 mg/dL (ref 6–20)
CHLORIDE: 107 mmol/L (ref 101–111)
CO2: 23 mmol/L (ref 22–32)
CREATININE: 1.06 mg/dL (ref 0.61–1.24)
Calcium: 9.3 mg/dL (ref 8.9–10.3)
GFR calc non Af Amer: >60 mL/min (ref >60)
Glucose, Bld: 92 mg/dL (ref 65–99)
POTASSIUM: 4.2 mmol/L (ref 3.5–5.1)
Sodium: 139 mmol/L (ref 135–145)

## 2016-06-17 LAB — I-STAT TROPONIN, ED: Troponin i, poc: 0 ng/mL (ref 0.00–0.08)

## 2016-06-17 MED ORDER — PANTOPRAZOLE SODIUM 20 MG PO TBEC: 20.0000 mg | DELAYED_RELEASE_TABLET | Freq: Every day | ORAL | 0 refills | Status: DC

## 2016-06-17 NOTE — ED Triage Notes (Signed)
Pt c/o center chest pain that radiates to right chest onset today. Pt denies any other symptoms.
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
## 2016-06-17 NOTE — Telephone Encounter (Signed)
Dale [name] is a 71 y.o. male with systolic CHF, AFib. His wife called in today b/c the patient has been having chest pain. He is at work at [employer] right now.   He has not been short of breath or having any edema. I recommended that she have him go to the ED. While on the phone, she arrived at [facility] and an ambulance was already there. Tereso Newcomer, PA-C   06/17/2016 3:18 PM

## 2016-06-18 ENCOUNTER — Telehealth (HOSPITAL_COMMUNITY): Payer: Self-pay | Admitting: Pharmacist

## 2016-06-18 ENCOUNTER — Encounter (HOSPITAL_COMMUNITY): Payer: Medicare HMO

## 2016-06-18 ENCOUNTER — Encounter (HOSPITAL_COMMUNITY): Payer: Self-pay

## 2016-06-18 NOTE — Telephone Encounter (Signed)
Entresto 24-26 mg BID PA approved by [facility] Part D through 06/18/18.   [name]. Bonnye Fava, PharmD, BCPS, CPP Clinical Pharmacist Pager: [phone] Phone: [phone] 06/18/2016 11:32 AM

## 2016-06-20 ENCOUNTER — Encounter (HOSPITAL_COMMUNITY): Payer: Medicare HMO

## 2016-06-20 ENCOUNTER — Encounter (HOSPITAL_COMMUNITY): Payer: Self-pay

## 2016-06-20 NOTE — Addendum Note (Signed)
Encounter addended by: Ples Specter on: 06/20/2016  9:26 AM<BR>    Actions taken: Flowsheet data copied forward, Flowsheet accepted, Visit Navigator Flowsheet section accepted

## 2016-06-22 ENCOUNTER — Telehealth (HOSPITAL_COMMUNITY): Payer: Self-pay | Admitting: *Deleted

## 2016-06-22 ENCOUNTER — Encounter (HOSPITAL_COMMUNITY): Payer: Self-pay

## 2016-06-22 ENCOUNTER — Encounter (HOSPITAL_COMMUNITY): Payer: Medicare HMO

## 2016-06-22 MED ORDER — DIGOXIN 125 MCG PO TABS: 0.0625 mg | ORAL_TABLET | Freq: Every day | ORAL | 2 refills | Status: DC

## 2016-06-22 NOTE — Telephone Encounter (Signed)
Notes Recorded by Modesta Messing, CMA on 06/22/2016 at 2:17 PM EST Patient aware and agreeable. Medication updated in patients chart. ------  Notes Recorded by Suezanne Cheshire, RN on 06/18/2016 at 9:35 AM EST Called and left message to call us back. ------  Notes Recorded by Dolores Patty, MD on 06/16/2016 at 10:01 AM EST Please cut digoxin dose in half.  Newer results are available. Click to view them now.    Ref Range & Units 7d ago 68mo ago 2mo ago   Sodium 135 - 145 mmol/L 139  139  138    Potassium 3.5 - 5.1 mmol/L 3.9  4.0  4.4    Chloride 101 - 111 mmol/L 108  107  109    CO2 22 - 32 mmol/L 23  25  22     Glucose, Bld 65 - 99 mg/dL 967   893   80    BUN 6 - 20 mg/dL 9  15  11     Creatinine, Ser 0.61 - 1.24 mg/dL 8.10  1.75  1.02    Calcium 8.9 - 10.3 mg/dL 9.0  9.7  9.4    GFR calc non Af Amer >60 mL/min >60  >60  >60    GFR calc Af Amer >60 mL/min >60  >60CM  >60CM

## 2016-06-22 NOTE — Telephone Encounter (Signed)
-----   Message from Dolores Patty, MD sent at 06/16/2016 10:01 AM EST ----- Please cut digoxin dose in half.

## 2016-06-25 ENCOUNTER — Encounter (HOSPITAL_COMMUNITY)
Admission: RE | Admit: 2016-06-25 | Discharge: 2016-06-25 | Disposition: A | Discharge status: Home / Self Care | Payer: Self-pay | Source: Ambulatory Visit | Attending: Internal Medicine | Admitting: Internal Medicine

## 2016-06-27 ENCOUNTER — Encounter (HOSPITAL_COMMUNITY)
Admission: RE | Admit: 2016-06-27 | Discharge: 2016-06-27 | Disposition: A | Discharge status: Home / Self Care | Payer: Self-pay | Source: Ambulatory Visit | Attending: Internal Medicine | Admitting: Internal Medicine

## 2016-06-28 NOTE — Addendum Note (Signed)
Encounter addended by: Cammy Copa, RN on: 06/28/2016 12:49 PM<BR>    Actions taken: Visit Navigator Flowsheet section accepted

## 2016-06-29 ENCOUNTER — Encounter (HOSPITAL_COMMUNITY)
Admission: RE | Admit: 2016-06-29 | Discharge: 2016-06-29 | Disposition: A | Discharge status: Home / Self Care | Payer: Self-pay | Source: Ambulatory Visit | Attending: Internal Medicine | Admitting: Internal Medicine

## 2016-07-02 ENCOUNTER — Encounter (HOSPITAL_COMMUNITY): Payer: Self-pay

## 2016-07-04 ENCOUNTER — Encounter (HOSPITAL_COMMUNITY): Payer: Self-pay

## 2016-07-06 ENCOUNTER — Encounter (HOSPITAL_COMMUNITY)
Admission: RE | Admit: 2016-07-06 | Discharge: 2016-07-06 | Disposition: A | Discharge status: Home / Self Care | Payer: Self-pay | Source: Ambulatory Visit | Attending: Internal Medicine | Admitting: Internal Medicine

## 2016-07-06 DIAGNOSIS — I5022 Chronic systolic (congestive) heart failure: Secondary | ICD-10-CM | POA: Insufficient documentation

## 2016-07-09 ENCOUNTER — Encounter (HOSPITAL_COMMUNITY)
Admission: RE | Admit: 2016-07-09 | Discharge: 2016-07-09 | Disposition: A | Discharge status: Home / Self Care | Payer: Self-pay | Source: Ambulatory Visit | Attending: Internal Medicine | Admitting: Internal Medicine

## 2016-07-11 ENCOUNTER — Encounter (HOSPITAL_COMMUNITY)
Admission: RE | Admit: 2016-07-11 | Discharge: 2016-07-11 | Disposition: A | Discharge status: Home / Self Care | Payer: Self-pay | Source: Ambulatory Visit | Attending: Internal Medicine | Admitting: Internal Medicine

## 2016-07-13 ENCOUNTER — Other Ambulatory Visit (HOSPITAL_COMMUNITY): Payer: Self-pay | Admitting: Adult Health

## 2016-07-13 ENCOUNTER — Encounter (HOSPITAL_COMMUNITY)
Admission: RE | Admit: 2016-07-13 | Discharge: 2016-07-13 | Disposition: A | Discharge status: Home / Self Care | Payer: Self-pay | Source: Ambulatory Visit | Attending: Internal Medicine | Admitting: Internal Medicine

## 2016-07-16 ENCOUNTER — Encounter (HOSPITAL_COMMUNITY)
Admission: RE | Admit: 2016-07-16 | Discharge: 2016-07-16 | Disposition: A | Discharge status: Home / Self Care | Payer: Self-pay | Source: Ambulatory Visit | Attending: Internal Medicine | Admitting: Internal Medicine

## 2016-07-18 ENCOUNTER — Encounter (HOSPITAL_COMMUNITY): Payer: Self-pay

## 2016-07-19 ENCOUNTER — Other Ambulatory Visit (HOSPITAL_COMMUNITY): Payer: Self-pay | Admitting: Internal Medicine

## 2016-07-20 ENCOUNTER — Encounter (HOSPITAL_COMMUNITY): Payer: Self-pay

## 2016-07-23 ENCOUNTER — Encounter (HOSPITAL_COMMUNITY)
Admission: RE | Admit: 2016-07-23 | Discharge: 2016-07-23 | Disposition: A | Discharge status: Home / Self Care | Payer: Self-pay | Source: Ambulatory Visit | Attending: Internal Medicine | Admitting: Internal Medicine

## 2016-07-25 ENCOUNTER — Encounter (HOSPITAL_COMMUNITY): Payer: Self-pay

## 2016-07-27 ENCOUNTER — Encounter (HOSPITAL_COMMUNITY): Payer: Self-pay

## 2016-07-30 ENCOUNTER — Encounter (HOSPITAL_COMMUNITY)
Admission: RE | Admit: 2016-07-30 | Discharge: 2016-07-30 | Disposition: A | Discharge status: Home / Self Care | Payer: Self-pay | Source: Ambulatory Visit | Attending: Internal Medicine | Admitting: Internal Medicine

## 2016-08-01 ENCOUNTER — Encounter (HOSPITAL_COMMUNITY)
Admission: RE | Admit: 2016-08-01 | Discharge: 2016-08-01 | Disposition: A | Discharge status: Home / Self Care | Payer: Self-pay | Source: Ambulatory Visit | Attending: Internal Medicine | Admitting: Internal Medicine

## 2016-08-03 ENCOUNTER — Encounter (HOSPITAL_COMMUNITY)
Admission: RE | Admit: 2016-08-03 | Discharge: 2016-08-03 | Disposition: A | Discharge status: Home / Self Care | Payer: Self-pay | Source: Ambulatory Visit | Attending: Internal Medicine | Admitting: Internal Medicine

## 2016-08-03 DIAGNOSIS — I5022 Chronic systolic (congestive) heart failure: Secondary | ICD-10-CM | POA: Insufficient documentation

## 2016-08-06 ENCOUNTER — Encounter (HOSPITAL_COMMUNITY): Admission: RE | Admit: 2016-08-06 | Payer: Self-pay | Source: Ambulatory Visit

## 2016-08-08 ENCOUNTER — Encounter (HOSPITAL_COMMUNITY)
Admission: RE | Admit: 2016-08-08 | Discharge: 2016-08-08 | Disposition: A | Discharge status: Home / Self Care | Payer: Self-pay | Source: Ambulatory Visit | Attending: Internal Medicine | Admitting: Internal Medicine

## 2016-08-10 ENCOUNTER — Encounter (HOSPITAL_COMMUNITY): Payer: Self-pay

## 2016-08-13 ENCOUNTER — Encounter (HOSPITAL_COMMUNITY): Payer: Self-pay

## 2016-08-14 ENCOUNTER — Telehealth (HOSPITAL_COMMUNITY): Payer: Self-pay | Admitting: *Deleted

## 2016-08-14 DIAGNOSIS — R5383 Other fatigue: Secondary | ICD-10-CM

## 2016-08-14 DIAGNOSIS — I5042 Chronic combined systolic (congestive) and diastolic (congestive) heart failure: Secondary | ICD-10-CM

## 2016-08-14 NOTE — Telephone Encounter (Signed)
And a TSH Level

## 2016-08-14 NOTE — Addendum Note (Signed)
Addended by: Suezanne Cheshire on: 08/14/2016 04:20 PM   Modules accepted: Orders

## 2016-08-14 NOTE — Telephone Encounter (Signed)
Wife called in reporting patient is complaining of being very cold and is constantly eating ice. He complains of being tired all the time, more so than normal. He doesn't complain of having shortness of breath or weight gain.  I spoke with Dr. Gala Romney and he said to bring patient in for lab work.  Patient's wife is agreeable and appt made for tomorrow.  BMET/BNP/CBC orders placed.

## 2016-08-15 ENCOUNTER — Encounter (HOSPITAL_COMMUNITY): Payer: Self-pay

## 2016-08-15 ENCOUNTER — Ambulatory Visit (HOSPITAL_COMMUNITY)
Admission: RE | Admit: 2016-08-15 | Discharge: 2016-08-15 | Disposition: A | Discharge status: Home / Self Care | Payer: Medicare HMO | Source: Ambulatory Visit | Attending: Internal Medicine | Admitting: Internal Medicine

## 2016-08-15 DIAGNOSIS — R5383 Other fatigue: Secondary | ICD-10-CM

## 2016-08-15 DIAGNOSIS — I5042 Chronic combined systolic (congestive) and diastolic (congestive) heart failure: Secondary | ICD-10-CM | POA: Diagnosis present

## 2016-08-15 LAB — CBC
HEMATOCRIT: 31.5 % — AB (ref 39.0–52.0)
HEMOGLOBIN: 9 g/dL — AB (ref 13.0–17.0)
MCH: 20.1 pg — ABNORMAL LOW (ref 26.0–34.0)
MCHC: 28.6 g/dL — AB (ref 30.0–36.0)
MCV: 70.5 fL — AB (ref 78.0–100.0)
Platelets: 187 10*3/uL (ref 150–400)
RBC: 4.47 MIL/uL (ref 4.22–5.81)
RDW: 19.1 % — ABNORMAL HIGH (ref 11.5–15.5)
WBC: 4.2 10*3/uL (ref 4.0–10.5)

## 2016-08-15 LAB — BRAIN NATRIURETIC PEPTIDE: B NATRIURETIC PEPTIDE 5: 94.8 pg/mL (ref 0.0–100.0)

## 2016-08-15 LAB — BASIC METABOLIC PANEL
Anion gap: 8 (ref 5–15)
BUN: 9 mg/dL (ref 6–20)
CHLORIDE: 109 mmol/L (ref 101–111)
CO2: 23 mmol/L (ref 22–32)
CREATININE: 1.23 mg/dL (ref 0.61–1.24)
Calcium: 9.2 mg/dL (ref 8.9–10.3)
GFR calc Af Amer: >60 mL/min (ref >60)
GFR calc non Af Amer: 57 mL/min — ABNORMAL LOW (ref >60)
GLUCOSE: 114 mg/dL — AB (ref 65–99)
POTASSIUM: 3.6 mmol/L (ref 3.5–5.1)
SODIUM: 140 mmol/L (ref 135–145)

## 2016-08-15 LAB — TSH: TSH: 1.287 u[IU]/mL (ref 0.350–4.500)

## 2016-08-17 ENCOUNTER — Encounter (HOSPITAL_COMMUNITY): Payer: Self-pay

## 2016-08-20 ENCOUNTER — Encounter (HOSPITAL_COMMUNITY)
Admission: RE | Admit: 2016-08-20 | Discharge: 2016-08-20 | Disposition: A | Discharge status: Home / Self Care | Payer: Self-pay | Source: Ambulatory Visit | Attending: Internal Medicine | Admitting: Internal Medicine

## 2016-08-22 ENCOUNTER — Encounter (HOSPITAL_COMMUNITY): Payer: Self-pay

## 2016-08-24 ENCOUNTER — Encounter (HOSPITAL_COMMUNITY)
Admission: RE | Admit: 2016-08-24 | Discharge: 2016-08-24 | Disposition: A | Discharge status: Home / Self Care | Payer: Self-pay | Source: Ambulatory Visit | Attending: Internal Medicine | Admitting: Internal Medicine

## 2016-08-27 ENCOUNTER — Encounter (HOSPITAL_COMMUNITY)
Admission: RE | Admit: 2016-08-27 | Discharge: 2016-08-27 | Disposition: A | Discharge status: Home / Self Care | Payer: Self-pay | Source: Ambulatory Visit | Attending: Internal Medicine | Admitting: Internal Medicine

## 2016-08-29 ENCOUNTER — Encounter (HOSPITAL_COMMUNITY)
Admission: RE | Admit: 2016-08-29 | Discharge: 2016-08-29 | Disposition: A | Discharge status: Home / Self Care | Payer: Self-pay | Source: Ambulatory Visit | Attending: Internal Medicine | Admitting: Internal Medicine

## 2016-08-31 ENCOUNTER — Encounter (HOSPITAL_COMMUNITY): Payer: Self-pay

## 2016-09-03 ENCOUNTER — Encounter (HOSPITAL_COMMUNITY): Payer: Self-pay

## 2016-09-03 DIAGNOSIS — I5022 Chronic systolic (congestive) heart failure: Secondary | ICD-10-CM | POA: Insufficient documentation

## 2016-09-05 ENCOUNTER — Other Ambulatory Visit: Payer: Self-pay | Admitting: Internal Medicine

## 2016-09-05 ENCOUNTER — Encounter (HOSPITAL_COMMUNITY)
Admission: RE | Admit: 2016-09-05 | Discharge: 2016-09-05 | Disposition: A | Discharge status: Home / Self Care | Payer: Self-pay | Source: Ambulatory Visit | Attending: Internal Medicine | Admitting: Internal Medicine

## 2016-09-07 ENCOUNTER — Encounter (HOSPITAL_COMMUNITY)
Admission: RE | Admit: 2016-09-07 | Discharge: 2016-09-07 | Disposition: A | Discharge status: Home / Self Care | Payer: Self-pay | Source: Ambulatory Visit | Attending: Internal Medicine | Admitting: Internal Medicine

## 2016-09-10 ENCOUNTER — Encounter (HOSPITAL_COMMUNITY)
Admission: RE | Admit: 2016-09-10 | Discharge: 2016-09-10 | Disposition: A | Discharge status: Home / Self Care | Payer: Self-pay | Source: Ambulatory Visit | Attending: Internal Medicine | Admitting: Internal Medicine

## 2016-09-12 ENCOUNTER — Encounter (HOSPITAL_COMMUNITY): Payer: Self-pay

## 2016-09-14 ENCOUNTER — Encounter (HOSPITAL_COMMUNITY): Payer: Self-pay

## 2016-09-17 ENCOUNTER — Encounter (HOSPITAL_COMMUNITY)
Admission: RE | Admit: 2016-09-17 | Discharge: 2016-09-17 | Disposition: A | Discharge status: Home / Self Care | Payer: Self-pay | Source: Ambulatory Visit | Attending: Internal Medicine | Admitting: Internal Medicine

## 2016-09-18 ENCOUNTER — Other Ambulatory Visit (HOSPITAL_COMMUNITY): Payer: Self-pay | Admitting: Internal Medicine

## 2016-09-19 ENCOUNTER — Encounter (HOSPITAL_COMMUNITY): Payer: Self-pay

## 2016-09-21 ENCOUNTER — Encounter (HOSPITAL_COMMUNITY)
Admission: RE | Admit: 2016-09-21 | Discharge: 2016-09-21 | Disposition: A | Discharge status: Home / Self Care | Payer: Self-pay | Source: Ambulatory Visit | Attending: Internal Medicine | Admitting: Internal Medicine

## 2016-09-24 ENCOUNTER — Ambulatory Visit (HOSPITAL_COMMUNITY)
Admission: RE | Admit: 2016-09-24 | Discharge: 2016-09-24 | Disposition: A | Discharge status: Home / Self Care | Payer: Medicare HMO | Source: Ambulatory Visit | Attending: Family Medicine | Admitting: Family Medicine

## 2016-09-24 ENCOUNTER — Encounter (HOSPITAL_COMMUNITY): Payer: Self-pay

## 2016-09-24 ENCOUNTER — Ambulatory Visit (HOSPITAL_BASED_OUTPATIENT_CLINIC_OR_DEPARTMENT_OTHER)
Admission: RE | Admit: 2016-09-24 | Discharge: 2016-09-24 | Disposition: A | Discharge status: Home / Self Care | Payer: Medicare HMO | Source: Ambulatory Visit | Attending: Internal Medicine | Admitting: Internal Medicine

## 2016-09-24 VITALS — BP 106/54 | HR 68 | Wt 150.8 lb

## 2016-09-24 DIAGNOSIS — I4891 Unspecified atrial fibrillation: Secondary | ICD-10-CM | POA: Insufficient documentation

## 2016-09-24 DIAGNOSIS — I5042 Chronic combined systolic (congestive) and diastolic (congestive) heart failure: Secondary | ICD-10-CM | POA: Diagnosis not present

## 2016-09-24 DIAGNOSIS — I5189 Other ill-defined heart diseases: Secondary | ICD-10-CM | POA: Diagnosis not present

## 2016-09-24 LAB — BASIC METABOLIC PANEL
Anion gap: 5 (ref 5–15)
BUN: 12 mg/dL (ref 6–20)
CHLORIDE: 108 mmol/L (ref 101–111)
CO2: 25 mmol/L (ref 22–32)
CREATININE: 0.97 mg/dL (ref 0.61–1.24)
Calcium: 8.9 mg/dL (ref 8.9–10.3)
Glucose, Bld: 93 mg/dL (ref 65–99)
Potassium: 4.2 mmol/L (ref 3.5–5.1)
Sodium: 138 mmol/L (ref 135–145)

## 2016-09-24 LAB — BRAIN NATRIURETIC PEPTIDE: B Natriuretic Peptide: 126.7 pg/mL — ABNORMAL HIGH (ref 0.0–100.0)

## 2016-09-24 LAB — DIGOXIN LEVEL: Digoxin Level: 0.4 ng/mL — ABNORMAL LOW (ref 0.8–2.0)

## 2016-09-24 MED ORDER — SILDENAFIL CITRATE 50 MG PO TABS: 100.0000 mg | ORAL_TABLET | Freq: Every day | ORAL | 3 refills | Status: DC | PRN
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
## 2016-09-24 NOTE — Progress Notes (Signed)
Medication Samples have been provided to the patient.  Drug name: Sherryll Burger      Strength: 24/26 mg        Qty: 28  LOT: [id number]  Exp.Date: 11/18  Dosing instructions: Take 1 Tablet by mouth Two times daily  The patient has been instructed regarding the correct time, dose, and frequency of taking this medication, including desired effects and most common side effects.   Marianne Sofia S 12:18 PM 09/24/2016

## 2016-09-24 NOTE — Patient Instructions (Signed)
Labs today (will call for abnormal results, otherwise no news is good news)  Follow up in 4 months, we will call you to schedule follow up appointment.

## 2016-09-26 ENCOUNTER — Encounter (HOSPITAL_COMMUNITY): Payer: Self-pay

## 2016-09-26 ENCOUNTER — Telehealth (HOSPITAL_COMMUNITY): Payer: Self-pay | Admitting: Pharmacist

## 2016-09-26 MED ORDER — SACUBITRIL-VALSARTAN 24-26 MG PO TABS: 1.0000 | ORAL_TABLET | Freq: Two times a day (BID) | ORAL | 5 refills | Status: DC

## 2016-09-26 NOTE — Telephone Encounter (Signed)
Mr. Arrona stated that he is having difficulty paying for his Sherryll Burger so I have enrolled him in PAN foundation so that he will have $800 toward his copay costs through 09/25/17. Info relayed to [facility] on [address].   ID: [id number] BIN: [id number] PCN: [id number] GRP: [id number]  Avedis Bevis K. Bonnye Fava, PharmD, BCPS, CPP Clinical Pharmacist Pager: [phone] Phone: [phone] 09/26/2016 9:55 AM

## 2016-09-28 ENCOUNTER — Encounter (HOSPITAL_COMMUNITY): Payer: Self-pay

## 2016-10-01 ENCOUNTER — Encounter (HOSPITAL_COMMUNITY)
Admission: RE | Admit: 2016-10-01 | Discharge: 2016-10-01 | Disposition: A | Discharge status: Home / Self Care | Payer: Self-pay | Source: Ambulatory Visit | Attending: Internal Medicine | Admitting: Internal Medicine

## 2016-10-03 ENCOUNTER — Encounter (HOSPITAL_COMMUNITY)
Admission: RE | Admit: 2016-10-03 | Discharge: 2016-10-03 | Disposition: A | Discharge status: Home / Self Care | Payer: Self-pay | Source: Ambulatory Visit | Attending: Internal Medicine | Admitting: Internal Medicine

## 2016-10-03 DIAGNOSIS — I5022 Chronic systolic (congestive) heart failure: Secondary | ICD-10-CM | POA: Insufficient documentation

## 2016-10-05 ENCOUNTER — Encounter (HOSPITAL_COMMUNITY): Payer: Self-pay | Admitting: *Deleted

## 2016-10-05 ENCOUNTER — Encounter (HOSPITAL_COMMUNITY): Payer: Self-pay

## 2016-10-05 NOTE — Progress Notes (Signed)
Received medical record request on Oct 04, 2016 from [facility] asking for patient's last office notes including labs and medications.   All requested records faxed to [phone] today.  Original request will be scanned to patient's electronic medical record.

## 2016-10-08 ENCOUNTER — Encounter (HOSPITAL_COMMUNITY)
Admission: RE | Admit: 2016-10-08 | Discharge: 2016-10-08 | Disposition: A | Discharge status: Home / Self Care | Payer: Self-pay | Source: Ambulatory Visit | Attending: Internal Medicine | Admitting: Internal Medicine

## 2016-10-10 ENCOUNTER — Encounter (HOSPITAL_COMMUNITY): Payer: Self-pay

## 2016-10-12 ENCOUNTER — Encounter (HOSPITAL_COMMUNITY): Payer: Self-pay

## 2016-10-15 ENCOUNTER — Encounter (HOSPITAL_COMMUNITY)
Admission: RE | Admit: 2016-10-15 | Discharge: 2016-10-15 | Disposition: A | Discharge status: Home / Self Care | Payer: Self-pay | Source: Ambulatory Visit | Attending: Internal Medicine | Admitting: Internal Medicine

## 2016-10-17 ENCOUNTER — Encounter (HOSPITAL_COMMUNITY): Payer: Self-pay

## 2016-10-19 ENCOUNTER — Encounter (HOSPITAL_COMMUNITY): Payer: Self-pay

## 2016-10-22 ENCOUNTER — Encounter (HOSPITAL_COMMUNITY): Admission: RE | Admit: 2016-10-22 | Payer: Self-pay | Source: Ambulatory Visit

## 2016-10-24 ENCOUNTER — Encounter (HOSPITAL_COMMUNITY): Payer: Self-pay

## 2016-10-26 ENCOUNTER — Encounter (HOSPITAL_COMMUNITY)
Admission: RE | Admit: 2016-10-26 | Discharge: 2016-10-26 | Disposition: A | Discharge status: Home / Self Care | Payer: Self-pay | Source: Ambulatory Visit | Attending: Internal Medicine | Admitting: Internal Medicine
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
## 2016-10-29 ENCOUNTER — Other Ambulatory Visit (HOSPITAL_COMMUNITY): Payer: Self-pay | Admitting: Internal Medicine

## 2016-10-31 ENCOUNTER — Encounter (HOSPITAL_COMMUNITY): Payer: Self-pay

## 2016-11-02 ENCOUNTER — Encounter (HOSPITAL_COMMUNITY): Payer: Self-pay

## 2016-11-02 DIAGNOSIS — I5022 Chronic systolic (congestive) heart failure: Secondary | ICD-10-CM | POA: Insufficient documentation

## 2016-11-05 ENCOUNTER — Encounter (HOSPITAL_COMMUNITY)
Admission: RE | Admit: 2016-11-05 | Discharge: 2016-11-05 | Disposition: A | Discharge status: Home / Self Care | Payer: Self-pay | Source: Ambulatory Visit | Attending: Internal Medicine | Admitting: Internal Medicine

## 2016-11-07 ENCOUNTER — Encounter (HOSPITAL_COMMUNITY)
Admission: RE | Admit: 2016-11-07 | Discharge: 2016-11-07 | Disposition: A | Discharge status: Home / Self Care | Payer: Self-pay | Source: Ambulatory Visit | Attending: Internal Medicine | Admitting: Internal Medicine

## 2016-11-09 ENCOUNTER — Encounter (HOSPITAL_COMMUNITY): Payer: Self-pay

## 2016-11-09 ENCOUNTER — Other Ambulatory Visit (HOSPITAL_COMMUNITY): Payer: Self-pay | Admitting: Internal Medicine
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
## 2016-11-12 ENCOUNTER — Encounter (HOSPITAL_COMMUNITY): Payer: Self-pay

## 2016-11-14 ENCOUNTER — Encounter (HOSPITAL_COMMUNITY)
Admission: RE | Admit: 2016-11-14 | Discharge: 2016-11-14 | Disposition: A | Discharge status: Home / Self Care | Payer: Self-pay | Source: Ambulatory Visit | Attending: Internal Medicine | Admitting: Internal Medicine

## 2016-11-16 ENCOUNTER — Encounter (HOSPITAL_COMMUNITY): Payer: Self-pay

## 2016-11-19 ENCOUNTER — Encounter (HOSPITAL_COMMUNITY): Payer: Self-pay

## 2016-11-21 ENCOUNTER — Encounter (HOSPITAL_COMMUNITY): Payer: Self-pay

## 2016-11-23 ENCOUNTER — Encounter (HOSPITAL_COMMUNITY): Admission: RE | Admit: 2016-11-23 | Payer: Self-pay | Source: Ambulatory Visit

## 2016-11-23 ENCOUNTER — Telehealth (HOSPITAL_COMMUNITY): Payer: Self-pay | Admitting: *Deleted

## 2016-11-23 ENCOUNTER — Other Ambulatory Visit (HOSPITAL_COMMUNITY): Payer: Self-pay | Admitting: *Deleted

## 2016-11-23 MED ORDER — DIGOXIN 125 MCG PO TABS: 62.5000 ug | ORAL_TABLET | Freq: Every day | ORAL | 3 refills | Status: DC

## 2016-11-26 ENCOUNTER — Encounter (HOSPITAL_COMMUNITY): Payer: Self-pay

## 2016-11-28 ENCOUNTER — Encounter (HOSPITAL_COMMUNITY): Payer: Self-pay

## 2016-11-30 ENCOUNTER — Encounter (HOSPITAL_COMMUNITY)
Admission: RE | Admit: 2016-11-30 | Discharge: 2016-11-30 | Disposition: A | Discharge status: Home / Self Care | Payer: Self-pay | Source: Ambulatory Visit | Attending: Internal Medicine | Admitting: Internal Medicine

## 2016-12-03 ENCOUNTER — Encounter (HOSPITAL_COMMUNITY): Payer: Self-pay

## 2016-12-03 DIAGNOSIS — I5043 Acute on chronic combined systolic (congestive) and diastolic (congestive) heart failure: Secondary | ICD-10-CM | POA: Insufficient documentation

## 2016-12-07 ENCOUNTER — Encounter (HOSPITAL_COMMUNITY): Payer: Self-pay

## 2016-12-10 ENCOUNTER — Encounter (HOSPITAL_COMMUNITY): Payer: Self-pay

## 2016-12-12 ENCOUNTER — Encounter (HOSPITAL_COMMUNITY)
Admission: RE | Admit: 2016-12-12 | Discharge: 2016-12-12 | Disposition: A | Discharge status: Home / Self Care | Payer: Self-pay | Source: Ambulatory Visit | Attending: Internal Medicine | Admitting: Internal Medicine

## 2016-12-14 ENCOUNTER — Encounter (HOSPITAL_COMMUNITY)
Admission: RE | Admit: 2016-12-14 | Discharge: 2016-12-14 | Disposition: A | Discharge status: Home / Self Care | Payer: Self-pay | Source: Ambulatory Visit | Attending: Internal Medicine | Admitting: Internal Medicine

## 2016-12-16 ENCOUNTER — Other Ambulatory Visit (HOSPITAL_COMMUNITY): Payer: Self-pay | Admitting: Student

## 2016-12-17 ENCOUNTER — Encounter (HOSPITAL_COMMUNITY)
Admission: RE | Admit: 2016-12-17 | Discharge: 2016-12-17 | Disposition: A | Discharge status: Home / Self Care | Payer: Self-pay | Source: Ambulatory Visit | Attending: Internal Medicine | Admitting: Internal Medicine

## 2016-12-19 ENCOUNTER — Encounter (HOSPITAL_COMMUNITY)
Admission: RE | Admit: 2016-12-19 | Discharge: 2016-12-19 | Disposition: A | Discharge status: Home / Self Care | Payer: Self-pay | Source: Ambulatory Visit | Attending: Internal Medicine | Admitting: Internal Medicine

## 2016-12-21 ENCOUNTER — Encounter (HOSPITAL_COMMUNITY): Payer: Self-pay

## 2016-12-24 ENCOUNTER — Encounter (HOSPITAL_COMMUNITY): Payer: Self-pay

## 2016-12-26 ENCOUNTER — Encounter (HOSPITAL_COMMUNITY): Payer: Self-pay

## 2016-12-28 ENCOUNTER — Encounter (HOSPITAL_COMMUNITY): Payer: Self-pay

## 2016-12-31 ENCOUNTER — Encounter (HOSPITAL_COMMUNITY)
Admission: RE | Admit: 2016-12-31 | Discharge: 2016-12-31 | Disposition: A | Discharge status: Home / Self Care | Payer: Self-pay | Source: Ambulatory Visit | Attending: Internal Medicine | Admitting: Internal Medicine

## 2017-01-01 ENCOUNTER — Telehealth (HOSPITAL_COMMUNITY): Payer: Self-pay | Admitting: Cardiology

## 2017-01-01 NOTE — Telephone Encounter (Signed)
PATIENTS WIFE CALLED TO REQUEST AN APPT WITH DR BENSIMHON WIFE REPORTS THAT PATIENT IS HAVING CHEST DISCOMFORT AT TIMES, INCREASED FATIGUE AND DIZZINESS.  REPORTS THESE ARE THE SAME SYMPTOMS HE EXPERIENCED BEFORE HIS LAST HOSPITALIZATION. WEIGHT TODAY 151   PATIENT DOES NOT WANT TO REPORT TO THE ER AS HE STATES EVERYTHING IS JUST FINE, ADVISED WIFE IF CHEST PRESSURE AND ABOVE SYMPTOMS WORSEN, HE SHOULD REPORT TO THE ER FOR FURTHER EVALUATION, WILL WORK ON ADD ON APPT.

## 2017-01-02 ENCOUNTER — Encounter (HOSPITAL_COMMUNITY): Payer: Medicare HMO

## 2017-01-02 DIAGNOSIS — I5043 Acute on chronic combined systolic (congestive) and diastolic (congestive) heart failure: Secondary | ICD-10-CM | POA: Insufficient documentation

## 2017-01-04 ENCOUNTER — Encounter (HOSPITAL_COMMUNITY): Payer: Medicare HMO

## 2017-01-07 ENCOUNTER — Encounter (HOSPITAL_COMMUNITY): Payer: Medicare HMO

## 2017-01-07 ENCOUNTER — Telehealth (HOSPITAL_COMMUNITY): Payer: Self-pay | Admitting: *Deleted

## 2017-01-07 NOTE — Telephone Encounter (Signed)
Patient's wife called and left message on triage line asking if the patient can be seen.  Did not give anymore information.  I called her back but had to leave VM asking for her to call us back.

## 2017-01-09 ENCOUNTER — Encounter (HOSPITAL_COMMUNITY): Payer: Medicare HMO

## 2017-01-11 ENCOUNTER — Encounter (HOSPITAL_COMMUNITY): Payer: Medicare HMO

## 2017-01-13 ENCOUNTER — Other Ambulatory Visit (HOSPITAL_COMMUNITY): Payer: Self-pay | Admitting: Internal Medicine

## 2017-01-14 ENCOUNTER — Encounter (HOSPITAL_COMMUNITY)
Admission: RE | Admit: 2017-01-14 | Discharge: 2017-01-14 | Disposition: A | Discharge status: Home / Self Care | Payer: Medicare HMO | Source: Ambulatory Visit | Attending: Internal Medicine | Admitting: Internal Medicine

## 2017-01-16 ENCOUNTER — Encounter (HOSPITAL_COMMUNITY)
Admission: RE | Admit: 2017-01-16 | Discharge: 2017-01-16 | Disposition: A | Discharge status: Home / Self Care | Payer: Medicare HMO | Source: Ambulatory Visit | Attending: Internal Medicine | Admitting: Internal Medicine

## 2017-01-18 ENCOUNTER — Encounter (HOSPITAL_COMMUNITY): Payer: Medicare HMO

## 2017-01-18 ENCOUNTER — Encounter (HOSPITAL_COMMUNITY): Payer: Self-pay | Admitting: *Deleted

## 2017-01-18 NOTE — Progress Notes (Signed)
Received medical record request on January 18, 2017 from [facility] asking for patient's last office notes including labs and medications.   All requested records faxed to [phone] today.  Original request will be scanned to patient's electronic medical record.

## 2017-01-21 ENCOUNTER — Encounter (HOSPITAL_COMMUNITY): Payer: Medicare HMO

## 2017-01-23 ENCOUNTER — Encounter (HOSPITAL_COMMUNITY): Payer: Medicare HMO

## 2017-01-25 ENCOUNTER — Encounter (HOSPITAL_COMMUNITY): Payer: Medicare HMO

## 2017-01-27 ENCOUNTER — Other Ambulatory Visit (HOSPITAL_COMMUNITY): Payer: Self-pay | Admitting: Internal Medicine

## 2017-01-28 ENCOUNTER — Encounter (HOSPITAL_COMMUNITY): Payer: Medicare HMO

## 2017-01-30 ENCOUNTER — Encounter (HOSPITAL_COMMUNITY)
Admission: RE | Admit: 2017-01-30 | Discharge: 2017-01-30 | Disposition: A | Discharge status: Home / Self Care | Payer: Medicare HMO | Source: Ambulatory Visit | Attending: Internal Medicine | Admitting: Internal Medicine

## 2017-02-01 ENCOUNTER — Encounter (HOSPITAL_COMMUNITY): Payer: Medicare HMO

## 2017-02-06 ENCOUNTER — Encounter (HOSPITAL_COMMUNITY): Payer: Medicare HMO

## 2017-02-06 DIAGNOSIS — I5043 Acute on chronic combined systolic (congestive) and diastolic (congestive) heart failure: Secondary | ICD-10-CM | POA: Insufficient documentation

## 2017-02-08 ENCOUNTER — Encounter (HOSPITAL_COMMUNITY): Payer: Medicare HMO

## 2017-02-11 ENCOUNTER — Encounter (HOSPITAL_COMMUNITY): Payer: Medicare HMO

## 2017-02-13 ENCOUNTER — Encounter (HOSPITAL_COMMUNITY): Payer: Medicare HMO

## 2017-02-15 ENCOUNTER — Encounter (HOSPITAL_COMMUNITY): Payer: Medicare HMO

## 2017-02-18 ENCOUNTER — Encounter (HOSPITAL_COMMUNITY): Payer: Medicare HMO

## 2017-02-20 ENCOUNTER — Encounter (HOSPITAL_COMMUNITY)
Admission: RE | Admit: 2017-02-20 | Discharge: 2017-02-20 | Disposition: A | Discharge status: Home / Self Care | Payer: Medicare HMO | Source: Ambulatory Visit | Attending: Internal Medicine | Admitting: Internal Medicine

## 2017-02-22 ENCOUNTER — Encounter (HOSPITAL_COMMUNITY)
Admission: RE | Admit: 2017-02-22 | Discharge: 2017-02-22 | Disposition: A | Discharge status: Home / Self Care | Payer: Medicare HMO | Source: Ambulatory Visit | Attending: Internal Medicine | Admitting: Internal Medicine

## 2017-02-25 ENCOUNTER — Encounter (HOSPITAL_COMMUNITY): Payer: Medicare HMO

## 2017-02-27 ENCOUNTER — Encounter (HOSPITAL_COMMUNITY): Payer: Medicare HMO

## 2017-03-01 ENCOUNTER — Encounter (HOSPITAL_COMMUNITY): Payer: Medicare HMO

## 2017-03-04 ENCOUNTER — Encounter (HOSPITAL_COMMUNITY): Payer: Self-pay

## 2017-03-04 DIAGNOSIS — I5043 Acute on chronic combined systolic (congestive) and diastolic (congestive) heart failure: Secondary | ICD-10-CM | POA: Insufficient documentation

## 2017-03-06 ENCOUNTER — Encounter (HOSPITAL_COMMUNITY): Payer: Self-pay

## 2017-03-08 ENCOUNTER — Encounter (HOSPITAL_COMMUNITY): Payer: Self-pay

## 2017-03-11 ENCOUNTER — Other Ambulatory Visit (HOSPITAL_COMMUNITY): Payer: Self-pay | Admitting: Cardiology

## 2017-03-11 ENCOUNTER — Encounter (HOSPITAL_COMMUNITY): Payer: Self-pay

## 2017-03-13 ENCOUNTER — Encounter (HOSPITAL_COMMUNITY): Payer: Self-pay | Admitting: Internal Medicine

## 2017-03-13 ENCOUNTER — Ambulatory Visit (HOSPITAL_COMMUNITY)
Admission: RE | Admit: 2017-03-13 | Discharge: 2017-03-13 | Disposition: A | Discharge status: Home / Self Care | Payer: Medicare HMO | Source: Ambulatory Visit | Attending: Internal Medicine | Admitting: Internal Medicine

## 2017-03-13 ENCOUNTER — Encounter (HOSPITAL_COMMUNITY): Payer: Self-pay

## 2017-03-13 VITALS — BP 112/58 | HR 69 | Wt 145.4 lb

## 2017-03-13 DIAGNOSIS — I493 Ventricular premature depolarization: Secondary | ICD-10-CM | POA: Diagnosis not present

## 2017-03-13 DIAGNOSIS — I480 Paroxysmal atrial fibrillation: Secondary | ICD-10-CM

## 2017-03-13 DIAGNOSIS — Z79899 Other long term (current) drug therapy: Secondary | ICD-10-CM | POA: Insufficient documentation

## 2017-03-13 DIAGNOSIS — I110 Hypertensive heart disease with heart failure: Secondary | ICD-10-CM | POA: Diagnosis present

## 2017-03-13 DIAGNOSIS — I5042 Chronic combined systolic (congestive) and diastolic (congestive) heart failure: Secondary | ICD-10-CM

## 2017-03-13 DIAGNOSIS — B1920 Unspecified viral hepatitis C without hepatic coma: Secondary | ICD-10-CM | POA: Diagnosis not present

## 2017-03-13 DIAGNOSIS — Z87891 Personal history of nicotine dependence: Secondary | ICD-10-CM | POA: Diagnosis not present

## 2017-03-13 LAB — COMPREHENSIVE METABOLIC PANEL
ALT: 15 U/L — AB (ref 17–63)
ANION GAP: 10 (ref 5–15)
AST: 29 U/L (ref 15–41)
Albumin: 3.7 g/dL (ref 3.5–5.0)
Alkaline Phosphatase: 95 U/L (ref 38–126)
BUN: 14 mg/dL (ref 6–20)
CHLORIDE: 105 mmol/L (ref 101–111)
CO2: 22 mmol/L (ref 22–32)
CREATININE: 1.02 mg/dL (ref 0.61–1.24)
Calcium: 9 mg/dL (ref 8.9–10.3)
GFR calc non Af Amer: >60 mL/min (ref >60)
Glucose, Bld: 98 mg/dL (ref 65–99)
POTASSIUM: 4.2 mmol/L (ref 3.5–5.1)
SODIUM: 137 mmol/L (ref 135–145)
Total Bilirubin: 1 mg/dL (ref 0.3–1.2)
Total Protein: 7 g/dL (ref 6.5–8.1)

## 2017-03-13 LAB — CBC
HEMATOCRIT: 28.1 % — AB (ref 39.0–52.0)
HEMOGLOBIN: 8 g/dL — AB (ref 13.0–17.0)
MCH: 19.8 pg — ABNORMAL LOW (ref 26.0–34.0)
MCHC: 28.5 g/dL — AB (ref 30.0–36.0)
MCV: 69.6 fL — AB (ref 78.0–100.0)
Platelets: 167 10*3/uL (ref 150–400)
RBC: 4.04 MIL/uL — AB (ref 4.22–5.81)
RDW: 19.8 % — AB (ref 11.5–15.5)
WBC: 3.3 10*3/uL — ABNORMAL LOW (ref 4.0–10.5)

## 2017-03-13 LAB — DIGOXIN LEVEL: Digoxin Level: 0.8 ng/mL (ref 0.8–2.0)

## 2017-03-13 NOTE — Patient Instructions (Addendum)
Will schedule you for a holter monitor (heart monitor) to be placed at [facility] office. Address: [address] ([address]), [place], [place] [address]  Phone: [phone] Their office will call you to schedule.  Routine lab work today. Will notify you of abnormal results, otherwise no news is good news!  EKG today.  Follow up 3 months with Dr. Gala Romney. We will call you closer to this time, or you may call our office to schedule 1 month before you are due to be seen. Take all medication as prescribed the day of your appointment. Bring all medications with you to your appointment.  Do the following things EVERYDAY: 1) Weigh yourself in the morning before breakfast. Write it down and keep it in a log. 2) Take your medicines as prescribed 3) Eat low salt foods-Limit salt (sodium) to 2000 mg per day.  4) Stay as active as you can everyday 5) Limit all fluids for the day to less than 2 liters
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
## 2017-03-15 ENCOUNTER — Encounter (HOSPITAL_COMMUNITY): Payer: Self-pay

## 2017-03-18 ENCOUNTER — Encounter (HOSPITAL_COMMUNITY)
Admission: RE | Admit: 2017-03-18 | Discharge: 2017-03-18 | Disposition: A | Discharge status: Home / Self Care | Payer: Self-pay | Source: Ambulatory Visit | Attending: Internal Medicine | Admitting: Internal Medicine

## 2017-03-20 ENCOUNTER — Encounter (HOSPITAL_COMMUNITY)
Admission: RE | Admit: 2017-03-20 | Discharge: 2017-03-20 | Disposition: A | Discharge status: Home / Self Care | Payer: Self-pay | Source: Ambulatory Visit | Attending: Internal Medicine | Admitting: Internal Medicine

## 2017-03-22 ENCOUNTER — Ambulatory Visit (INDEPENDENT_AMBULATORY_CARE_PROVIDER_SITE_OTHER): Payer: Medicare HMO

## 2017-03-22 ENCOUNTER — Other Ambulatory Visit (HOSPITAL_COMMUNITY): Payer: Self-pay | Admitting: Internal Medicine

## 2017-03-22 ENCOUNTER — Encounter (HOSPITAL_COMMUNITY): Payer: Self-pay

## 2017-03-22 DIAGNOSIS — I493 Ventricular premature depolarization: Secondary | ICD-10-CM | POA: Diagnosis not present

## 2017-03-22 DIAGNOSIS — I5042 Chronic combined systolic (congestive) and diastolic (congestive) heart failure: Secondary | ICD-10-CM | POA: Diagnosis not present

## 2017-03-25 ENCOUNTER — Encounter (HOSPITAL_COMMUNITY): Payer: Self-pay

## 2017-03-27 ENCOUNTER — Encounter (HOSPITAL_COMMUNITY): Payer: Self-pay

## 2017-03-28 ENCOUNTER — Other Ambulatory Visit (HOSPITAL_COMMUNITY): Payer: Self-pay | Admitting: Internal Medicine

## 2017-03-29 ENCOUNTER — Other Ambulatory Visit (HOSPITAL_COMMUNITY): Payer: Self-pay | Admitting: Cardiology

## 2017-03-29 ENCOUNTER — Encounter (HOSPITAL_COMMUNITY): Payer: Self-pay

## 2017-04-01 ENCOUNTER — Encounter (HOSPITAL_COMMUNITY): Payer: Self-pay

## 2017-04-03 ENCOUNTER — Encounter (HOSPITAL_COMMUNITY): Payer: Self-pay

## 2017-04-05 ENCOUNTER — Encounter (HOSPITAL_COMMUNITY)
Admission: RE | Admit: 2017-04-05 | Discharge: 2017-04-05 | Disposition: A | Discharge status: Home / Self Care | Payer: Medicare HMO | Source: Ambulatory Visit | Attending: Internal Medicine | Admitting: Internal Medicine

## 2017-04-05 DIAGNOSIS — I5043 Acute on chronic combined systolic (congestive) and diastolic (congestive) heart failure: Secondary | ICD-10-CM | POA: Insufficient documentation

## 2017-04-08 ENCOUNTER — Encounter (HOSPITAL_COMMUNITY): Payer: Self-pay

## 2017-04-10 ENCOUNTER — Encounter (HOSPITAL_COMMUNITY)
Admission: RE | Admit: 2017-04-10 | Discharge: 2017-04-10 | Disposition: A | Discharge status: Home / Self Care | Payer: Self-pay | Source: Ambulatory Visit | Attending: Internal Medicine | Admitting: Internal Medicine

## 2017-04-12 ENCOUNTER — Encounter (HOSPITAL_COMMUNITY): Payer: Self-pay

## 2017-04-15 ENCOUNTER — Encounter (HOSPITAL_COMMUNITY): Payer: Self-pay

## 2017-04-17 ENCOUNTER — Encounter (HOSPITAL_COMMUNITY): Payer: Self-pay

## 2017-04-19 ENCOUNTER — Encounter (HOSPITAL_COMMUNITY): Payer: Self-pay

## 2017-04-20 ENCOUNTER — Other Ambulatory Visit (HOSPITAL_COMMUNITY): Payer: Self-pay | Admitting: Internal Medicine

## 2017-04-22 ENCOUNTER — Encounter (HOSPITAL_COMMUNITY)
Admission: RE | Admit: 2017-04-22 | Discharge: 2017-04-22 | Disposition: A | Discharge status: Home / Self Care | Payer: Self-pay | Source: Ambulatory Visit | Attending: Internal Medicine | Admitting: Internal Medicine

## 2017-04-24 ENCOUNTER — Encounter (HOSPITAL_COMMUNITY): Payer: Self-pay

## 2017-04-29 ENCOUNTER — Encounter (HOSPITAL_COMMUNITY): Payer: Self-pay

## 2017-05-01 ENCOUNTER — Encounter (HOSPITAL_COMMUNITY): Payer: Self-pay

## 2017-05-03 ENCOUNTER — Encounter (HOSPITAL_COMMUNITY): Payer: Self-pay

## 2017-05-06 ENCOUNTER — Encounter (HOSPITAL_COMMUNITY)
Admission: RE | Admit: 2017-05-06 | Discharge: 2017-05-06 | Disposition: A | Discharge status: Home / Self Care | Payer: Medicare HMO | Source: Ambulatory Visit | Attending: Internal Medicine | Admitting: Internal Medicine

## 2017-05-06 DIAGNOSIS — I5043 Acute on chronic combined systolic (congestive) and diastolic (congestive) heart failure: Secondary | ICD-10-CM | POA: Insufficient documentation

## 2017-05-08 ENCOUNTER — Encounter (HOSPITAL_COMMUNITY)
Admission: RE | Admit: 2017-05-08 | Discharge: 2017-05-08 | Disposition: A | Discharge status: Home / Self Care | Payer: Medicare HMO | Source: Ambulatory Visit | Attending: Internal Medicine | Admitting: Internal Medicine

## 2017-05-10 ENCOUNTER — Encounter (HOSPITAL_COMMUNITY)
Admission: RE | Admit: 2017-05-10 | Discharge: 2017-05-10 | Disposition: A | Discharge status: Home / Self Care | Payer: Medicare HMO | Source: Ambulatory Visit | Attending: Internal Medicine | Admitting: Internal Medicine

## 2017-05-13 ENCOUNTER — Encounter (HOSPITAL_COMMUNITY): Payer: Medicare HMO

## 2017-05-14 ENCOUNTER — Other Ambulatory Visit (HOSPITAL_COMMUNITY): Payer: Self-pay | Admitting: *Deleted

## 2017-05-14 MED ORDER — HYDRALAZINE HCL 25 MG PO TABS: 25.0000 mg | ORAL_TABLET | Freq: Three times a day (TID) | ORAL | 3 refills | Status: DC

## 2017-05-15 ENCOUNTER — Encounter (HOSPITAL_COMMUNITY): Payer: Medicare HMO

## 2017-05-17 ENCOUNTER — Encounter (HOSPITAL_COMMUNITY): Payer: Medicare HMO

## 2017-05-20 ENCOUNTER — Encounter (HOSPITAL_COMMUNITY)
Admission: RE | Admit: 2017-05-20 | Discharge: 2017-05-20 | Disposition: A | Discharge status: Home / Self Care | Payer: Medicare HMO | Source: Ambulatory Visit | Attending: Internal Medicine | Admitting: Internal Medicine

## 2017-05-22 ENCOUNTER — Encounter (HOSPITAL_COMMUNITY): Payer: Medicare HMO

## 2017-05-24 ENCOUNTER — Encounter (HOSPITAL_COMMUNITY): Payer: Medicare HMO

## 2017-05-29 ENCOUNTER — Encounter (HOSPITAL_COMMUNITY): Payer: Medicare HMO

## 2017-05-31 ENCOUNTER — Encounter (HOSPITAL_COMMUNITY): Payer: Medicare HMO

## 2017-06-03 ENCOUNTER — Encounter (HOSPITAL_COMMUNITY): Payer: Medicare HMO

## 2017-06-05 ENCOUNTER — Encounter (HOSPITAL_COMMUNITY): Payer: Self-pay | Attending: Internal Medicine

## 2017-06-05 DIAGNOSIS — I5043 Acute on chronic combined systolic (congestive) and diastolic (congestive) heart failure: Secondary | ICD-10-CM | POA: Insufficient documentation

## 2017-06-07 ENCOUNTER — Encounter (HOSPITAL_COMMUNITY): Payer: Self-pay

## 2017-06-10 ENCOUNTER — Encounter (HOSPITAL_COMMUNITY): Payer: Self-pay

## 2017-06-12 ENCOUNTER — Encounter (HOSPITAL_COMMUNITY): Payer: Self-pay

## 2017-06-14 ENCOUNTER — Encounter (HOSPITAL_COMMUNITY): Payer: Self-pay

## 2017-06-17 ENCOUNTER — Encounter (HOSPITAL_COMMUNITY): Payer: Self-pay

## 2017-06-19 ENCOUNTER — Encounter (HOSPITAL_COMMUNITY): Payer: Self-pay

## 2017-06-21 ENCOUNTER — Encounter (HOSPITAL_COMMUNITY): Payer: Self-pay

## 2017-06-24 ENCOUNTER — Encounter (HOSPITAL_COMMUNITY): Payer: Self-pay

## 2017-06-24 ENCOUNTER — Other Ambulatory Visit (HOSPITAL_COMMUNITY): Payer: Self-pay | Admitting: Internal Medicine

## 2017-06-26 ENCOUNTER — Encounter (HOSPITAL_COMMUNITY): Payer: Self-pay

## 2017-06-28 ENCOUNTER — Encounter (HOSPITAL_COMMUNITY): Payer: Self-pay

## 2017-07-01 ENCOUNTER — Encounter (HOSPITAL_COMMUNITY): Payer: Self-pay

## 2017-07-03 ENCOUNTER — Encounter (HOSPITAL_COMMUNITY): Payer: Self-pay

## 2017-07-08 ENCOUNTER — Encounter (HOSPITAL_COMMUNITY)
Admission: RE | Admit: 2017-07-08 | Discharge: 2017-07-08 | Disposition: A | Discharge status: Home / Self Care | Payer: Medicare HMO | Source: Ambulatory Visit | Attending: Internal Medicine | Admitting: Internal Medicine

## 2017-07-08 DIAGNOSIS — I5043 Acute on chronic combined systolic (congestive) and diastolic (congestive) heart failure: Secondary | ICD-10-CM | POA: Diagnosis present

## 2017-07-10 ENCOUNTER — Encounter (HOSPITAL_COMMUNITY)
Admission: RE | Admit: 2017-07-10 | Discharge: 2017-07-10 | Disposition: A | Discharge status: Home / Self Care | Payer: Medicare HMO | Source: Ambulatory Visit | Attending: Internal Medicine | Admitting: Internal Medicine

## 2017-07-12 ENCOUNTER — Other Ambulatory Visit (HOSPITAL_COMMUNITY): Payer: Self-pay | Admitting: *Deleted

## 2017-07-12 MED ORDER — CARVEDILOL 3.125 MG PO TABS: 3.1250 mg | ORAL_TABLET | Freq: Two times a day (BID) | ORAL | 3 refills | Status: DC

## 2017-07-17 ENCOUNTER — Other Ambulatory Visit (HOSPITAL_COMMUNITY): Payer: Self-pay | Admitting: Internal Medicine

## 2017-07-19 ENCOUNTER — Encounter (HOSPITAL_COMMUNITY)
Admission: RE | Admit: 2017-07-19 | Discharge: 2017-07-19 | Disposition: A | Discharge status: Home / Self Care | Payer: Medicare HMO | Source: Ambulatory Visit | Attending: Internal Medicine | Admitting: Internal Medicine

## 2017-08-02 ENCOUNTER — Encounter (HOSPITAL_COMMUNITY): Payer: Medicare HMO

## 2017-08-02 DIAGNOSIS — I5022 Chronic systolic (congestive) heart failure: Secondary | ICD-10-CM | POA: Diagnosis present

## 2017-08-05 ENCOUNTER — Encounter (HOSPITAL_COMMUNITY)
Admission: RE | Admit: 2017-08-05 | Discharge: 2017-08-05 | Disposition: A | Discharge status: Home / Self Care | Payer: Medicare HMO | Source: Ambulatory Visit | Attending: Internal Medicine | Admitting: Internal Medicine

## 2017-08-07 ENCOUNTER — Encounter (HOSPITAL_COMMUNITY): Payer: Medicare HMO

## 2017-08-09 ENCOUNTER — Encounter (HOSPITAL_COMMUNITY): Payer: Medicare HMO

## 2017-08-12 ENCOUNTER — Encounter (HOSPITAL_COMMUNITY): Payer: Medicare HMO

## 2017-08-14 ENCOUNTER — Encounter (HOSPITAL_COMMUNITY): Payer: Medicare HMO

## 2017-08-16 ENCOUNTER — Encounter (HOSPITAL_COMMUNITY): Payer: Medicare HMO

## 2017-08-19 ENCOUNTER — Encounter (HOSPITAL_COMMUNITY): Payer: Medicare HMO

## 2017-08-21 ENCOUNTER — Encounter (HOSPITAL_COMMUNITY): Payer: Medicare HMO

## 2017-08-23 ENCOUNTER — Encounter (HOSPITAL_COMMUNITY): Payer: Medicare HMO

## 2017-08-26 ENCOUNTER — Encounter (HOSPITAL_COMMUNITY): Payer: Medicare HMO

## 2017-08-28 ENCOUNTER — Encounter (HOSPITAL_COMMUNITY): Payer: Medicare HMO

## 2017-08-30 ENCOUNTER — Encounter (HOSPITAL_COMMUNITY): Payer: Medicare HMO

## 2017-09-02 ENCOUNTER — Encounter (HOSPITAL_COMMUNITY): Payer: Self-pay

## 2017-09-02 ENCOUNTER — Encounter: Payer: Self-pay | Admitting: Neurology

## 2017-09-02 ENCOUNTER — Encounter (HOSPITAL_COMMUNITY): Payer: Self-pay | Admitting: Internal Medicine

## 2017-09-02 ENCOUNTER — Ambulatory Visit (HOSPITAL_COMMUNITY)
Admission: RE | Admit: 2017-09-02 | Discharge: 2017-09-02 | Disposition: A | Discharge status: Home / Self Care | Payer: Medicare HMO | Source: Ambulatory Visit | Attending: Internal Medicine | Admitting: Internal Medicine

## 2017-09-02 VITALS — BP 104/68 | HR 75 | Wt 151.8 lb

## 2017-09-02 DIAGNOSIS — Z79899 Other long term (current) drug therapy: Secondary | ICD-10-CM | POA: Insufficient documentation

## 2017-09-02 DIAGNOSIS — I5022 Chronic systolic (congestive) heart failure: Secondary | ICD-10-CM

## 2017-09-02 DIAGNOSIS — Z87891 Personal history of nicotine dependence: Secondary | ICD-10-CM | POA: Insufficient documentation

## 2017-09-02 DIAGNOSIS — I493 Ventricular premature depolarization: Secondary | ICD-10-CM

## 2017-09-02 DIAGNOSIS — I110 Hypertensive heart disease with heart failure: Secondary | ICD-10-CM | POA: Insufficient documentation

## 2017-09-02 DIAGNOSIS — I480 Paroxysmal atrial fibrillation: Secondary | ICD-10-CM | POA: Insufficient documentation

## 2017-09-02 DIAGNOSIS — I428 Other cardiomyopathies: Secondary | ICD-10-CM | POA: Insufficient documentation

## 2017-09-02 DIAGNOSIS — Z7901 Long term (current) use of anticoagulants: Secondary | ICD-10-CM | POA: Diagnosis not present

## 2017-09-02 DIAGNOSIS — G20 Parkinson's disease: Secondary | ICD-10-CM

## 2017-09-02 DIAGNOSIS — I5042 Chronic combined systolic (congestive) and diastolic (congestive) heart failure: Secondary | ICD-10-CM | POA: Diagnosis not present

## 2017-09-02 DIAGNOSIS — B1920 Unspecified viral hepatitis C without hepatic coma: Secondary | ICD-10-CM | POA: Diagnosis not present

## 2017-09-02 LAB — BASIC METABOLIC PANEL
Anion gap: 7 (ref 5–15)
BUN: 11 mg/dL (ref 6–20)
CHLORIDE: 107 mmol/L (ref 101–111)
CO2: 24 mmol/L (ref 22–32)
CREATININE: 0.83 mg/dL (ref 0.61–1.24)
Calcium: 8.8 mg/dL — ABNORMAL LOW (ref 8.9–10.3)
GFR calc non Af Amer: >60 mL/min (ref >60)
Glucose, Bld: 98 mg/dL (ref 65–99)
POTASSIUM: 4 mmol/L (ref 3.5–5.1)
SODIUM: 138 mmol/L (ref 135–145)

## 2017-09-02 LAB — DIGOXIN LEVEL: DIGOXIN LVL: 0.6 ng/mL — AB (ref 0.8–2.0)

## 2017-09-02 NOTE — Patient Instructions (Addendum)
Labs today  PYP Scan, this is done in Radiology at [facility], the scan takes about 3 hours  You have been referred to Neurology, they will call you for an appointment  We will contact you in 6 months to schedule your next appointment.

## 2017-09-02 NOTE — Addendum Note (Signed)
Encounter addended by: Noralee Space, RN on: 09/02/2017 11:12 AM  Actions taken: Visit diagnoses modified, Order list changed, Diagnosis association updated, Sign clinical note
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
## 2017-09-04 ENCOUNTER — Encounter (HOSPITAL_COMMUNITY)
Admission: RE | Admit: 2017-09-04 | Discharge: 2017-09-04 | Disposition: A | Discharge status: Home / Self Care | Payer: Medicare HMO | Source: Ambulatory Visit | Attending: Internal Medicine | Admitting: Internal Medicine

## 2017-09-05 ENCOUNTER — Encounter (HOSPITAL_COMMUNITY)
Admission: RE | Admit: 2017-09-05 | Discharge: 2017-09-05 | Disposition: A | Discharge status: Home / Self Care | Payer: Medicare HMO | Source: Ambulatory Visit | Attending: Internal Medicine | Admitting: Internal Medicine

## 2017-09-05 DIAGNOSIS — I5022 Chronic systolic (congestive) heart failure: Secondary | ICD-10-CM | POA: Insufficient documentation

## 2017-09-05 MED ORDER — TECHNETIUM TC 99M PYROPHOSPHATE
20.0000 | Freq: Once | INTRAVENOUS | Status: AC
  Administered 2017-09-05: 20 via INTRAVENOUS
  Filled 2017-09-05: qty 20

## 2017-09-06 ENCOUNTER — Encounter (HOSPITAL_COMMUNITY): Payer: Self-pay

## 2017-09-09 ENCOUNTER — Encounter (HOSPITAL_COMMUNITY): Payer: Self-pay
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
## 2017-09-11 ENCOUNTER — Encounter (HOSPITAL_COMMUNITY): Payer: Self-pay

## 2017-09-13 ENCOUNTER — Encounter (HOSPITAL_COMMUNITY): Payer: Self-pay

## 2017-09-16 ENCOUNTER — Encounter (HOSPITAL_COMMUNITY): Payer: Medicare HMO
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
## 2017-09-18 ENCOUNTER — Encounter (HOSPITAL_COMMUNITY)
Admission: RE | Admit: 2017-09-18 | Discharge: 2017-09-18 | Disposition: A | Discharge status: Home / Self Care | Payer: Self-pay | Source: Ambulatory Visit | Attending: Internal Medicine | Admitting: Internal Medicine

## 2017-09-18 ENCOUNTER — Ambulatory Visit: Payer: Medicare HMO | Admitting: Neurology

## 2017-09-19 ENCOUNTER — Other Ambulatory Visit (HOSPITAL_COMMUNITY): Payer: Self-pay | Admitting: Internal Medicine

## 2017-09-20 ENCOUNTER — Encounter (HOSPITAL_COMMUNITY): Payer: Self-pay

## 2017-09-23 ENCOUNTER — Encounter (HOSPITAL_COMMUNITY)
Admission: RE | Admit: 2017-09-23 | Discharge: 2017-09-23 | Disposition: A | Discharge status: Home / Self Care | Payer: Medicare HMO | Source: Ambulatory Visit | Attending: Internal Medicine | Admitting: Internal Medicine

## 2017-09-25 ENCOUNTER — Encounter (HOSPITAL_COMMUNITY)
Admission: RE | Admit: 2017-09-25 | Discharge: 2017-09-25 | Disposition: A | Discharge status: Home / Self Care | Payer: Medicare HMO | Source: Ambulatory Visit | Attending: Internal Medicine | Admitting: Internal Medicine

## 2017-09-26 ENCOUNTER — Other Ambulatory Visit (HOSPITAL_COMMUNITY): Payer: Self-pay | Admitting: Internal Medicine

## 2017-09-27 ENCOUNTER — Encounter (HOSPITAL_COMMUNITY): Payer: Self-pay

## 2017-09-30 ENCOUNTER — Encounter (HOSPITAL_COMMUNITY): Payer: Self-pay

## 2017-10-02 ENCOUNTER — Encounter (HOSPITAL_COMMUNITY): Payer: Self-pay

## 2017-10-02 DIAGNOSIS — I5022 Chronic systolic (congestive) heart failure: Secondary | ICD-10-CM | POA: Insufficient documentation

## 2017-10-04 ENCOUNTER — Encounter (HOSPITAL_COMMUNITY): Payer: Self-pay

## 2017-10-07 ENCOUNTER — Encounter (HOSPITAL_COMMUNITY): Payer: Self-pay

## 2017-10-09 ENCOUNTER — Encounter (HOSPITAL_COMMUNITY): Payer: Self-pay

## 2017-10-11 ENCOUNTER — Encounter (HOSPITAL_COMMUNITY): Payer: Self-pay

## 2017-10-14 ENCOUNTER — Encounter (HOSPITAL_COMMUNITY): Payer: Self-pay

## 2017-10-16 ENCOUNTER — Encounter (HOSPITAL_COMMUNITY): Payer: Self-pay

## 2017-10-18 ENCOUNTER — Encounter (HOSPITAL_COMMUNITY): Payer: Self-pay

## 2017-10-21 ENCOUNTER — Encounter (HOSPITAL_COMMUNITY): Payer: Self-pay

## 2017-10-21 ENCOUNTER — Other Ambulatory Visit (HOSPITAL_COMMUNITY): Payer: Self-pay | Admitting: Internal Medicine

## 2017-10-23 ENCOUNTER — Encounter (HOSPITAL_COMMUNITY): Payer: Self-pay

## 2017-10-23 NOTE — Addendum Note (Signed)
Encounter addended by: Jacques Earthly, RD on: 10/23/2017 10:01 AM  Actions taken: Flowsheet accepted

## 2017-10-24 ENCOUNTER — Other Ambulatory Visit (HOSPITAL_COMMUNITY): Payer: Self-pay | Admitting: *Deleted

## 2017-10-24 MED ORDER — EPLERENONE 25 MG PO TABS: 25.0000 mg | ORAL_TABLET | Freq: Every day | ORAL | 6 refills | Status: DC

## 2017-10-25 ENCOUNTER — Encounter (HOSPITAL_COMMUNITY)
Admission: RE | Admit: 2017-10-25 | Discharge: 2017-10-25 | Disposition: A | Discharge status: Home / Self Care | Payer: Self-pay | Source: Ambulatory Visit | Attending: Internal Medicine | Admitting: Internal Medicine

## 2017-10-25 ENCOUNTER — Telehealth (HOSPITAL_COMMUNITY): Payer: Self-pay | Admitting: Cardiology

## 2017-10-25 NOTE — Telephone Encounter (Signed)
[employer] APPLICATION APPROVED FOR PATIENT ASSISTANCE- VALID 10/22/2017-06/03/2018

## 2017-10-30 ENCOUNTER — Encounter (HOSPITAL_COMMUNITY): Payer: Self-pay

## 2017-11-01 ENCOUNTER — Encounter (HOSPITAL_COMMUNITY): Payer: Self-pay

## 2017-11-04 ENCOUNTER — Encounter (HOSPITAL_COMMUNITY)
Admission: RE | Admit: 2017-11-04 | Discharge: 2017-11-04 | Disposition: A | Discharge status: Home / Self Care | Payer: Self-pay | Source: Ambulatory Visit | Attending: Internal Medicine | Admitting: Internal Medicine

## 2017-11-04 DIAGNOSIS — I5022 Chronic systolic (congestive) heart failure: Secondary | ICD-10-CM | POA: Insufficient documentation

## 2017-11-06 ENCOUNTER — Encounter (HOSPITAL_COMMUNITY): Payer: Self-pay

## 2017-11-07 ENCOUNTER — Encounter (HOSPITAL_COMMUNITY): Payer: Self-pay

## 2017-11-07 NOTE — Progress Notes (Signed)
PANF approved through remainder of the year.  Ave Filter, RN

## 2017-11-08 ENCOUNTER — Encounter (HOSPITAL_COMMUNITY): Payer: Self-pay

## 2017-11-11 ENCOUNTER — Encounter (HOSPITAL_COMMUNITY): Payer: Self-pay

## 2017-11-13 ENCOUNTER — Encounter (HOSPITAL_COMMUNITY): Payer: Self-pay

## 2017-11-15 ENCOUNTER — Encounter (HOSPITAL_COMMUNITY): Payer: Self-pay

## 2017-11-18 ENCOUNTER — Other Ambulatory Visit (HOSPITAL_COMMUNITY): Payer: Self-pay | Admitting: Internal Medicine

## 2017-11-18 ENCOUNTER — Encounter (HOSPITAL_COMMUNITY)
Admission: RE | Admit: 2017-11-18 | Discharge: 2017-11-18 | Disposition: A | Discharge status: Home / Self Care | Payer: Self-pay | Source: Ambulatory Visit | Attending: Internal Medicine | Admitting: Internal Medicine

## 2017-11-20 ENCOUNTER — Encounter (HOSPITAL_COMMUNITY)
Admission: RE | Admit: 2017-11-20 | Discharge: 2017-11-20 | Disposition: A | Discharge status: Home / Self Care | Payer: Self-pay | Source: Ambulatory Visit | Attending: Internal Medicine | Admitting: Internal Medicine

## 2017-11-22 ENCOUNTER — Encounter (HOSPITAL_COMMUNITY)
Admission: RE | Admit: 2017-11-22 | Discharge: 2017-11-22 | Disposition: A | Discharge status: Home / Self Care | Payer: Self-pay | Source: Ambulatory Visit | Attending: Internal Medicine | Admitting: Internal Medicine

## 2017-11-25 ENCOUNTER — Encounter (HOSPITAL_COMMUNITY)
Admission: RE | Admit: 2017-11-25 | Discharge: 2017-11-25 | Disposition: A | Discharge status: Home / Self Care | Payer: Medicare HMO | Source: Ambulatory Visit | Attending: Internal Medicine | Admitting: Internal Medicine

## 2017-11-27 ENCOUNTER — Encounter (HOSPITAL_COMMUNITY): Payer: Self-pay

## 2017-11-29 ENCOUNTER — Encounter (HOSPITAL_COMMUNITY): Payer: Self-pay

## 2017-12-02 ENCOUNTER — Encounter (HOSPITAL_COMMUNITY)
Admission: RE | Admit: 2017-12-02 | Discharge: 2017-12-02 | Disposition: A | Discharge status: Home / Self Care | Payer: Self-pay | Source: Ambulatory Visit | Attending: Internal Medicine | Admitting: Internal Medicine

## 2017-12-02 DIAGNOSIS — I5022 Chronic systolic (congestive) heart failure: Secondary | ICD-10-CM | POA: Insufficient documentation

## 2017-12-04 ENCOUNTER — Encounter (HOSPITAL_COMMUNITY): Payer: Self-pay

## 2017-12-06 ENCOUNTER — Encounter (HOSPITAL_COMMUNITY): Payer: Self-pay

## 2017-12-09 ENCOUNTER — Encounter (HOSPITAL_COMMUNITY): Payer: Self-pay

## 2017-12-11 ENCOUNTER — Encounter (HOSPITAL_COMMUNITY): Payer: Self-pay

## 2017-12-13 ENCOUNTER — Encounter (HOSPITAL_COMMUNITY): Payer: Self-pay

## 2017-12-16 ENCOUNTER — Encounter (HOSPITAL_COMMUNITY): Payer: Self-pay

## 2017-12-18 ENCOUNTER — Encounter (HOSPITAL_COMMUNITY): Payer: Self-pay

## 2017-12-20 ENCOUNTER — Encounter (HOSPITAL_COMMUNITY): Payer: Self-pay

## 2017-12-23 ENCOUNTER — Encounter (HOSPITAL_COMMUNITY): Payer: Self-pay

## 2017-12-25 ENCOUNTER — Encounter (HOSPITAL_COMMUNITY): Payer: Self-pay

## 2017-12-27 ENCOUNTER — Encounter (HOSPITAL_COMMUNITY): Payer: Self-pay

## 2017-12-30 ENCOUNTER — Encounter (HOSPITAL_COMMUNITY): Payer: Self-pay

## 2018-01-01 ENCOUNTER — Encounter (HOSPITAL_COMMUNITY): Payer: Self-pay

## 2018-01-03 ENCOUNTER — Encounter (HOSPITAL_COMMUNITY): Payer: Self-pay

## 2018-01-03 DIAGNOSIS — I5022 Chronic systolic (congestive) heart failure: Secondary | ICD-10-CM | POA: Insufficient documentation

## 2018-01-06 ENCOUNTER — Encounter (HOSPITAL_COMMUNITY): Payer: Self-pay

## 2018-01-08 ENCOUNTER — Encounter (HOSPITAL_COMMUNITY): Payer: Self-pay

## 2018-01-10 ENCOUNTER — Encounter (HOSPITAL_COMMUNITY): Payer: Self-pay

## 2018-01-13 ENCOUNTER — Encounter (HOSPITAL_COMMUNITY): Payer: Self-pay

## 2018-01-15 ENCOUNTER — Encounter (HOSPITAL_COMMUNITY): Payer: Self-pay

## 2018-01-17 ENCOUNTER — Encounter (HOSPITAL_COMMUNITY): Payer: Self-pay

## 2018-01-20 ENCOUNTER — Encounter (HOSPITAL_COMMUNITY)
Admission: RE | Admit: 2018-01-20 | Discharge: 2018-01-20 | Disposition: A | Discharge status: Home / Self Care | Payer: Self-pay | Source: Ambulatory Visit | Attending: Internal Medicine | Admitting: Internal Medicine

## 2018-01-22 ENCOUNTER — Encounter (HOSPITAL_COMMUNITY)
Admission: RE | Admit: 2018-01-22 | Discharge: 2018-01-22 | Disposition: A | Discharge status: Home / Self Care | Payer: Self-pay | Source: Ambulatory Visit | Attending: Internal Medicine | Admitting: Internal Medicine

## 2018-01-24 ENCOUNTER — Encounter (HOSPITAL_COMMUNITY): Payer: Medicare HMO

## 2018-01-27 ENCOUNTER — Encounter (HOSPITAL_COMMUNITY): Payer: Self-pay

## 2018-01-29 ENCOUNTER — Encounter (HOSPITAL_COMMUNITY): Payer: Self-pay

## 2018-01-31 ENCOUNTER — Encounter (HOSPITAL_COMMUNITY): Payer: Self-pay

## 2018-02-10 ENCOUNTER — Encounter (HOSPITAL_COMMUNITY): Payer: Self-pay

## 2018-02-10 DIAGNOSIS — I5042 Chronic combined systolic (congestive) and diastolic (congestive) heart failure: Secondary | ICD-10-CM | POA: Insufficient documentation

## 2018-02-12 ENCOUNTER — Encounter (HOSPITAL_COMMUNITY)
Admission: RE | Admit: 2018-02-12 | Discharge: 2018-02-12 | Disposition: A | Discharge status: Home / Self Care | Payer: Self-pay | Source: Ambulatory Visit | Attending: Internal Medicine | Admitting: Internal Medicine

## 2018-02-14 ENCOUNTER — Encounter (HOSPITAL_COMMUNITY)
Admission: RE | Admit: 2018-02-14 | Discharge: 2018-02-14 | Disposition: A | Discharge status: Home / Self Care | Payer: Self-pay | Source: Ambulatory Visit | Attending: Internal Medicine | Admitting: Internal Medicine

## 2018-02-17 ENCOUNTER — Encounter (HOSPITAL_COMMUNITY)
Admission: RE | Admit: 2018-02-17 | Discharge: 2018-02-17 | Disposition: A | Discharge status: Home / Self Care | Payer: Self-pay | Source: Ambulatory Visit | Attending: Internal Medicine | Admitting: Internal Medicine

## 2018-02-19 ENCOUNTER — Encounter (HOSPITAL_COMMUNITY)
Admission: RE | Admit: 2018-02-19 | Discharge: 2018-02-19 | Disposition: A | Discharge status: Home / Self Care | Payer: Self-pay | Source: Ambulatory Visit | Attending: Internal Medicine | Admitting: Internal Medicine

## 2018-02-21 ENCOUNTER — Encounter (HOSPITAL_COMMUNITY): Payer: Self-pay

## 2018-02-24 ENCOUNTER — Encounter (HOSPITAL_COMMUNITY)
Admission: RE | Admit: 2018-02-24 | Discharge: 2018-02-24 | Disposition: A | Discharge status: Home / Self Care | Payer: Self-pay | Source: Ambulatory Visit | Attending: Internal Medicine | Admitting: Internal Medicine

## 2018-02-26 ENCOUNTER — Encounter (HOSPITAL_COMMUNITY)
Admission: RE | Admit: 2018-02-26 | Discharge: 2018-02-26 | Disposition: A | Discharge status: Home / Self Care | Payer: Self-pay | Source: Ambulatory Visit | Attending: Internal Medicine | Admitting: Internal Medicine

## 2018-02-28 ENCOUNTER — Encounter (HOSPITAL_COMMUNITY)
Admission: RE | Admit: 2018-02-28 | Discharge: 2018-02-28 | Disposition: A | Discharge status: Home / Self Care | Payer: Self-pay | Source: Ambulatory Visit | Attending: Internal Medicine | Admitting: Internal Medicine

## 2018-03-03 ENCOUNTER — Encounter (HOSPITAL_COMMUNITY): Payer: Self-pay

## 2018-03-05 ENCOUNTER — Encounter (HOSPITAL_COMMUNITY): Payer: Self-pay

## 2018-03-05 DIAGNOSIS — I5042 Chronic combined systolic (congestive) and diastolic (congestive) heart failure: Secondary | ICD-10-CM | POA: Insufficient documentation

## 2018-03-07 ENCOUNTER — Encounter (HOSPITAL_COMMUNITY): Payer: Self-pay

## 2018-03-10 ENCOUNTER — Encounter (HOSPITAL_COMMUNITY): Payer: Self-pay

## 2018-03-12 ENCOUNTER — Encounter (HOSPITAL_COMMUNITY): Payer: Self-pay

## 2018-03-14 ENCOUNTER — Encounter (HOSPITAL_COMMUNITY): Payer: Self-pay

## 2018-03-17 ENCOUNTER — Encounter (HOSPITAL_COMMUNITY): Payer: Self-pay

## 2018-03-19 ENCOUNTER — Encounter (HOSPITAL_COMMUNITY): Payer: Self-pay

## 2018-03-21 ENCOUNTER — Encounter (HOSPITAL_COMMUNITY): Payer: Self-pay

## 2018-03-24 ENCOUNTER — Encounter (HOSPITAL_COMMUNITY)
Admission: RE | Admit: 2018-03-24 | Discharge: 2018-03-24 | Disposition: A | Discharge status: Home / Self Care | Payer: Self-pay | Source: Ambulatory Visit | Attending: Internal Medicine | Admitting: Internal Medicine

## 2018-03-26 ENCOUNTER — Encounter (HOSPITAL_COMMUNITY)
Admission: RE | Admit: 2018-03-26 | Discharge: 2018-03-26 | Disposition: A | Discharge status: Home / Self Care | Payer: Self-pay | Source: Ambulatory Visit | Attending: Internal Medicine | Admitting: Internal Medicine

## 2018-03-28 ENCOUNTER — Encounter (HOSPITAL_COMMUNITY): Payer: Self-pay

## 2018-03-31 ENCOUNTER — Encounter (HOSPITAL_COMMUNITY)
Admission: RE | Admit: 2018-03-31 | Discharge: 2018-03-31 | Disposition: A | Discharge status: Home / Self Care | Payer: Self-pay | Source: Ambulatory Visit | Attending: Internal Medicine | Admitting: Internal Medicine

## 2018-04-01 ENCOUNTER — Encounter (HOSPITAL_COMMUNITY): Payer: Self-pay

## 2018-04-01 ENCOUNTER — Telehealth (HOSPITAL_COMMUNITY): Payer: Self-pay

## 2018-04-01 ENCOUNTER — Ambulatory Visit (HOSPITAL_COMMUNITY)
Admission: RE | Admit: 2018-04-01 | Discharge: 2018-04-01 | Disposition: A | Discharge status: Home / Self Care | Payer: Medicare HMO | Source: Ambulatory Visit | Attending: Cardiology | Admitting: Cardiology

## 2018-04-01 VITALS — BP 118/68 | HR 70 | Wt 142.6 lb

## 2018-04-01 DIAGNOSIS — B1920 Unspecified viral hepatitis C without hepatic coma: Secondary | ICD-10-CM | POA: Insufficient documentation

## 2018-04-01 DIAGNOSIS — Z79899 Other long term (current) drug therapy: Secondary | ICD-10-CM | POA: Diagnosis not present

## 2018-04-01 DIAGNOSIS — I493 Ventricular premature depolarization: Secondary | ICD-10-CM | POA: Diagnosis not present

## 2018-04-01 DIAGNOSIS — Z7901 Long term (current) use of anticoagulants: Secondary | ICD-10-CM | POA: Diagnosis not present

## 2018-04-01 DIAGNOSIS — I5042 Chronic combined systolic (congestive) and diastolic (congestive) heart failure: Secondary | ICD-10-CM

## 2018-04-01 DIAGNOSIS — Z7951 Long term (current) use of inhaled steroids: Secondary | ICD-10-CM | POA: Diagnosis not present

## 2018-04-01 DIAGNOSIS — I5022 Chronic systolic (congestive) heart failure: Secondary | ICD-10-CM

## 2018-04-01 DIAGNOSIS — I447 Left bundle-branch block, unspecified: Secondary | ICD-10-CM | POA: Insufficient documentation

## 2018-04-01 DIAGNOSIS — R5383 Other fatigue: Secondary | ICD-10-CM

## 2018-04-01 DIAGNOSIS — Z87891 Personal history of nicotine dependence: Secondary | ICD-10-CM | POA: Insufficient documentation

## 2018-04-01 DIAGNOSIS — M1990 Unspecified osteoarthritis, unspecified site: Secondary | ICD-10-CM | POA: Diagnosis not present

## 2018-04-01 DIAGNOSIS — I480 Paroxysmal atrial fibrillation: Secondary | ICD-10-CM | POA: Diagnosis not present

## 2018-04-01 DIAGNOSIS — I428 Other cardiomyopathies: Secondary | ICD-10-CM | POA: Insufficient documentation

## 2018-04-01 DIAGNOSIS — I110 Hypertensive heart disease with heart failure: Secondary | ICD-10-CM | POA: Diagnosis not present

## 2018-04-01 DIAGNOSIS — Z87442 Personal history of urinary calculi: Secondary | ICD-10-CM | POA: Diagnosis not present

## 2018-04-01 DIAGNOSIS — I951 Orthostatic hypotension: Secondary | ICD-10-CM | POA: Insufficient documentation

## 2018-04-01 DIAGNOSIS — Z87828 Personal history of other (healed) physical injury and trauma: Secondary | ICD-10-CM | POA: Diagnosis not present

## 2018-04-01 LAB — CBC
HCT: 29.9 % — ABNORMAL LOW (ref 39.0–52.0)
Hemoglobin: 8 g/dL — ABNORMAL LOW (ref 13.0–17.0)
MCH: 20 pg — ABNORMAL LOW (ref 26.0–34.0)
MCHC: 26.8 g/dL — AB (ref 30.0–36.0)
MCV: 74.6 fL — ABNORMAL LOW (ref 80.0–100.0)
NRBC: 0 % (ref 0.0–0.2)
PLATELETS: 180 10*3/uL (ref 150–400)
RBC: 4.01 MIL/uL — ABNORMAL LOW (ref 4.22–5.81)
RDW: 18.6 % — AB (ref 11.5–15.5)
WBC: 3.8 10*3/uL — AB (ref 4.0–10.5)

## 2018-04-01 LAB — BASIC METABOLIC PANEL
ANION GAP: 4 — AB (ref 5–15)
BUN: 11 mg/dL (ref 8–23)
CALCIUM: 9.1 mg/dL (ref 8.9–10.3)
CO2: 25 mmol/L (ref 22–32)
CREATININE: 0.94 mg/dL (ref 0.61–1.24)
Chloride: 110 mmol/L (ref 98–111)
Glucose, Bld: 112 mg/dL — ABNORMAL HIGH (ref 70–99)
Potassium: 3.8 mmol/L (ref 3.5–5.1)
Sodium: 139 mmol/L (ref 135–145)

## 2018-04-01 LAB — DIGOXIN LEVEL: Digoxin Level: 0.4 ng/mL — ABNORMAL LOW (ref 0.8–2.0)

## 2018-04-01 LAB — MAGNESIUM: Magnesium: 2.1 mg/dL (ref 1.7–2.4)

## 2018-04-01 LAB — TSH: TSH: 1.184 u[IU]/mL (ref 0.350–4.500)

## 2018-04-01 NOTE — Patient Instructions (Signed)
Today you have been seen at the Heart failure clinic at [facility]   You had Lab work done today:  You had an EKG today  We will call you if your lab work is abnormal.. No news is good news!!   Follow up with Dr, Gala Romney in 6 weeks  You have the following test scheduled for: ECHO and Stress test  Your physician has requested that you have an echocardiogram. Echocardiography is a painless test that uses sound waves to create images of your heart. It provides your doctor with information about the size and shape of your heart and how well your heart's chambers and valves are working. This procedure takes approximately one hour. There are no restrictions for this procedure.  Your physician has recommended that you have a cardiopulmonary stress test (CPX). CPX testing is a non-invasive measurement of heart and lung function. It replaces a traditional treadmill stress test. This type of test provides a tremendous amount of information that relates not only to your present condition but also for future outcomes. This test combines measurements of you ventilation, respiratory gas exchange in the lungs, electrocardiogram (EKG), blood pressure and physical response before, during, and following an exercise protocol.   Do the following things EVERYDAY: 1) Weigh yourself in the morning before breakfast. Write it down and keep it in a log. 2) Take your medicines as prescribed 3) Eat low salt foods-Limit salt (sodium) to 2000 mg per day.  4) Stay as active as you can everyday 5) Limit all fluids for the day to less than 2 liters

## 2018-04-01 NOTE — Telephone Encounter (Signed)
Pt called with lab results Dig level, BMET, Mg, and TSH WNL.  Hgb chronically low at 8.0. Does he have PCP? If does, should follow up with them. May need iron. If he does not, please check Iron stores/Anemia panel, as may need iron transfusion.  This was the only lab that could potentially be contributing to his fatigue.  Pt has a PCP appointment on Monday,

## 2018-04-01 NOTE — Addendum Note (Signed)
Encounter addended by: Burna Sis, LCSW on: 04/01/2018 10:42 AM  Actions taken: Visit Navigator Flowsheet section accepted
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
## 2018-04-02 ENCOUNTER — Encounter (HOSPITAL_COMMUNITY): Payer: Self-pay

## 2018-04-04 ENCOUNTER — Other Ambulatory Visit: Payer: Self-pay

## 2018-04-04 ENCOUNTER — Encounter (HOSPITAL_COMMUNITY): Payer: Medicare HMO

## 2018-04-04 ENCOUNTER — Telehealth (HOSPITAL_COMMUNITY): Payer: Self-pay | Admitting: *Deleted

## 2018-04-04 ENCOUNTER — Encounter (HOSPITAL_COMMUNITY): Payer: Self-pay | Admitting: Emergency Medicine

## 2018-04-04 ENCOUNTER — Observation Stay (HOSPITAL_COMMUNITY)
Admission: EM | Admit: 2018-04-04 | Discharge: 2018-04-05 | Disposition: A | Discharge status: Home / Self Care | Payer: Medicare HMO | Attending: Internal Medicine | Admitting: Internal Medicine

## 2018-04-04 ENCOUNTER — Emergency Department (HOSPITAL_COMMUNITY): Payer: Medicare HMO

## 2018-04-04 DIAGNOSIS — D509 Iron deficiency anemia, unspecified: Secondary | ICD-10-CM | POA: Insufficient documentation

## 2018-04-04 DIAGNOSIS — Z7901 Long term (current) use of anticoagulants: Secondary | ICD-10-CM | POA: Insufficient documentation

## 2018-04-04 DIAGNOSIS — I5042 Chronic combined systolic (congestive) and diastolic (congestive) heart failure: Secondary | ICD-10-CM | POA: Insufficient documentation

## 2018-04-04 DIAGNOSIS — Z87891 Personal history of nicotine dependence: Secondary | ICD-10-CM | POA: Insufficient documentation

## 2018-04-04 DIAGNOSIS — D649 Anemia, unspecified: Secondary | ICD-10-CM

## 2018-04-04 DIAGNOSIS — I4891 Unspecified atrial fibrillation: Secondary | ICD-10-CM | POA: Diagnosis not present

## 2018-04-04 DIAGNOSIS — R5383 Other fatigue: Secondary | ICD-10-CM | POA: Diagnosis present

## 2018-04-04 DIAGNOSIS — Z87442 Personal history of urinary calculi: Secondary | ICD-10-CM | POA: Diagnosis not present

## 2018-04-04 DIAGNOSIS — I110 Hypertensive heart disease with heart failure: Principal | ICD-10-CM | POA: Insufficient documentation

## 2018-04-04 DIAGNOSIS — Z79899 Other long term (current) drug therapy: Secondary | ICD-10-CM | POA: Insufficient documentation

## 2018-04-04 DIAGNOSIS — I428 Other cardiomyopathies: Secondary | ICD-10-CM

## 2018-04-04 DIAGNOSIS — B182 Chronic viral hepatitis C: Secondary | ICD-10-CM | POA: Insufficient documentation

## 2018-04-04 DIAGNOSIS — R9389 Abnormal findings on diagnostic imaging of other specified body structures: Secondary | ICD-10-CM

## 2018-04-04 DIAGNOSIS — R0602 Shortness of breath: Secondary | ICD-10-CM | POA: Diagnosis present

## 2018-04-04 DIAGNOSIS — R42 Dizziness and giddiness: Secondary | ICD-10-CM

## 2018-04-04 LAB — CBC
HCT: 29.9 % — ABNORMAL LOW (ref 39.0–52.0)
Hemoglobin: 8.3 g/dL — ABNORMAL LOW (ref 13.0–17.0)
MCH: 20.7 pg — AB (ref 26.0–34.0)
MCHC: 27.8 g/dL — AB (ref 30.0–36.0)
MCV: 74.6 fL — ABNORMAL LOW (ref 80.0–100.0)
PLATELETS: 177 10*3/uL (ref 150–400)
RBC: 4.01 MIL/uL — AB (ref 4.22–5.81)
RDW: 18.6 % — ABNORMAL HIGH (ref 11.5–15.5)
WBC: 4.9 10*3/uL (ref 4.0–10.5)
nRBC: 0 % (ref 0.0–0.2)

## 2018-04-04 LAB — I-STAT TROPONIN, ED: Troponin i, poc: 0 ng/mL (ref 0.00–0.08)

## 2018-04-04 LAB — BASIC METABOLIC PANEL
Anion gap: 5 (ref 5–15)
BUN: 12 mg/dL (ref 8–23)
CO2: 26 mmol/L (ref 22–32)
CREATININE: 0.9 mg/dL (ref 0.61–1.24)
Calcium: 9.2 mg/dL (ref 8.9–10.3)
Chloride: 108 mmol/L (ref 98–111)
Glucose, Bld: 84 mg/dL (ref 70–99)
Potassium: 3.9 mmol/L (ref 3.5–5.1)
SODIUM: 139 mmol/L (ref 135–145)

## 2018-04-04 LAB — BRAIN NATRIURETIC PEPTIDE: B NATRIURETIC PEPTIDE 5: 100 pg/mL (ref 0.0–100.0)

## 2018-04-04 NOTE — ED Provider Notes (Signed)
Patient placed in Quick Look pathway, seen and evaluated   Chief Complaint: Lightheaded and sob  HPI:  Patient with a history of CHF seen by cardiologist Dr. Nicholes Mango presents with fatigue, lightheadedness and sob with exertion. He had lab work drawn on 03/31/2018 and was told that his blood count was 8.  He has had to have a blood transfusion about a year ago.  States that today he started feeling very tired, lightheaded.  Short of breath when he tries to exert himself.  He denies cough, leg swelling, fever, chest pain.    ROS: - cough and leg swelling  Physical Exam:   Gen: No distress  Neuro: Awake and Alert  Skin: Warm    Focused Exam: Lungs CTA   Initiation of care has begun. The patient has been counseled on the process, plan, and necessity for staying for the completion/evaluation, and the remainder of the medical screening examination    Dale [name] 04/04/18 2055    Arby Barrette, MD 04/10/18 1455

## 2018-04-04 NOTE — ED Triage Notes (Signed)
Pt with dizziness since this morning. He reports some shortness of breath for a week. He was seen at his cardiologists office and had blood work done, he was told his hgb was low. PA at the bedside. The patient is alert and oriented x4 with no acute distress at triage.

## 2018-04-04 NOTE — Telephone Encounter (Signed)
Patients wife called triage a few times c/o that patient was "very tired" and needed a blood transfusion. Patients hemoglobin chronically runs low we have scheduled a blood transfusion in the past for him for an acute situation. He has an appointment Monday with his PCP to manage this now chronic condition. I called patient back who states he feels "fine" has been a "lil tired" but nothing abnormal. I advised patient to keep his appointment with his PCP Monday and if he started to feel bad over the weekend he should go to the emergency room patient is agreeable with plan.

## 2018-04-04 NOTE — ED Notes (Signed)
Pt family member back up to nurses station, states pt is now having CP. Pt taken to triage, repeat EKG obtained. Pt will be taken back to lobby.
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
## 2018-04-04 NOTE — ED Notes (Signed)
Pt family to nurses station requesting update, informed her of pt status in line for a room. Pt family member states that she just wants the lab values, informed her I an unable to provide that information, an EDP must be the one to go over the results. Family member states that she just wants labs so they can leave, informed her that information can be requested from Medical Records during business hours. Pt family not happy with my answers.

## 2018-04-04 NOTE — ED Notes (Signed)
Paged [facility] to [phone]

## 2018-04-05 DIAGNOSIS — R5383 Other fatigue: Secondary | ICD-10-CM | POA: Diagnosis present

## 2018-04-05 LAB — IRON AND TIBC
Iron: 14 ug/dL — ABNORMAL LOW (ref 45–182)
SATURATION RATIOS: 3 % — AB (ref 17.9–39.5)
TIBC: 452 ug/dL — AB (ref 250–450)
UIBC: 438 ug/dL

## 2018-04-05 LAB — FOLATE: Folate: 40 ng/mL (ref >5.9)

## 2018-04-05 LAB — DIGOXIN LEVEL: DIGOXIN LVL: 0.4 ng/mL — AB (ref 0.8–2.0)

## 2018-04-05 LAB — VITAMIN B12: Vitamin B-12: 708 pg/mL (ref 180–914)

## 2018-04-05 LAB — RETICULOCYTES
Immature Retic Fract: 13.7 % (ref 2.3–15.9)
RBC.: 4.31 MIL/uL (ref 4.22–5.81)
RETIC CT PCT: 1.1 % (ref 0.4–3.1)
Retic Count, Absolute: 49.1 10*3/uL (ref 19.0–186.0)

## 2018-04-05 LAB — FERRITIN: FERRITIN: 5 ng/mL — AB (ref 24–336)

## 2018-04-05 MED ORDER — ONDANSETRON HCL 4 MG/2ML IJ SOLN: 4.0000 mg | Freq: Four times a day (QID) | INTRAMUSCULAR | Status: DC | PRN

## 2018-04-05 MED ORDER — ACETAMINOPHEN 325 MG PO TABS: 650.0000 mg | ORAL_TABLET | ORAL | Status: DC | PRN

## 2018-04-07 ENCOUNTER — Encounter (HOSPITAL_COMMUNITY): Payer: Medicare HMO

## 2018-04-07 DIAGNOSIS — I5042 Chronic combined systolic (congestive) and diastolic (congestive) heart failure: Secondary | ICD-10-CM | POA: Diagnosis not present

## 2018-04-09 ENCOUNTER — Encounter (HOSPITAL_COMMUNITY): Payer: Medicare HMO

## 2018-04-10 ENCOUNTER — Telehealth (HOSPITAL_COMMUNITY): Payer: Self-pay | Admitting: Surgery

## 2018-04-10 NOTE — Telephone Encounter (Signed)
Patient's wife called and insists that her husband is rapidly declining.  She says he has "given up on life" and is concerned he may "die this weekend".  She is adamant that he be worked in to see Dr. Gala Romney.  I have added him to tomorrow's schedule at 11:00AM to se Dr. Gala Romney.  She was very grateful and says she will have him here.

## 2018-04-11 ENCOUNTER — Encounter (HOSPITAL_COMMUNITY): Payer: Medicare HMO

## 2018-04-11 ENCOUNTER — Other Ambulatory Visit: Payer: Self-pay

## 2018-04-11 ENCOUNTER — Other Ambulatory Visit (HOSPITAL_COMMUNITY): Payer: Self-pay | Admitting: *Deleted

## 2018-04-11 ENCOUNTER — Ambulatory Visit (HOSPITAL_COMMUNITY)
Admission: RE | Admit: 2018-04-11 | Discharge: 2018-04-11 | Disposition: A | Discharge status: Home / Self Care | Payer: Medicare HMO | Source: Ambulatory Visit | Attending: Internal Medicine | Admitting: Internal Medicine

## 2018-04-11 VITALS — BP 106/62 | HR 88 | Wt 141.1 lb

## 2018-04-11 DIAGNOSIS — I5042 Chronic combined systolic (congestive) and diastolic (congestive) heart failure: Secondary | ICD-10-CM | POA: Diagnosis not present

## 2018-04-11 DIAGNOSIS — Z7901 Long term (current) use of anticoagulants: Secondary | ICD-10-CM | POA: Diagnosis not present

## 2018-04-11 DIAGNOSIS — Z87891 Personal history of nicotine dependence: Secondary | ICD-10-CM | POA: Diagnosis not present

## 2018-04-11 DIAGNOSIS — Z79899 Other long term (current) drug therapy: Secondary | ICD-10-CM | POA: Diagnosis not present

## 2018-04-11 DIAGNOSIS — I428 Other cardiomyopathies: Secondary | ICD-10-CM | POA: Diagnosis not present

## 2018-04-11 DIAGNOSIS — D509 Iron deficiency anemia, unspecified: Secondary | ICD-10-CM | POA: Diagnosis not present

## 2018-04-11 DIAGNOSIS — Z79891 Long term (current) use of opiate analgesic: Secondary | ICD-10-CM | POA: Diagnosis not present

## 2018-04-11 DIAGNOSIS — D500 Iron deficiency anemia secondary to blood loss (chronic): Secondary | ICD-10-CM

## 2018-04-11 DIAGNOSIS — I5022 Chronic systolic (congestive) heart failure: Secondary | ICD-10-CM | POA: Diagnosis not present

## 2018-04-11 DIAGNOSIS — B1920 Unspecified viral hepatitis C without hepatic coma: Secondary | ICD-10-CM | POA: Insufficient documentation

## 2018-04-11 DIAGNOSIS — I110 Hypertensive heart disease with heart failure: Secondary | ICD-10-CM | POA: Insufficient documentation

## 2018-04-11 NOTE — Addendum Note (Signed)
Encounter addended by: Dolores Patty, MD on: 04/11/2018 4:50 PM  Actions taken: Charge Capture section accepted, LOS modified

## 2018-04-11 NOTE — Patient Instructions (Signed)
Iron infusion on Monday 04/14/18 at 10 am  Keep follow up appointment as scheduled on 05/08/18
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
## 2018-04-14 ENCOUNTER — Ambulatory Visit (HOSPITAL_COMMUNITY)
Admission: RE | Admit: 2018-04-14 | Discharge: 2018-04-14 | Disposition: A | Discharge status: Home / Self Care | Payer: Medicare HMO | Source: Ambulatory Visit | Attending: Family Medicine | Admitting: Family Medicine

## 2018-04-14 ENCOUNTER — Encounter (HOSPITAL_COMMUNITY)
Admission: RE | Admit: 2018-04-14 | Discharge: 2018-04-14 | Disposition: A | Discharge status: Home / Self Care | Payer: Medicare HMO | Source: Ambulatory Visit | Attending: Internal Medicine | Admitting: Internal Medicine

## 2018-04-14 ENCOUNTER — Encounter (HOSPITAL_COMMUNITY): Payer: Medicare HMO

## 2018-04-14 DIAGNOSIS — I071 Rheumatic tricuspid insufficiency: Secondary | ICD-10-CM | POA: Insufficient documentation

## 2018-04-14 DIAGNOSIS — D509 Iron deficiency anemia, unspecified: Secondary | ICD-10-CM

## 2018-04-14 DIAGNOSIS — I428 Other cardiomyopathies: Secondary | ICD-10-CM | POA: Insufficient documentation

## 2018-04-14 DIAGNOSIS — B1920 Unspecified viral hepatitis C without hepatic coma: Secondary | ICD-10-CM | POA: Insufficient documentation

## 2018-04-14 DIAGNOSIS — I5042 Chronic combined systolic (congestive) and diastolic (congestive) heart failure: Secondary | ICD-10-CM

## 2018-04-14 DIAGNOSIS — I371 Nonrheumatic pulmonary valve insufficiency: Secondary | ICD-10-CM | POA: Insufficient documentation

## 2018-04-14 DIAGNOSIS — I4891 Unspecified atrial fibrillation: Secondary | ICD-10-CM | POA: Diagnosis not present

## 2018-04-14 DIAGNOSIS — I110 Hypertensive heart disease with heart failure: Secondary | ICD-10-CM | POA: Diagnosis not present

## 2018-04-14 MED ORDER — SODIUM CHLORIDE 0.9 % IV SOLN
510.0000 mg | INTRAVENOUS | Status: DC
  Administered 2018-04-14: 510 mg via INTRAVENOUS
  Filled 2018-04-14: qty 17

## 2018-04-14 NOTE — Progress Notes (Signed)
  Echocardiogram 2D Echocardiogram has been performed.  Dale [name] 04/14/2018, 9:15 AM

## 2018-04-14 NOTE — Discharge Instructions (Signed)

## 2018-04-16 ENCOUNTER — Ambulatory Visit (HOSPITAL_COMMUNITY): Payer: Medicare HMO | Attending: Student

## 2018-04-16 ENCOUNTER — Encounter (HOSPITAL_COMMUNITY): Payer: Medicare HMO

## 2018-04-16 DIAGNOSIS — I5042 Chronic combined systolic (congestive) and diastolic (congestive) heart failure: Secondary | ICD-10-CM

## 2018-04-17 ENCOUNTER — Encounter (HOSPITAL_COMMUNITY): Payer: Self-pay

## 2018-04-17 ENCOUNTER — Telehealth (HOSPITAL_COMMUNITY): Payer: Self-pay

## 2018-04-17 NOTE — Telephone Encounter (Signed)
Pt called no answer voice mail left for pt to call back 

## 2018-04-18 ENCOUNTER — Encounter: Payer: Self-pay | Admitting: Physician Assistant

## 2018-04-18 ENCOUNTER — Encounter (HOSPITAL_COMMUNITY): Payer: Medicare HMO

## 2018-04-21 ENCOUNTER — Encounter (HOSPITAL_COMMUNITY): Payer: Medicare HMO

## 2018-04-21 ENCOUNTER — Telehealth (HOSPITAL_COMMUNITY): Payer: Self-pay

## 2018-04-21 ENCOUNTER — Other Ambulatory Visit (HOSPITAL_COMMUNITY): Payer: Self-pay | Admitting: Internal Medicine

## 2018-04-21 ENCOUNTER — Ambulatory Visit (HOSPITAL_COMMUNITY)
Admission: RE | Admit: 2018-04-21 | Discharge: 2018-04-21 | Disposition: A | Discharge status: Home / Self Care | Payer: Medicare HMO | Source: Ambulatory Visit | Attending: Internal Medicine | Admitting: Internal Medicine

## 2018-04-21 DIAGNOSIS — D509 Iron deficiency anemia, unspecified: Secondary | ICD-10-CM | POA: Insufficient documentation

## 2018-04-21 MED ORDER — SODIUM CHLORIDE 0.9 % IV SOLN
510.0000 mg | INTRAVENOUS | Status: DC
  Administered 2018-04-21: 510 mg via INTRAVENOUS
  Filled 2018-04-21: qty 17

## 2018-04-21 NOTE — Telephone Encounter (Signed)
Pt called no answer voice mail left for pt to call back to get echo results

## 2018-04-21 NOTE — Telephone Encounter (Signed)
Pt called with ECHO results. Pt Verbalized understanding

## 2018-04-23 ENCOUNTER — Encounter (HOSPITAL_COMMUNITY): Payer: Medicare HMO

## 2018-04-25 ENCOUNTER — Other Ambulatory Visit (INDEPENDENT_AMBULATORY_CARE_PROVIDER_SITE_OTHER): Payer: Medicare HMO

## 2018-04-25 ENCOUNTER — Encounter: Payer: Self-pay | Admitting: Physician Assistant

## 2018-04-25 ENCOUNTER — Ambulatory Visit: Payer: Medicare HMO | Admitting: Physician Assistant

## 2018-04-25 ENCOUNTER — Encounter (HOSPITAL_COMMUNITY): Payer: Medicare HMO

## 2018-04-25 VITALS — BP 104/48 | HR 60 | Ht 68.0 in | Wt 141.4 lb

## 2018-04-25 DIAGNOSIS — D509 Iron deficiency anemia, unspecified: Secondary | ICD-10-CM

## 2018-04-25 DIAGNOSIS — K5521 Angiodysplasia of colon with hemorrhage: Secondary | ICD-10-CM

## 2018-04-25 LAB — FERRITIN: Ferritin: 295.7 ng/mL (ref 22.0–322.0)

## 2018-04-25 LAB — CBC WITH DIFFERENTIAL/PLATELET
BASOS ABS: 0 10*3/uL (ref 0.0–0.1)
Basophils Relative: 0.9 % (ref 0.0–3.0)
EOS ABS: 0.3 10*3/uL (ref 0.0–0.7)
Eosinophils Relative: 5.7 % — ABNORMAL HIGH (ref 0.0–5.0)
HEMATOCRIT: 35.5 % — AB (ref 39.0–52.0)
Hemoglobin: 10.8 g/dL — ABNORMAL LOW (ref 13.0–17.0)
LYMPHS PCT: 35.8 % (ref 12.0–46.0)
Lymphs Abs: 1.8 10*3/uL (ref 0.7–4.0)
MCHC: 30.5 g/dL (ref 30.0–36.0)
MCV: 77.3 fl — ABNORMAL LOW (ref 78.0–100.0)
MONOS PCT: 10.7 % (ref 3.0–12.0)
Monocytes Absolute: 0.5 10*3/uL (ref 0.1–1.0)
Neutro Abs: 2.3 10*3/uL (ref 1.4–7.7)
Neutrophils Relative %: 46.9 % (ref 43.0–77.0)
Platelets: 145 10*3/uL — ABNORMAL LOW (ref 150.0–400.0)
RBC: 4.59 Mil/uL (ref 4.22–5.81)
RDW: 28.6 % — ABNORMAL HIGH (ref 11.5–15.5)
WBC: 4.9 10*3/uL (ref 4.0–10.5)

## 2018-04-25 LAB — IBC PANEL
IRON: 274 ug/dL — AB (ref 42–165)
SATURATION RATIOS: 78.9 % — AB (ref 20.0–50.0)
TRANSFERRIN: 248 mg/dL (ref 212.0–360.0)
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
## 2018-04-25 NOTE — Patient Instructions (Signed)
Your provider has requested that you go to the basement level for lab work before leaving today. Press "B" on the elevator. The lab is located at the first door on the left as you exit the elevator.  Continue to follow up with Dr. Gala Romney.   Normal BMI (Body Mass Index- based on height and weight) is between 23 and 30. Your BMI today is Body mass index is 21.5 kg/m. Marland Kitchen Please consider follow up  regarding your BMI with your Primary Care Provider.

## 2018-04-28 ENCOUNTER — Encounter (HOSPITAL_COMMUNITY): Payer: Medicare HMO

## 2018-04-30 ENCOUNTER — Encounter (HOSPITAL_COMMUNITY): Payer: Medicare HMO

## 2018-05-05 ENCOUNTER — Encounter (HOSPITAL_COMMUNITY)
Admission: RE | Admit: 2018-05-05 | Discharge: 2018-05-05 | Disposition: A | Discharge status: Home / Self Care | Payer: Self-pay | Source: Ambulatory Visit | Attending: Internal Medicine | Admitting: Internal Medicine

## 2018-05-05 DIAGNOSIS — I5042 Chronic combined systolic (congestive) and diastolic (congestive) heart failure: Secondary | ICD-10-CM | POA: Insufficient documentation

## 2018-05-07 ENCOUNTER — Encounter (HOSPITAL_COMMUNITY): Payer: Self-pay

## 2018-05-08 ENCOUNTER — Encounter (HOSPITAL_COMMUNITY): Payer: Medicare HMO | Admitting: Internal Medicine

## 2018-05-09 ENCOUNTER — Encounter (HOSPITAL_COMMUNITY): Payer: Self-pay

## 2018-05-12 ENCOUNTER — Other Ambulatory Visit (HOSPITAL_COMMUNITY): Payer: Self-pay | Admitting: Internal Medicine

## 2018-05-12 ENCOUNTER — Encounter (HOSPITAL_COMMUNITY): Payer: Self-pay

## 2018-05-14 ENCOUNTER — Encounter (HOSPITAL_COMMUNITY): Payer: Self-pay

## 2018-05-16 ENCOUNTER — Encounter (HOSPITAL_COMMUNITY): Payer: Self-pay

## 2018-05-19 ENCOUNTER — Encounter (HOSPITAL_COMMUNITY)
Admission: RE | Admit: 2018-05-19 | Discharge: 2018-05-19 | Disposition: A | Discharge status: Home / Self Care | Payer: Self-pay | Source: Ambulatory Visit | Attending: Internal Medicine | Admitting: Internal Medicine

## 2018-05-21 ENCOUNTER — Encounter (HOSPITAL_COMMUNITY): Payer: Self-pay

## 2018-05-23 ENCOUNTER — Encounter (HOSPITAL_COMMUNITY): Payer: Self-pay

## 2018-05-26 ENCOUNTER — Encounter (HOSPITAL_COMMUNITY): Payer: Self-pay

## 2018-05-30 ENCOUNTER — Encounter (HOSPITAL_COMMUNITY): Payer: Self-pay

## 2018-06-02 ENCOUNTER — Encounter (HOSPITAL_COMMUNITY): Payer: Self-pay

## 2018-06-06 ENCOUNTER — Encounter (HOSPITAL_COMMUNITY): Payer: Self-pay

## 2018-06-06 DIAGNOSIS — I5042 Chronic combined systolic (congestive) and diastolic (congestive) heart failure: Secondary | ICD-10-CM | POA: Insufficient documentation

## 2018-06-09 ENCOUNTER — Encounter (HOSPITAL_COMMUNITY): Payer: Self-pay

## 2018-06-11 ENCOUNTER — Encounter (HOSPITAL_COMMUNITY): Payer: Self-pay

## 2018-06-13 ENCOUNTER — Encounter (HOSPITAL_COMMUNITY): Payer: Self-pay

## 2018-06-16 ENCOUNTER — Encounter (HOSPITAL_COMMUNITY): Payer: Self-pay

## 2018-06-18 ENCOUNTER — Encounter (HOSPITAL_COMMUNITY): Payer: Self-pay

## 2018-06-20 ENCOUNTER — Encounter (HOSPITAL_COMMUNITY): Payer: Self-pay

## 2018-06-23 ENCOUNTER — Encounter (HOSPITAL_COMMUNITY): Payer: Self-pay

## 2018-06-25 ENCOUNTER — Encounter (HOSPITAL_COMMUNITY): Payer: Self-pay

## 2018-06-27 ENCOUNTER — Encounter (HOSPITAL_COMMUNITY): Payer: Self-pay

## 2018-06-30 ENCOUNTER — Encounter (HOSPITAL_COMMUNITY): Payer: Self-pay

## 2018-07-02 ENCOUNTER — Encounter (HOSPITAL_COMMUNITY)
Admission: RE | Admit: 2018-07-02 | Discharge: 2018-07-02 | Disposition: A | Discharge status: Home / Self Care | Payer: Self-pay | Source: Ambulatory Visit | Attending: Internal Medicine | Admitting: Internal Medicine

## 2018-07-04 ENCOUNTER — Encounter (HOSPITAL_COMMUNITY): Payer: Self-pay

## 2018-07-07 ENCOUNTER — Encounter (HOSPITAL_COMMUNITY)
Admission: RE | Admit: 2018-07-07 | Discharge: 2018-07-07 | Disposition: A | Discharge status: Home / Self Care | Payer: Self-pay | Source: Ambulatory Visit | Attending: Internal Medicine | Admitting: Internal Medicine

## 2018-07-07 DIAGNOSIS — I5042 Chronic combined systolic (congestive) and diastolic (congestive) heart failure: Secondary | ICD-10-CM | POA: Insufficient documentation

## 2018-07-09 ENCOUNTER — Encounter (HOSPITAL_COMMUNITY): Payer: Self-pay

## 2018-07-11 ENCOUNTER — Encounter (HOSPITAL_COMMUNITY): Payer: Self-pay

## 2018-07-14 ENCOUNTER — Encounter (HOSPITAL_COMMUNITY): Payer: Self-pay

## 2018-07-16 ENCOUNTER — Encounter (HOSPITAL_COMMUNITY): Payer: Self-pay

## 2018-07-17 ENCOUNTER — Other Ambulatory Visit (HOSPITAL_COMMUNITY): Payer: Self-pay | Admitting: Cardiology

## 2018-07-18 ENCOUNTER — Encounter (HOSPITAL_COMMUNITY): Payer: Self-pay

## 2018-07-21 ENCOUNTER — Encounter (HOSPITAL_COMMUNITY): Payer: Self-pay

## 2018-07-23 ENCOUNTER — Encounter (HOSPITAL_COMMUNITY): Payer: Self-pay

## 2018-07-24 ENCOUNTER — Other Ambulatory Visit (HOSPITAL_COMMUNITY): Payer: Self-pay | Admitting: *Deleted

## 2018-07-24 MED ORDER — CARVEDILOL 3.125 MG PO TABS: 3.1250 mg | ORAL_TABLET | Freq: Two times a day (BID) | ORAL | 3 refills | Status: AC

## 2018-07-25 ENCOUNTER — Encounter (HOSPITAL_COMMUNITY): Payer: Self-pay

## 2018-07-28 ENCOUNTER — Encounter (HOSPITAL_COMMUNITY): Payer: Self-pay

## 2018-07-30 ENCOUNTER — Encounter (HOSPITAL_COMMUNITY): Payer: Self-pay

## 2018-08-01 ENCOUNTER — Encounter (HOSPITAL_COMMUNITY): Payer: Self-pay

## 2018-08-04 ENCOUNTER — Encounter (HOSPITAL_COMMUNITY): Payer: Self-pay | Attending: Internal Medicine

## 2018-08-04 DIAGNOSIS — I509 Heart failure, unspecified: Secondary | ICD-10-CM | POA: Insufficient documentation

## 2018-08-06 ENCOUNTER — Encounter (HOSPITAL_COMMUNITY): Payer: Self-pay

## 2018-08-08 ENCOUNTER — Telehealth (HOSPITAL_COMMUNITY): Payer: Self-pay

## 2018-08-08 ENCOUNTER — Encounter (HOSPITAL_COMMUNITY): Payer: Self-pay

## 2018-08-08 NOTE — Telephone Encounter (Signed)
Pt wife called requesting advice on safe over the counter medications that her husband can take for allergies/ stuffy nose. Reviewed the list with patient and advised that he could take claritin, mussinex and nasal spray. Pt wife verbalized understanding and is grateful for advice.

## 2018-08-11 ENCOUNTER — Encounter (HOSPITAL_COMMUNITY): Payer: Self-pay

## 2018-08-13 ENCOUNTER — Encounter (HOSPITAL_COMMUNITY): Payer: Self-pay

## 2018-08-15 ENCOUNTER — Encounter (HOSPITAL_COMMUNITY): Payer: Self-pay

## 2018-08-18 ENCOUNTER — Encounter (HOSPITAL_COMMUNITY): Payer: Self-pay

## 2018-08-18 ENCOUNTER — Telehealth (HOSPITAL_COMMUNITY): Payer: Self-pay | Admitting: *Deleted

## 2018-08-18 NOTE — Telephone Encounter (Signed)
Contacted patient to notify of Cardiac Rehab department closure x2 weeks. Pt verbalized understanding. Katrice Goel, Exercise Physiologist Cardiac and Pulmonary Rehabilitation  

## 2018-08-19 ENCOUNTER — Other Ambulatory Visit (HOSPITAL_COMMUNITY): Payer: Self-pay

## 2018-08-19 MED ORDER — SACUBITRIL-VALSARTAN 24-26 MG PO TABS: 1.0000 | ORAL_TABLET | Freq: Two times a day (BID) | ORAL | 1 refills | Status: DC

## 2018-08-19 MED ORDER — APIXABAN 5 MG PO TABS: 5.0000 mg | ORAL_TABLET | Freq: Two times a day (BID) | ORAL | 3 refills | Status: DC

## 2018-08-19 NOTE — Telephone Encounter (Signed)
Eliquis 5mg  14 day supply lot #[id number] exp JUN 2022 given to pt due to pharmacy back up and out at home

## 2018-08-20 ENCOUNTER — Encounter (HOSPITAL_COMMUNITY): Payer: Self-pay

## 2018-08-22 ENCOUNTER — Encounter (HOSPITAL_COMMUNITY): Payer: Self-pay

## 2018-08-25 ENCOUNTER — Encounter (HOSPITAL_COMMUNITY): Payer: Self-pay

## 2018-08-26 ENCOUNTER — Telehealth (HOSPITAL_COMMUNITY): Payer: Self-pay | Admitting: *Deleted

## 2018-08-26 NOTE — Telephone Encounter (Signed)
Called to notify patient that the cardiac and pulmonary rehabilitation department will be closed for 4 weeks due to COVID-19 restrictions. Pt verbalized understanding. Patient is doing stretching exercises and walking inside in the meantime. Artist Pais, MS, ACSM CEP

## 2018-08-27 ENCOUNTER — Encounter (HOSPITAL_COMMUNITY): Payer: Self-pay

## 2018-08-29 ENCOUNTER — Telehealth (HOSPITAL_COMMUNITY): Payer: Self-pay

## 2018-08-29 ENCOUNTER — Encounter (HOSPITAL_COMMUNITY): Payer: Self-pay

## 2018-08-29 ENCOUNTER — Telehealth (HOSPITAL_COMMUNITY): Payer: Self-pay | Admitting: Cardiology

## 2018-08-29 NOTE — Telephone Encounter (Signed)
Attempted to initiate a prior authorization for entresto 24-26. Prior authorization was not needed as it had been completed and approved until jan 2021. Reference number [id number]

## 2018-08-29 NOTE — Telephone Encounter (Signed)
Returned call to patient/patients wife  advised at this time a letter to return to work should not be required as we did not take him out of work. And at this time we are not taking patients out of work, however we would highly advise patient to practice good hand hygiene and social distancing. Would request to work from home if that is an option.  pts wife voiced understanding. Nothing further needed at this time

## 2018-08-29 NOTE — Telephone Encounter (Signed)
Patients wife called to request a letter stating patient can return to work, reports he was told by his employer to stay home x 14 days given his cardiac history,age, and COVID.    Attempted to return call-no answer and voice mail is full

## 2018-09-01 ENCOUNTER — Encounter (HOSPITAL_COMMUNITY): Payer: Self-pay

## 2018-09-03 ENCOUNTER — Encounter (HOSPITAL_COMMUNITY): Payer: Self-pay

## 2018-09-03 ENCOUNTER — Encounter (HOSPITAL_COMMUNITY): Payer: Self-pay | Admitting: *Deleted

## 2018-09-03 ENCOUNTER — Telehealth (HOSPITAL_COMMUNITY): Payer: Self-pay | Admitting: *Deleted

## 2018-09-03 NOTE — Telephone Encounter (Signed)
Received fax from [employer], [facility], they are assisting pt with getting med assist with his Sherryll Burger and need a letter from our office stating it is medically necessary that the pt take the brand name med.  Letter completed, signed by Dr Gala Romney and faxed back to them at [phone]

## 2018-09-05 ENCOUNTER — Encounter (HOSPITAL_COMMUNITY): Payer: Self-pay

## 2018-09-08 ENCOUNTER — Encounter (HOSPITAL_COMMUNITY): Payer: Self-pay

## 2018-09-10 ENCOUNTER — Encounter (HOSPITAL_COMMUNITY): Payer: Self-pay

## 2018-09-12 ENCOUNTER — Encounter (HOSPITAL_COMMUNITY): Payer: Self-pay

## 2018-09-12 ENCOUNTER — Telehealth (HOSPITAL_COMMUNITY): Payer: Self-pay | Admitting: *Deleted

## 2018-09-12 NOTE — Telephone Encounter (Signed)
Called to notify patient that the cardiac and pulmonary rehabilitation department remains closed at this time due to COVID-19 restrictions. Pt verbalized understanding.Pt is walking at home as his mode of exercise.   Artist Pais, MS, ACSM CEP 09/12/2018 1213

## 2018-09-15 ENCOUNTER — Encounter (HOSPITAL_COMMUNITY): Payer: Self-pay

## 2018-09-17 ENCOUNTER — Encounter (HOSPITAL_COMMUNITY): Payer: Self-pay

## 2018-09-19 ENCOUNTER — Encounter (HOSPITAL_COMMUNITY): Payer: Self-pay

## 2018-09-22 ENCOUNTER — Encounter (HOSPITAL_COMMUNITY): Payer: Self-pay

## 2018-09-24 ENCOUNTER — Encounter (HOSPITAL_COMMUNITY): Payer: Self-pay

## 2018-09-26 ENCOUNTER — Encounter (HOSPITAL_COMMUNITY): Payer: Self-pay

## 2018-09-29 ENCOUNTER — Encounter (HOSPITAL_COMMUNITY): Payer: Self-pay

## 2018-10-01 ENCOUNTER — Encounter (HOSPITAL_COMMUNITY): Payer: Self-pay

## 2018-10-03 ENCOUNTER — Encounter (HOSPITAL_COMMUNITY): Payer: Self-pay

## 2018-10-06 ENCOUNTER — Encounter (HOSPITAL_COMMUNITY): Payer: Self-pay

## 2018-10-08 ENCOUNTER — Encounter (HOSPITAL_COMMUNITY): Payer: Self-pay

## 2018-10-10 ENCOUNTER — Encounter (HOSPITAL_COMMUNITY): Payer: Self-pay

## 2018-10-12 ENCOUNTER — Other Ambulatory Visit (HOSPITAL_COMMUNITY): Payer: Self-pay | Admitting: Internal Medicine

## 2018-10-13 ENCOUNTER — Encounter (HOSPITAL_COMMUNITY): Payer: Self-pay

## 2018-10-15 ENCOUNTER — Encounter (HOSPITAL_COMMUNITY): Payer: Self-pay

## 2018-10-17 ENCOUNTER — Encounter (HOSPITAL_COMMUNITY): Payer: Self-pay

## 2018-10-19 ENCOUNTER — Other Ambulatory Visit (HOSPITAL_COMMUNITY): Payer: Self-pay | Admitting: Internal Medicine

## 2018-10-20 ENCOUNTER — Encounter (HOSPITAL_COMMUNITY): Payer: Self-pay

## 2018-10-22 ENCOUNTER — Encounter (HOSPITAL_COMMUNITY): Payer: Self-pay

## 2018-10-22 ENCOUNTER — Ambulatory Visit (HOSPITAL_COMMUNITY)
Admission: RE | Admit: 2018-10-22 | Discharge: 2018-10-22 | Disposition: A | Discharge status: Home / Self Care | Payer: Medicare HMO | Source: Ambulatory Visit | Attending: Internal Medicine | Admitting: Internal Medicine

## 2018-10-22 ENCOUNTER — Encounter (HOSPITAL_COMMUNITY): Payer: Self-pay | Admitting: *Deleted

## 2018-10-22 ENCOUNTER — Other Ambulatory Visit: Payer: Self-pay

## 2018-10-22 DIAGNOSIS — D509 Iron deficiency anemia, unspecified: Secondary | ICD-10-CM

## 2018-10-22 DIAGNOSIS — I493 Ventricular premature depolarization: Secondary | ICD-10-CM

## 2018-10-22 DIAGNOSIS — I480 Paroxysmal atrial fibrillation: Secondary | ICD-10-CM

## 2018-10-22 DIAGNOSIS — I5022 Chronic systolic (congestive) heart failure: Secondary | ICD-10-CM
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
## 2018-10-22 NOTE — Patient Instructions (Signed)
Labs in 1-2 weeks  Your physician recommends that you schedule a follow-up appointment in: 4 months  Our office will call you to schedule these appointments  If you have any questions or concerns before your next appointment please send [place] a message through [place] or call our office at [phone].

## 2018-10-22 NOTE — Addendum Note (Signed)
Encounter addended by: Noralee Space, RN on: 10/22/2018 3:34 PM  Actions taken: Order list changed, Diagnosis association updated, Clinical Note Signed

## 2018-10-22 NOTE — Progress Notes (Signed)
AVS sent to pt via mychart, orders placed for labs, message sent to schedulers to arrange appointments.

## 2018-10-24 ENCOUNTER — Encounter (HOSPITAL_COMMUNITY): Payer: Self-pay

## 2018-10-29 ENCOUNTER — Encounter (HOSPITAL_COMMUNITY): Payer: Self-pay

## 2018-10-31 ENCOUNTER — Encounter (HOSPITAL_COMMUNITY): Payer: Self-pay

## 2018-11-03 ENCOUNTER — Encounter (HOSPITAL_COMMUNITY): Payer: Self-pay

## 2018-11-05 ENCOUNTER — Encounter (HOSPITAL_COMMUNITY): Payer: Self-pay

## 2018-11-07 ENCOUNTER — Encounter (HOSPITAL_COMMUNITY): Payer: Self-pay

## 2018-11-10 ENCOUNTER — Encounter (HOSPITAL_COMMUNITY): Payer: Self-pay

## 2018-11-12 ENCOUNTER — Encounter (HOSPITAL_COMMUNITY): Payer: Self-pay

## 2018-11-14 ENCOUNTER — Encounter (HOSPITAL_COMMUNITY): Payer: Self-pay

## 2018-11-17 ENCOUNTER — Encounter (HOSPITAL_COMMUNITY): Payer: Self-pay

## 2018-11-19 ENCOUNTER — Encounter (HOSPITAL_COMMUNITY): Payer: Self-pay

## 2018-11-21 ENCOUNTER — Encounter (HOSPITAL_COMMUNITY): Payer: Self-pay

## 2018-11-24 ENCOUNTER — Encounter (HOSPITAL_COMMUNITY): Payer: Self-pay

## 2018-11-26 ENCOUNTER — Encounter (HOSPITAL_COMMUNITY): Payer: Self-pay

## 2018-11-28 ENCOUNTER — Encounter (HOSPITAL_COMMUNITY): Payer: Self-pay

## 2018-12-01 ENCOUNTER — Encounter (HOSPITAL_COMMUNITY): Payer: Self-pay

## 2018-12-03 ENCOUNTER — Encounter (HOSPITAL_COMMUNITY): Payer: Self-pay

## 2018-12-03 ENCOUNTER — Telehealth (HOSPITAL_COMMUNITY): Payer: Self-pay | Admitting: *Deleted

## 2018-12-03 NOTE — Telephone Encounter (Signed)
Called patient to update on the status of the maintenance Cardiac Rehab exercise classes.  At this point they remain on hold due to the COVID-19 pandemic precautions.

## 2018-12-08 ENCOUNTER — Encounter (HOSPITAL_COMMUNITY): Payer: Self-pay

## 2018-12-10 ENCOUNTER — Encounter (HOSPITAL_COMMUNITY): Payer: Self-pay

## 2018-12-12 ENCOUNTER — Encounter (HOSPITAL_COMMUNITY): Payer: Self-pay

## 2018-12-15 ENCOUNTER — Encounter (HOSPITAL_COMMUNITY): Payer: Self-pay

## 2018-12-17 ENCOUNTER — Encounter (HOSPITAL_COMMUNITY): Payer: Self-pay

## 2018-12-19 ENCOUNTER — Encounter (HOSPITAL_COMMUNITY): Payer: Self-pay

## 2018-12-22 ENCOUNTER — Encounter (HOSPITAL_COMMUNITY): Payer: Self-pay

## 2018-12-24 ENCOUNTER — Encounter (HOSPITAL_COMMUNITY): Payer: Self-pay

## 2018-12-26 ENCOUNTER — Encounter (HOSPITAL_COMMUNITY): Payer: Self-pay

## 2018-12-29 ENCOUNTER — Encounter (HOSPITAL_COMMUNITY): Payer: Self-pay

## 2018-12-31 ENCOUNTER — Encounter (HOSPITAL_COMMUNITY): Payer: Self-pay

## 2019-01-02 ENCOUNTER — Encounter (HOSPITAL_COMMUNITY): Payer: Self-pay

## 2019-01-05 ENCOUNTER — Encounter (HOSPITAL_COMMUNITY): Payer: Self-pay

## 2019-01-07 ENCOUNTER — Encounter (HOSPITAL_COMMUNITY): Payer: Self-pay

## 2019-01-09 ENCOUNTER — Encounter (HOSPITAL_COMMUNITY): Payer: Self-pay

## 2019-01-12 ENCOUNTER — Encounter (HOSPITAL_COMMUNITY): Payer: Self-pay

## 2019-01-14 ENCOUNTER — Encounter (HOSPITAL_COMMUNITY): Payer: Self-pay

## 2019-01-16 ENCOUNTER — Encounter (HOSPITAL_COMMUNITY): Payer: Self-pay

## 2019-01-19 ENCOUNTER — Encounter (HOSPITAL_COMMUNITY): Payer: Self-pay

## 2019-01-21 ENCOUNTER — Encounter (HOSPITAL_COMMUNITY): Payer: Self-pay

## 2019-01-23 ENCOUNTER — Encounter (HOSPITAL_COMMUNITY): Payer: Self-pay

## 2019-01-26 ENCOUNTER — Encounter (HOSPITAL_COMMUNITY): Payer: Self-pay

## 2019-01-28 ENCOUNTER — Encounter (HOSPITAL_COMMUNITY): Payer: Self-pay

## 2019-01-30 ENCOUNTER — Encounter (HOSPITAL_COMMUNITY): Payer: Self-pay

## 2019-02-02 ENCOUNTER — Encounter (HOSPITAL_COMMUNITY): Payer: Self-pay

## 2019-02-16 ENCOUNTER — Other Ambulatory Visit (HOSPITAL_COMMUNITY): Payer: Self-pay | Admitting: *Deleted

## 2019-02-16 ENCOUNTER — Other Ambulatory Visit (HOSPITAL_COMMUNITY): Payer: Self-pay | Admitting: Internal Medicine

## 2019-02-16 MED ORDER — ENTRESTO 24-26 MG PO TABS: 1.0000 | ORAL_TABLET | Freq: Two times a day (BID) | ORAL | 1 refills | Status: AC

## 2019-02-17 ENCOUNTER — Other Ambulatory Visit (HOSPITAL_COMMUNITY): Payer: Self-pay

## 2019-02-17 NOTE — Telephone Encounter (Signed)
Pt aware rx was sent to pharmacy 

## 2019-02-17 NOTE — Telephone Encounter (Signed)
error 

## 2019-02-18 ENCOUNTER — Telehealth: Payer: Self-pay | Admitting: Physician Assistant

## 2019-02-18 NOTE — Telephone Encounter (Signed)
Paged by EMS service as patient is dead at home. Will send information to Dr. Haroldine Laws tomorrow to sign paperwork.

## 2019-02-19 ENCOUNTER — Telehealth (HOSPITAL_COMMUNITY): Payer: Self-pay

## 2019-02-19 NOTE — Telephone Encounter (Signed)
Received death certificate. Dr. Haroldine Laws signed it. Faxed to [facility] and called them. They said they would pick it up 02/20/19 at the front desk.

## 2019-03-04 ENCOUNTER — Encounter (HOSPITAL_COMMUNITY): Payer: Medicare HMO | Admitting: Internal Medicine

## 2019-03-05 DEATH — deceased
